# Patient Record
Sex: Male | Born: 1945 | Race: White | Hispanic: No | Smoking: Former smoker
Health system: Southern US, Community
[De-identification: ages and names within clinical notes are randomized; demographics above are authoritative.]

## PROBLEM LIST (undated history)

## (undated) DIAGNOSIS — I4891 Unspecified atrial fibrillation: Secondary | ICD-10-CM

## (undated) DIAGNOSIS — S065X9A Traumatic subdural hemorrhage with loss of consciousness of unspecified duration, initial encounter: Secondary | ICD-10-CM

## (undated) DIAGNOSIS — I639 Cerebral infarction, unspecified: Secondary | ICD-10-CM

## (undated) DIAGNOSIS — I10 Essential (primary) hypertension: Secondary | ICD-10-CM

## (undated) DIAGNOSIS — I609 Nontraumatic subarachnoid hemorrhage, unspecified: Secondary | ICD-10-CM

## (undated) DIAGNOSIS — M1990 Unspecified osteoarthritis, unspecified site: Secondary | ICD-10-CM

## (undated) DIAGNOSIS — R413 Other amnesia: Secondary | ICD-10-CM

## (undated) DIAGNOSIS — K219 Gastro-esophageal reflux disease without esophagitis: Secondary | ICD-10-CM

## (undated) DIAGNOSIS — U071 COVID-19: Secondary | ICD-10-CM

## (undated) DIAGNOSIS — I4892 Unspecified atrial flutter: Secondary | ICD-10-CM

## (undated) DIAGNOSIS — Z8619 Personal history of other infectious and parasitic diseases: Secondary | ICD-10-CM

## (undated) DIAGNOSIS — S065XAA Traumatic subdural hemorrhage with loss of consciousness status unknown, initial encounter: Secondary | ICD-10-CM

## (undated) DIAGNOSIS — E785 Hyperlipidemia, unspecified: Secondary | ICD-10-CM

## (undated) HISTORY — PX: APPENDECTOMY: SHX54

## (undated) HISTORY — DX: Unspecified osteoarthritis, unspecified site: M19.90

## (undated) HISTORY — DX: Hyperlipidemia, unspecified: E78.5

## (undated) HISTORY — DX: Other amnesia: R41.3

## (undated) HISTORY — PX: OTHER SURGICAL HISTORY: SHX169

## (undated) HISTORY — DX: Cerebral infarction, unspecified: I63.9

## (undated) HISTORY — DX: Personal history of other infectious and parasitic diseases: Z86.19

## (undated) HISTORY — DX: COVID-19: U07.1

## (undated) HISTORY — DX: Unspecified atrial fibrillation: I48.91

## (undated) HISTORY — DX: Gastro-esophageal reflux disease without esophagitis: K21.9

## (undated) HISTORY — DX: Essential (primary) hypertension: I10

---

## 2018-12-07 ENCOUNTER — Ambulatory Visit (INDEPENDENT_AMBULATORY_CARE_PROVIDER_SITE_OTHER): Payer: Medicare HMO | Admitting: Internal Medicine

## 2018-12-07 VITALS — BP 140/80 | HR 67 | Temp 97.6°F | Ht 66.0 in | Wt 198.8 lb

## 2018-12-07 DIAGNOSIS — I639 Cerebral infarction, unspecified: Secondary | ICD-10-CM | POA: Diagnosis not present

## 2018-12-07 DIAGNOSIS — E559 Vitamin D deficiency, unspecified: Secondary | ICD-10-CM | POA: Diagnosis not present

## 2018-12-07 DIAGNOSIS — Z125 Encounter for screening for malignant neoplasm of prostate: Secondary | ICD-10-CM | POA: Diagnosis not present

## 2018-12-07 DIAGNOSIS — Z1329 Encounter for screening for other suspected endocrine disorder: Secondary | ICD-10-CM

## 2018-12-07 DIAGNOSIS — I4891 Unspecified atrial fibrillation: Secondary | ICD-10-CM

## 2018-12-07 DIAGNOSIS — E785 Hyperlipidemia, unspecified: Secondary | ICD-10-CM

## 2018-12-07 DIAGNOSIS — I10 Essential (primary) hypertension: Secondary | ICD-10-CM

## 2018-12-07 DIAGNOSIS — Z1389 Encounter for screening for other disorder: Secondary | ICD-10-CM

## 2018-12-07 DIAGNOSIS — R413 Other amnesia: Secondary | ICD-10-CM

## 2018-12-07 DIAGNOSIS — Z1159 Encounter for screening for other viral diseases: Secondary | ICD-10-CM

## 2018-12-07 DIAGNOSIS — E538 Deficiency of other specified B group vitamins: Secondary | ICD-10-CM

## 2018-12-07 DIAGNOSIS — Z13818 Encounter for screening for other digestive system disorders: Secondary | ICD-10-CM

## 2018-12-07 MED ORDER — APIXABAN 5 MG PO TABS: 5.0000 mg | ORAL_TABLET | Freq: Two times a day (BID) | ORAL | 1 refills | Status: DC

## 2018-12-07 MED ORDER — LISINOPRIL 10 MG PO TABS: 10.0000 mg | ORAL_TABLET | Freq: Every day | ORAL | 1 refills | Status: DC

## 2018-12-07 MED ORDER — CARVEDILOL 25 MG PO TABS: 25.0000 mg | ORAL_TABLET | Freq: Two times a day (BID) | ORAL | 1 refills | Status: DC

## 2018-12-07 MED ORDER — ATORVASTATIN CALCIUM 10 MG PO TABS: 10.0000 mg | ORAL_TABLET | Freq: Every day | ORAL | 3 refills | Status: DC

## 2018-12-07 MED ORDER — FLECAINIDE ACETATE 100 MG PO TABS: 100.0000 mg | ORAL_TABLET | Freq: Two times a day (BID) | ORAL | 1 refills | Status: DC

## 2018-12-07 NOTE — Patient Instructions (Signed)
DASH Eating Plan  DASH stands for "Dietary Approaches to Stop Hypertension." The DASH eating plan is a healthy eating plan that has been shown to reduce high blood pressure (hypertension). It may also reduce your risk for type 2 diabetes, heart disease, and stroke. The DASH eating plan may also help with weight loss.  What are tips for following this plan?    General guidelines  · Avoid eating more than 2,300 mg (milligrams) of salt (sodium) a day. If you have hypertension, you may need to reduce your sodium intake to 1,500 mg a day.  · Limit alcohol intake to no more than 1 drink a day for nonpregnant women and 2 drinks a day for men. One drink equals 12 oz of beer, 5 oz of wine, or 1½ oz of hard liquor.  · Work with your health care provider to maintain a healthy body weight or to lose weight. Ask what an ideal weight is for you.  · Get at least 30 minutes of exercise that causes your heart to beat faster (aerobic exercise) most days of the week. Activities may include walking, swimming, or biking.  · Work with your health care provider or diet and nutrition specialist (dietitian) to adjust your eating plan to your individual calorie needs.  Reading food labels    · Check food labels for the amount of sodium per serving. Choose foods with less than 5 percent of the Daily Value of sodium. Generally, foods with less than 300 mg of sodium per serving fit into this eating plan.  · To find whole grains, look for the word "whole" as the first word in the ingredient list.  Shopping  · Buy products labeled as "low-sodium" or "no salt added."  · Buy fresh foods. Avoid canned foods and premade or frozen meals.  Cooking  · Avoid adding salt when cooking. Use salt-free seasonings or herbs instead of table salt or sea salt. Check with your health care provider or pharmacist before using salt substitutes.  · Do not fry foods. Cook foods using healthy methods such as baking, boiling, grilling, and broiling instead.  · Cook with  heart-healthy oils, such as olive, canola, soybean, or sunflower oil.  Meal planning  · Eat a balanced diet that includes:  ? 5 or more servings of fruits and vegetables each day. At each meal, try to fill half of your plate with fruits and vegetables.  ? Up to 6-8 servings of whole grains each day.  ? Less than 6 oz of lean meat, poultry, or fish each day. A 3-oz serving of meat is about the same size as a deck of cards. One egg equals 1 oz.  ? 2 servings of low-fat dairy each day.  ? A serving of nuts, seeds, or beans 5 times each week.  ? Heart-healthy fats. Healthy fats called Omega-3 fatty acids are found in foods such as flaxseeds and coldwater fish, like sardines, salmon, and mackerel.  · Limit how much you eat of the following:  ? Canned or prepackaged foods.  ? Food that is high in trans fat, such as fried foods.  ? Food that is high in saturated fat, such as fatty meat.  ? Sweets, desserts, sugary drinks, and other foods with added sugar.  ? Full-fat dairy products.  · Do not salt foods before eating.  · Try to eat at least 2 vegetarian meals each week.  · Eat more home-cooked food and less restaurant, buffet, and fast food.  ·   When eating at a restaurant, ask that your food be prepared with less salt or no salt, if possible.  What foods are recommended?  The items listed may not be a complete list. Talk with your dietitian about what dietary choices are best for you.  Grains  Whole-grain or whole-wheat bread. Whole-grain or whole-wheat pasta. Brown rice. Oatmeal. Quinoa. Bulgur. Whole-grain and low-sodium cereals. Pita bread. Low-fat, low-sodium crackers. Whole-wheat flour tortillas.  Vegetables  Fresh or frozen vegetables (raw, steamed, roasted, or grilled). Low-sodium or reduced-sodium tomato and vegetable juice. Low-sodium or reduced-sodium tomato sauce and tomato paste. Low-sodium or reduced-sodium canned vegetables.  Fruits  All fresh, dried, or frozen fruit. Canned fruit in natural juice (without  added sugar).  Meat and other protein foods  Skinless chicken or turkey. Ground chicken or turkey. Pork with fat trimmed off. Fish and seafood. Egg whites. Dried beans, peas, or lentils. Unsalted nuts, nut butters, and seeds. Unsalted canned beans. Lean cuts of beef with fat trimmed off. Low-sodium, lean deli meat.  Dairy  Low-fat (1%) or fat-free (skim) milk. Fat-free, low-fat, or reduced-fat cheeses. Nonfat, low-sodium ricotta or cottage cheese. Low-fat or nonfat yogurt. Low-fat, low-sodium cheese.  Fats and oils  Soft margarine without trans fats. Vegetable oil. Low-fat, reduced-fat, or light mayonnaise and salad dressings (reduced-sodium). Canola, safflower, olive, soybean, and sunflower oils. Avocado.  Seasoning and other foods  Herbs. Spices. Seasoning mixes without salt. Unsalted popcorn and pretzels. Fat-free sweets.  What foods are not recommended?  The items listed may not be a complete list. Talk with your dietitian about what dietary choices are best for you.  Grains  Baked goods made with fat, such as croissants, muffins, or some breads. Dry pasta or rice meal packs.  Vegetables  Creamed or fried vegetables. Vegetables in a cheese sauce. Regular canned vegetables (not low-sodium or reduced-sodium). Regular canned tomato sauce and paste (not low-sodium or reduced-sodium). Regular tomato and vegetable juice (not low-sodium or reduced-sodium). Pickles. Olives.  Fruits  Canned fruit in a light or heavy syrup. Fried fruit. Fruit in cream or butter sauce.  Meat and other protein foods  Fatty cuts of meat. Ribs. Fried meat. Bacon. Sausage. Bologna and other processed lunch meats. Salami. Fatback. Hotdogs. Bratwurst. Salted nuts and seeds. Canned beans with added salt. Canned or smoked fish. Whole eggs or egg yolks. Chicken or turkey with skin.  Dairy  Whole or 2% milk, cream, and half-and-half. Whole or full-fat cream cheese. Whole-fat or sweetened yogurt. Full-fat cheese. Nondairy creamers. Whipped toppings.  Processed cheese and cheese spreads.  Fats and oils  Butter. Stick margarine. Lard. Shortening. Ghee. Bacon fat. Tropical oils, such as coconut, palm kernel, or palm oil.  Seasoning and other foods  Salted popcorn and pretzels. Onion salt, garlic salt, seasoned salt, table salt, and sea salt. Worcestershire sauce. Tartar sauce. Barbecue sauce. Teriyaki sauce. Soy sauce, including reduced-sodium. Steak sauce. Canned and packaged gravies. Fish sauce. Oyster sauce. Cocktail sauce. Horseradish that you find on the shelf. Ketchup. Mustard. Meat flavorings and tenderizers. Bouillon cubes. Hot sauce and Tabasco sauce. Premade or packaged marinades. Premade or packaged taco seasonings. Relishes. Regular salad dressings.  Where to find more information:  · National Heart, Lung, and Blood Institute: www.nhlbi.nih.gov  · American Heart Association: www.heart.org  Summary  · The DASH eating plan is a healthy eating plan that has been shown to reduce high blood pressure (hypertension). It may also reduce your risk for type 2 diabetes, heart disease, and stroke.  · With the   DASH eating plan, you should limit salt (sodium) intake to 2,300 mg a day. If you have hypertension, you may need to reduce your sodium intake to 1,500 mg a day.  · When on the DASH eating plan, aim to eat more fresh fruits and vegetables, whole grains, lean proteins, low-fat dairy, and heart-healthy fats.  · Work with your health care provider or diet and nutrition specialist (dietitian) to adjust your eating plan to your individual calorie needs.  This information is not intended to replace advice given to you by your health care provider. Make sure you discuss any questions you have with your health care provider.  Document Released: 10/15/2011 Document Revised: 10/19/2016 Document Reviewed: 10/19/2016  Elsevier Interactive Patient Education © 2019 Elsevier Inc.        Hypertension  Hypertension, commonly called high blood pressure, is when the force of  blood pumping through the arteries is too strong. The arteries are the blood vessels that carry blood from the heart throughout the body. Hypertension forces the heart to work harder to pump blood and may cause arteries to become narrow or stiff. Having untreated or uncontrolled hypertension can cause heart attacks, strokes, kidney disease, and other problems.  A blood pressure reading consists of a higher number over a lower number. Ideally, your blood pressure should be below 120/80. The first ("top") number is called the systolic pressure. It is a measure of the pressure in your arteries as your heart beats. The second ("bottom") number is called the diastolic pressure. It is a measure of the pressure in your arteries as the heart relaxes.  What are the causes?  The cause of this condition is not known.  What increases the risk?  Some risk factors for high blood pressure are under your control. Others are not.  Factors you can change  · Smoking.  · Having type 2 diabetes mellitus, high cholesterol, or both.  · Not getting enough exercise or physical activity.  · Being overweight.  · Having too much fat, sugar, calories, or salt (sodium) in your diet.  · Drinking too much alcohol.  Factors that are difficult or impossible to change  · Having chronic kidney disease.  · Having a family history of high blood pressure.  · Age. Risk increases with age.  · Race. You may be at higher risk if you are African-American.  · Gender. Men are at higher risk than women before age 45. After age 65, women are at higher risk than men.  · Having obstructive sleep apnea.  · Stress.  What are the signs or symptoms?  Extremely high blood pressure (hypertensive crisis) may cause:  · Headache.  · Anxiety.  · Shortness of breath.  · Nosebleed.  · Nausea and vomiting.  · Severe chest pain.  · Jerky movements you cannot control (seizures).  How is this diagnosed?  This condition is diagnosed by measuring your blood pressure while you are  seated, with your arm resting on a surface. The cuff of the blood pressure monitor will be placed directly against the skin of your upper arm at the level of your heart. It should be measured at least twice using the same arm. Certain conditions can cause a difference in blood pressure between your right and left arms.  Certain factors can cause blood pressure readings to be lower or higher than normal (elevated) for a short period of time:  · When your blood pressure is higher when you are in a health care   provider's office than when you are at home, this is called white coat hypertension. Most people with this condition do not need medicines.  · When your blood pressure is higher at home than when you are in a health care provider's office, this is called masked hypertension. Most people with this condition may need medicines to control blood pressure.  If you have a high blood pressure reading during one visit or you have normal blood pressure with other risk factors:  · You may be asked to return on a different day to have your blood pressure checked again.  · You may be asked to monitor your blood pressure at home for 1 week or longer.  If you are diagnosed with hypertension, you may have other blood or imaging tests to help your health care provider understand your overall risk for other conditions.  How is this treated?  This condition is treated by making healthy lifestyle changes, such as eating healthy foods, exercising more, and reducing your alcohol intake. Your health care provider may prescribe medicine if lifestyle changes are not enough to get your blood pressure under control, and if:  · Your systolic blood pressure is above 130.  · Your diastolic blood pressure is above 80.  Your personal target blood pressure may vary depending on your medical conditions, your age, and other factors.  Follow these instructions at home:  Eating and drinking    · Eat a diet that is high in fiber and potassium, and  low in sodium, added sugar, and fat. An example eating plan is called the DASH (Dietary Approaches to Stop Hypertension) diet. To eat this way:  ? Eat plenty of fresh fruits and vegetables. Try to fill half of your plate at each meal with fruits and vegetables.  ? Eat whole grains, such as whole wheat pasta, brown rice, or whole grain bread. Fill about one quarter of your plate with whole grains.  ? Eat or drink low-fat dairy products, such as skim milk or low-fat yogurt.  ? Avoid fatty cuts of meat, processed or cured meats, and poultry with skin. Fill about one quarter of your plate with lean proteins, such as fish, chicken without skin, beans, eggs, and tofu.  ? Avoid premade and processed foods. These tend to be higher in sodium, added sugar, and fat.  · Reduce your daily sodium intake. Most people with hypertension should eat less than 1,500 mg of sodium a day.  · Limit alcohol intake to no more than 1 drink a day for nonpregnant women and 2 drinks a day for men. One drink equals 12 oz of beer, 5 oz of wine, or 1½ oz of hard liquor.  Lifestyle    · Work with your health care provider to maintain a healthy body weight or to lose weight. Ask what an ideal weight is for you.  · Get at least 30 minutes of exercise that causes your heart to beat faster (aerobic exercise) most days of the week. Activities may include walking, swimming, or biking.  · Include exercise to strengthen your muscles (resistance exercise), such as pilates or lifting weights, as part of your weekly exercise routine. Try to do these types of exercises for 30 minutes at least 3 days a week.  · Do not use any products that contain nicotine or tobacco, such as cigarettes and e-cigarettes. If you need help quitting, ask your health care provider.  · Monitor your blood pressure at home as told by your health   care provider.  · Keep all follow-up visits as told by your health care provider. This is important.  Medicines  · Take over-the-counter and  prescription medicines only as told by your health care provider. Follow directions carefully. Blood pressure medicines must be taken as prescribed.  · Do not skip doses of blood pressure medicine. Doing this puts you at risk for problems and can make the medicine less effective.  · Ask your health care provider about side effects or reactions to medicines that you should watch for.  Contact a health care provider if:  · You think you are having a reaction to a medicine you are taking.  · You have headaches that keep coming back (recurring).  · You feel dizzy.  · You have swelling in your ankles.  · You have trouble with your vision.  Get help right away if:  · You develop a severe headache or confusion.  · You have unusual weakness or numbness.  · You feel faint.  · You have severe pain in your chest or abdomen.  · You vomit repeatedly.  · You have trouble breathing.  Summary  · Hypertension is when the force of blood pumping through your arteries is too strong. If this condition is not controlled, it may put you at risk for serious complications.  · Your personal target blood pressure may vary depending on your medical conditions, your age, and other factors. For most people, a normal blood pressure is less than 120/80.  · Hypertension is treated with lifestyle changes, medicines, or a combination of both. Lifestyle changes include weight loss, eating a healthy, low-sodium diet, exercising more, and limiting alcohol.  This information is not intended to replace advice given to you by your health care provider. Make sure you discuss any questions you have with your health care provider.  Document Released: 10/26/2005 Document Revised: 09/23/2016 Document Reviewed: 09/23/2016  Elsevier Interactive Patient Education © 2019 Elsevier Inc.

## 2018-12-07 NOTE — Progress Notes (Signed)
Pre visit review using our clinic review tool, if applicable. No additional management support is needed unless otherwise documented below in the visit note. 

## 2018-12-07 NOTE — Progress Notes (Addendum)
Chief Complaint  Patient presents with  . Establish Care   [facility] no complaints today   1. Afib rate controlled pending appt cards   2. HTN/HLD on medications controlled   apixaban 5 MG Tabs tablet Commonly known as:  ELIQUIS Take 1 tablet (5 mg total) by mouth 2 (two) times daily.   atorvastatin 10 MG tablet Commonly known as:  LIPITOR Take 1 tablet (10 mg total) by mouth daily.   carvedilol 25 MG tablet Commonly known as:  COREG Take 1 tablet (25 mg total) by mouth 2 (two) times daily with a meal.   flecainide 100 MG tablet Commonly known as:  TAMBOCOR Take 1 tablet (100 mg total) by mouth 2 (two) times daily.   lisinopril 10 MG tablet Commonly known as:  PRINIVIL,ZESTRIL Take 1 tablet (10 mg total) by mouth daily.       3. H/o stroke does not currently f/u with neurology   Review of Systems  Constitutional: Negative for weight loss.  HENT: Negative for hearing loss.   Eyes: Negative for blurred vision.  Respiratory: Negative for shortness of breath.   Cardiovascular: Negative for chest pain.  Gastrointestinal: Negative for abdominal pain.  Musculoskeletal: Negative for falls.  Skin: Negative for rash.  Neurological: Negative for headaches.  Psychiatric/Behavioral: Negative for depression.   Past Medical History:  Diagnosis Date  . A-fib (HCC)   . Arthritis   . GERD (gastroesophageal reflux disease)   . History of chicken pox   . Hyperlipidemia   . Hypertension   . Loss of memory   . Stroke [facility](HCC)    ~2013/14   Past Surgical History:  Procedure Laterality Date  . APPENDECTOMY     Family History  Problem Relation Age of Onset  . Dementia Mother   . Cancer Father        lung smoker    Social History   Socioeconomic History  . Marital status: Widowed    Spouse name: Not on file  . Number of children: Not on file  . Years of education: Not on file  . Highest education level: Not on file  Occupational History  . Not on  file  Social Needs  . Financial resource strain: Not on file  . Food insecurity:    Worry: Not on file    Inability: Not on file  . Transportation needs:    Medical: Not on file    Non-medical: Not on file  Tobacco Use  . Smoking status: Former Games developermoker  . Smokeless tobacco: Never Used  Substance and Sexual Activity  . Alcohol use: Yes  . Drug use: Not Currently  . Sexual activity: Not on file  Lifestyle  . Physical activity:    Days per week: Not on file    Minutes per session: Not on file  . Stress: Not on file  Relationships  . Social connections:    Talks on phone: Not on file    Gets together: Not on file    Attends religious service: Not on file    Active member of club or organization: Not on file    Attends meetings of clubs or organizations: Not on file    Relationship status: Not on file  . Intimate partner violence:    Fear of current or ex partner: Not on file    Emotionally abused: Not on file    Physically abused: Not on file    Forced sexual activity: Not on file  Other Topics Concern  . Not on file  Social History Narrative   From [place]    Retired    Some college    Moved from [place] to be with son and grandkids    Widowed since ~2013/14    Owns guns    Wears seat belt    Safe in relationship    Current Meds  Medication Sig  . apixaban (ELIQUIS) 5 MG TABS tablet Take 1 tablet (5 mg total) by mouth 2 (two) times daily.  Marland Kitchen atorvastatin (LIPITOR) 10 MG tablet Take 1 tablet (10 mg total) by mouth daily.  . carvedilol (COREG) 25 MG tablet Take 1 tablet (25 mg total) by mouth 2 (two) times daily with a meal.  . flecainide (TAMBOCOR) 100 MG tablet Take 1 tablet (100 mg total) by mouth 2 (two) times daily.  Marland Kitchen lisinopril (PRINIVIL,ZESTRIL) 10 MG tablet Take 1 tablet (10 mg total) by mouth daily.  . [DISCONTINUED] apixaban (ELIQUIS) 5 MG TABS tablet Take by mouth 2 (two) times daily.   . [DISCONTINUED] atorvastatin (LIPITOR) 10 MG tablet Take 10 mg by  mouth daily.   . [DISCONTINUED] carvedilol (COREG) 25 MG tablet Take 25 mg by mouth 2 (two) times daily with a meal.   . [DISCONTINUED] flecainide (TAMBOCOR) 100 MG tablet Take 100 mg by mouth daily.   . [DISCONTINUED] lisinopril (PRINIVIL,ZESTRIL) 10 MG tablet Take 10 mg by mouth daily.    Not on File No results found for this or any previous visit (from the past 2160 hour(s)). Objective  Body mass index is 32.09 kg/m. Wt Readings from Last 3 Encounters:  12/07/18 198 lb 12.8 oz (90.2 kg)   Temp Readings from Last 3 Encounters:  12/07/18 97.6 F (36.4 C) (Oral)   BP Readings from Last 3 Encounters:  12/07/18 140/80   Pulse Readings from Last 3 Encounters:  12/07/18 67    Physical Exam Vitals signs and nursing note reviewed.  Constitutional:      Appearance: Normal appearance. He is well-developed and well-groomed. He is obese.  HENT:     Head: Normocephalic and atraumatic.     Nose: Nose normal.     Mouth/Throat:     Mouth: Mucous membranes are moist.     Pharynx: Oropharynx is clear.  Eyes:     Conjunctiva/sclera: Conjunctivae normal.     Pupils: Pupils are equal, round, and reactive to light.  Cardiovascular:     Rate and Rhythm: Normal rate and regular rhythm.     Heart sounds: Normal heart sounds. No murmur.     Comments: NSR today   Pulmonary:     Effort: Pulmonary effort is normal.     Breath sounds: Normal breath sounds.  Skin:    General: Skin is warm and dry.  Neurological:     General: No focal deficit present.     Mental Status: He is alert and oriented to person, place, and time. Mental status is at baseline.     Gait: Gait normal.  Psychiatric:        Attention and Perception: Attention and perception normal.        Mood and Affect: Mood and affect normal.        Speech: Speech normal.        Behavior: Behavior normal. Behavior is cooperative.        Thought Content: Thought content normal.        Cognition and Memory: Cognition and memory  normal.  Judgment: Judgment normal.     Assessment   1. 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 HTN/HLD  3. HM 4. H/o stroke with memory loss ? Related  Plan   1. F/u Dr. Mariah Milling upcoming 01/16/2019  2. Check fasting labs  He has another he takes prn not sure name of it Reviewed note 08/23/18 with Dr. Meta Hatchet II Day (PCP) he was on norvasc 5 mg qd  3.  Flu shot had [facility] in [place] 08/2018 per pt  Need to check pcp records :  Tdap Prevnar, pna 23 08/09/13  shingrix   Check fasting labs  Former smoker quit 17 years from 11/2018 smoked x 20 years max 1 ppd  Check PSA elevated with h/o elevation PSA was 5.6 08/2016, PSA 3.480 02/09/18  H/o prediabetes A1C 5.33 02/09/18  H/o human rhinovirus/enterovirus 08/23/18  Get records Dr. Meta Hatchet Day [place] PCP and Dr. Doyne Keel [phone] cardiology [place] [facility]  Refilled all medications  4. Consider neurology in future  Carotid US 10/20/17 negative no significant stenosis b/l  Transcranial doppler 09/21/17 patency of major basal intracranial arteries in circle of [place] no evidence of intracranial stenosis or occlusive disease  [place] 02/09/18 negative b/l legs  10/20/17 thyroid US unremarkable  Provider: Dr. French Ana McLean-Scocuzza-[facility] Medicine

## 2018-12-08 ENCOUNTER — Encounter: Payer: Self-pay | Admitting: Internal Medicine

## 2018-12-08 DIAGNOSIS — R413 Other amnesia: Secondary | ICD-10-CM | POA: Insufficient documentation

## 2018-12-08 DIAGNOSIS — I10 Essential (primary) hypertension: Secondary | ICD-10-CM | POA: Insufficient documentation

## 2018-12-08 DIAGNOSIS — I639 Cerebral infarction, unspecified: Secondary | ICD-10-CM | POA: Insufficient documentation

## 2018-12-08 DIAGNOSIS — I4891 Unspecified atrial fibrillation: Secondary | ICD-10-CM | POA: Insufficient documentation

## 2018-12-08 DIAGNOSIS — E785 Hyperlipidemia, unspecified: Secondary | ICD-10-CM | POA: Insufficient documentation

## 2018-12-08 HISTORY — DX: Other amnesia: R41.3

## 2018-12-08 HISTORY — DX: Cerebral infarction, unspecified: I63.9

## 2018-12-09 ENCOUNTER — Encounter: Payer: Self-pay | Admitting: Internal Medicine

## 2018-12-09 DIAGNOSIS — I4891 Unspecified atrial fibrillation: Secondary | ICD-10-CM

## 2018-12-09 DIAGNOSIS — I639 Cerebral infarction, unspecified: Secondary | ICD-10-CM

## 2018-12-09 DIAGNOSIS — I10 Essential (primary) hypertension: Secondary | ICD-10-CM

## 2018-12-09 MED ORDER — ATORVASTATIN CALCIUM 10 MG PO TABS: 10.0000 mg | ORAL_TABLET | Freq: Every day | ORAL | 3 refills | Status: DC

## 2018-12-09 MED ORDER — LISINOPRIL 10 MG PO TABS: 10.0000 mg | ORAL_TABLET | Freq: Every day | ORAL | 1 refills | Status: DC

## 2018-12-09 MED ORDER — FLECAINIDE ACETATE 100 MG PO TABS: 100.0000 mg | ORAL_TABLET | Freq: Two times a day (BID) | ORAL | 1 refills | Status: DC

## 2018-12-09 MED ORDER — CARVEDILOL 25 MG PO TABS: 25.0000 mg | ORAL_TABLET | Freq: Two times a day (BID) | ORAL | 1 refills | Status: DC

## 2018-12-09 MED ORDER — APIXABAN 5 MG PO TABS: 5.0000 mg | ORAL_TABLET | Freq: Two times a day (BID) | ORAL | 1 refills | Status: DC

## 2018-12-09 NOTE — Telephone Encounter (Signed)
Medication has been switched

## 2018-12-16 ENCOUNTER — Other Ambulatory Visit (INDEPENDENT_AMBULATORY_CARE_PROVIDER_SITE_OTHER): Payer: Medicare HMO

## 2018-12-16 DIAGNOSIS — E538 Deficiency of other specified B group vitamins: Secondary | ICD-10-CM

## 2018-12-16 DIAGNOSIS — E785 Hyperlipidemia, unspecified: Secondary | ICD-10-CM | POA: Diagnosis not present

## 2018-12-16 DIAGNOSIS — Z13818 Encounter for screening for other digestive system disorders: Secondary | ICD-10-CM | POA: Diagnosis not present

## 2018-12-16 DIAGNOSIS — I4891 Unspecified atrial fibrillation: Secondary | ICD-10-CM

## 2018-12-16 DIAGNOSIS — E559 Vitamin D deficiency, unspecified: Secondary | ICD-10-CM

## 2018-12-16 DIAGNOSIS — Z1389 Encounter for screening for other disorder: Secondary | ICD-10-CM

## 2018-12-16 DIAGNOSIS — Z125 Encounter for screening for malignant neoplasm of prostate: Secondary | ICD-10-CM | POA: Diagnosis not present

## 2018-12-16 DIAGNOSIS — I10 Essential (primary) hypertension: Secondary | ICD-10-CM

## 2018-12-16 DIAGNOSIS — Z1329 Encounter for screening for other suspected endocrine disorder: Secondary | ICD-10-CM

## 2018-12-16 DIAGNOSIS — Z1159 Encounter for screening for other viral diseases: Secondary | ICD-10-CM | POA: Diagnosis not present

## 2018-12-16 LAB — LIPID PANEL
Cholesterol: 150 mg/dL (ref 0–200)
HDL: 51.7 mg/dL (ref >39.00)
LDL Cholesterol: 82 mg/dL (ref 0–99)
NonHDL: 98.39
Total CHOL/HDL Ratio: 3
Triglycerides: 81 mg/dL (ref 0.0–149.0)
VLDL: 16.2 mg/dL (ref 0.0–40.0)

## 2018-12-16 LAB — CBC WITH DIFFERENTIAL/PLATELET
Basophils Absolute: 0.1 10*3/uL (ref 0.0–0.1)
Basophils Relative: 0.9 % (ref 0.0–3.0)
Eosinophils Absolute: 0.1 10*3/uL (ref 0.0–0.7)
Eosinophils Relative: 1.8 % (ref 0.0–5.0)
HCT: 48.2 % (ref 39.0–52.0)
Hemoglobin: 16.1 g/dL (ref 13.0–17.0)
Lymphocytes Relative: 25.8 % (ref 12.0–46.0)
Lymphs Abs: 1.9 10*3/uL (ref 0.7–4.0)
MCHC: 33.5 g/dL (ref 30.0–36.0)
MCV: 92 fl (ref 78.0–100.0)
MONO ABS: 0.7 10*3/uL (ref 0.1–1.0)
Monocytes Relative: 9.1 % (ref 3.0–12.0)
Neutro Abs: 4.6 10*3/uL (ref 1.4–7.7)
Neutrophils Relative %: 62.4 % (ref 43.0–77.0)
Platelets: 187 10*3/uL (ref 150.0–400.0)
RBC: 5.23 Mil/uL (ref 4.22–5.81)
RDW: 14 % (ref 11.5–15.5)
WBC: 7.4 10*3/uL (ref 4.0–10.5)

## 2018-12-16 LAB — COMPREHENSIVE METABOLIC PANEL
ALT: 26 U/L (ref 0–53)
AST: 20 U/L (ref 0–37)
Albumin: 4.3 g/dL (ref 3.5–5.2)
Alkaline Phosphatase: 87 U/L (ref 39–117)
BUN: 21 mg/dL (ref 6–23)
CHLORIDE: 100 meq/L (ref 96–112)
CO2: 31 meq/L (ref 19–32)
Calcium: 8.9 mg/dL (ref 8.4–10.5)
Creatinine, Ser: 1.02 mg/dL (ref 0.40–1.50)
GFR: 71.74 mL/min (ref >60.00)
Glucose, Bld: 87 mg/dL (ref 70–99)
Potassium: 4.5 mEq/L (ref 3.5–5.1)
SODIUM: 139 meq/L (ref 135–145)
Total Bilirubin: 0.9 mg/dL (ref 0.2–1.2)
Total Protein: 6.3 g/dL (ref 6.0–8.3)

## 2018-12-16 LAB — VITAMIN B12: Vitamin B-12: 516 pg/mL (ref 211–911)

## 2018-12-16 LAB — VITAMIN D 25 HYDROXY (VIT D DEFICIENCY, FRACTURES): VITD: 65.04 ng/mL (ref 30.00–100.00)

## 2018-12-16 LAB — TSH: TSH: 2.61 u[IU]/mL (ref 0.35–4.50)

## 2018-12-16 LAB — PSA, MEDICARE: PSA: 4.5 ng/mL — AB (ref 0.10–4.00)

## 2018-12-16 NOTE — Addendum Note (Signed)
Addended by: Penne Lash on: 12/16/2018 08:56 AM   Modules accepted: Orders

## 2018-12-17 LAB — URINALYSIS, ROUTINE W REFLEX MICROSCOPIC
Bilirubin, UA: NEGATIVE
Glucose, UA: NEGATIVE
Ketones, UA: NEGATIVE
Leukocytes, UA: NEGATIVE
Nitrite, UA: NEGATIVE
Protein, UA: NEGATIVE
RBC, UA: NEGATIVE
Specific Gravity, UA: 1.016 (ref 1.005–1.030)
Urobilinogen, Ur: 0.2 mg/dL (ref 0.2–1.0)
pH, UA: 7 (ref 5.0–7.5)

## 2018-12-19 ENCOUNTER — Encounter: Payer: Self-pay | Admitting: Internal Medicine

## 2018-12-19 LAB — HEPATITIS C ANTIBODY
Hepatitis C Ab: NONREACTIVE
SIGNAL TO CUT-OFF: 0.02 (ref <1.00)

## 2018-12-19 LAB — MEASLES/MUMPS/RUBELLA IMMUNITY
Mumps IgG: 162 AU/mL
Rubella: 8.66 index
Rubeola IgG: >300 AU/mL

## 2018-12-19 LAB — HEPATITIS B SURFACE ANTIBODY, QUANTITATIVE: Hep B S AB Quant (Post): 49 m[IU]/mL (ref >=10)

## 2018-12-19 LAB — HEPATITIS B SURFACE ANTIGEN: Hepatitis B Surface Ag: NONREACTIVE

## 2018-12-20 ENCOUNTER — Other Ambulatory Visit: Payer: Self-pay | Admitting: Internal Medicine

## 2018-12-20 DIAGNOSIS — R972 Elevated prostate specific antigen [PSA]: Secondary | ICD-10-CM

## 2019-01-03 DIAGNOSIS — H524 Presbyopia: Secondary | ICD-10-CM | POA: Diagnosis not present

## 2019-01-15 NOTE — Progress Notes (Signed)
Cardiology Office Note  Date:  01/16/2019   ID:  Peter Castro, DOB: 01-25-1946, MRN: [id number]  PCP:  McLean-Scocuzza, Pasty Spillers, MD   Chief Complaint  Patient presents with  . New Patient (Initial Visit)    self referral Dr. Jairo Ben [facility]. Patient denies chest pain and SOB at this time. Meds reviewed verbally with patient.     HPI:  Peter Castro is a 73 y.o. male with a history of: Atrial fibrillation (HCC) Essential hypertension Hyperlipidemia Cerebrovascular accident (CVA) [facility]) Memory loss Former smoker, quit 20 years ago  Who presents to the office today by referral from Dr. Quentin Ore  for his atrial fibrillation.  INTERVAL HISTORY: The patient reports today for an initial visit. He is here today with a self-referral. His previous providers are in [place], [place], Dr. Jairo Ben, MD and Dr. Meta Hatchet Day, MD. He moved to [facility] from [place], [place] a couple of months ago for his son. He used to have atrial fibrillation, but after the ablasion he has not gone back into it. Compliant with his with Eliquis, metoprolol, and flecainide.   He has a blood pressure monitor at home. At home it;s usually 120-125. He only takes amlodipine as needed. He takes lisinopril everyday.   He used to be a Emergency planning/management officer in [place], [place].   He regularly exercises by going to the gym everyday. He walks on a treadmill for one hour.  Former smoker, quit about 20 years ago.     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  Today's Blood pressure 140/90 Total Chol 150/ LDL 82 HBA1C 5.33  CR 1.02 Glucose 87  EKG personally reviewed by myself on todays visit Shows sinus bradycardia rhythm with 1st degree AV block. 58 bpm. Low voltage QRS.    OTHER PAST MEDICAL HISTORY REVIEWED BY ME FOR TODAY'S VISIT: Reviewed cards notes [place] last seen 10/20/17 had cryo ballon pulm vein isolation and antral substrate modifications with cavo tricuspid isthmus flutter  ablation 01/09/14 prior to this failed multiple cardioversions and failed antiarrhythmic meds last DCCV 12/12/13 then put on flecainide   Echo 08/18/13 LVEF 40-45 % mild sclerotic aortic valve, mild MR  PMH:   has a past medical history of A-fib (HCC), Arthritis, GERD (gastroesophageal reflux disease), History of chicken pox, Hyperlipidemia, Hypertension, Loss of memory, and Stroke (HCC).  PSH:    Past Surgical History:  Procedure Laterality Date  . APPENDECTOMY    . OTHER SURGICAL HISTORY     cryoballon pvi isthmus tricuspid ablation Dr. Kathrynn Ducking 01/2014 [place]     Current Outpatient Medications  Medication Sig Dispense Refill  . amLODipine (NORVASC) 5 MG tablet Take 1 tablet (5 mg total) by mouth daily as needed. 90 tablet 3  . apixaban (ELIQUIS) 5 MG TABS tablet Take 1 tablet (5 mg total) by mouth 2 (two) times daily. 60 tablet 1  . atorvastatin (LIPITOR) 10 MG tablet Take 1 tablet (10 mg total) by mouth daily. 90 tablet 3  . carvedilol (COREG) 25 MG tablet Take 1 tablet (25 mg total) by mouth 2 (two) times daily with a meal. 180 tablet 1  . flecainide (TAMBOCOR) 100 MG tablet Take 1 tablet (100 mg total) by mouth 2 (two) times daily. 60 tablet 1  . lisinopril (PRINIVIL,ZESTRIL) 10 MG tablet Take 1 tablet (10 mg total) by mouth daily. 90 tablet 1   No current facility-administered medications for this visit.  ALLERGIES:   Patient has no allergy information on record.   SOCIAL HISTORY:  The patient  reports that he has quit smoking. He has never used smokeless tobacco. He reports current alcohol use. He reports previous drug use.   FAMILY HISTORY:   family history includes Cancer in his father; Dementia in his mother.    REVIEW OF SYSTEMS: Review of Systems  Constitutional: Negative.   Eyes: Negative.   Respiratory: Negative.   Cardiovascular: Negative.  Negative for leg swelling.  Gastrointestinal: Negative.   Genitourinary: Negative.   Musculoskeletal: Negative.    Neurological: Negative.   Psychiatric/Behavioral: Negative.   All other systems reviewed and are negative.    PHYSICAL EXAM: VS:  BP 140/90 (BP Location: Right Arm, Patient Position: Sitting, Cuff Size: Normal)   Pulse (!) 58   Ht 5\' 5"  (1.651 m)   Wt 195 lb 12 oz (88.8 kg)   BMI 32.57 kg/m  , BMI Body mass index is 32.57 kg/m.  GEN: Well nourished, well developed, in no acute distress HEENT: normal Neck: no JVD, carotid bruits, or masses Cardiac: RRR; no murmurs, rubs, or gallops,no edema Respiratory:  clear to auscultation bilaterally, normal work of breathing GI: soft, nontender, nondistended, + BS MS: no deformity or atrophy Skin: warm and dry, no rash Neuro:  Strength and sensation are intact Psych: euthymic mood, full affect   RECENT LABS: 12/16/2018: ALT 26; BUN 21; Creatinine, Ser 1.02; Hemoglobin 16.1; Platelets 187.0; Potassium 4.5; Sodium 139; TSH 2.61    LIPID PANEL: Lab Results  Component Value Date   CHOL 150 12/16/2018   HDL 51.70 12/16/2018   LDLCALC 82 12/16/2018   TRIG 81.0 12/16/2018      WEIGHT: Wt Readings from Last 3 Encounters:  01/16/19 195 lb 12 oz (88.8 kg)  12/07/18 198 lb 12.8 oz (90.2 kg)       ASSESSMENT AND PLAN:  Paroxysmal atrial fibrillation (HCC) - Plan: EKG 12-Lead Maintaining normal sinus rhythm Recommend he stay on anticoagulation, but cannot beta-blocker  Essential hypertension Reports blood pressure well controlled at home in the 120 systolic range No changes to his medications He reports that he only takes amlodipine as needed not on a regular basis  Cerebrovascular accident (CVA), unspecified mechanism (HCC) On anticoagulation History of atrial fibrillation  Mixed hyperlipidemia Lipid panel reasonable, will continue statin   Disposition:   F/U  12 months  Total encounter time more than 45 minutes. Greater than 50% was spent in counseling and coordination of care with the patient.    Orders Placed This  Encounter  Procedures  . EKG 12-Lead    I, Jesus Reyes am acting as a scribe for Julien Nordmann, M.D., Ph.D.  I, Julien Nordmann, M.D. Ph.D., have reviewed the above documentation for accuracy and completeness, and I agree with the above. Her  Signed, Dossie Arbour, M.D., Ph.D. 01/16/2019  [facility] [place], [place] [phone]

## 2019-01-16 ENCOUNTER — Ambulatory Visit: Payer: Self-pay | Admitting: Cardiovascular Disease

## 2019-01-16 ENCOUNTER — Ambulatory Visit: Payer: Medicare HMO | Admitting: Cardiovascular Disease

## 2019-01-16 ENCOUNTER — Encounter: Payer: Self-pay | Admitting: Cardiovascular Disease

## 2019-01-16 VITALS — BP 140/90 | HR 58 | Ht 65.0 in | Wt 195.8 lb

## 2019-01-16 DIAGNOSIS — I10 Essential (primary) hypertension: Secondary | ICD-10-CM

## 2019-01-16 DIAGNOSIS — I480 Paroxysmal atrial fibrillation: Secondary | ICD-10-CM

## 2019-01-16 DIAGNOSIS — I639 Cerebral infarction, unspecified: Secondary | ICD-10-CM

## 2019-01-16 DIAGNOSIS — E782 Mixed hyperlipidemia: Secondary | ICD-10-CM | POA: Diagnosis not present

## 2019-01-16 MED ORDER — AMLODIPINE BESYLATE 5 MG PO TABS: 5.0000 mg | ORAL_TABLET | Freq: Every day | ORAL | 3 refills | Status: DC | PRN

## 2019-01-16 NOTE — Patient Instructions (Signed)

## 2019-01-18 ENCOUNTER — Other Ambulatory Visit: Payer: Self-pay

## 2019-01-18 ENCOUNTER — Encounter: Payer: Self-pay | Admitting: Urology

## 2019-01-18 ENCOUNTER — Ambulatory Visit: Payer: Medicare HMO | Admitting: Urology

## 2019-01-18 VITALS — BP 122/72 | HR 60 | Ht 65.0 in | Wt 185.0 lb

## 2019-01-18 DIAGNOSIS — Z125 Encounter for screening for malignant neoplasm of prostate: Secondary | ICD-10-CM

## 2019-01-18 NOTE — Patient Instructions (Signed)

## 2019-01-18 NOTE — Progress Notes (Signed)
01/18/2019 10:37 AM   Peter Castro 1946-05-03 [id number]  Referring provider: McLean-Scocuzza, Pasty Spillers, MD [address] [place], [place] [address]  CC: PSA screening  HPI: I saw Peter Castro in urology clinic today in consultation for PSA screening from Dr. Judie Grieve.  He is a 73 year old male who recently moved to the area from [place] with past medical history notable for atrial fibrillation(on Eliquis), stroke, and hypertension who was found to have a PSA of 4.5 on routine screening.  There is no family history of prostate cancer.  He denies any significant urinary symptoms.  He denies any gross hematuria, bone pain, or weight loss.  There are no aggravating or alleviating factors.  He denies any prior PSA screening, denies prior prostate biopsy.  Past surgical history is notable for open appendectomy as a child.   PMH: Past Medical History:  Diagnosis Date  . A-fib (HCC)   . Arthritis   . GERD (gastroesophageal reflux disease)   . History of chicken pox   . Hyperlipidemia   . Hypertension   . Loss of memory   . Stroke [facility])    ~2013/14    Surgical History: Past Surgical History:  Procedure Laterality Date  . APPENDECTOMY    . OTHER SURGICAL HISTORY     cryoballon pvi isthmus tricuspid ablation Dr. Kathrynn Ducking 01/2014 [place]     Allergies: No Known Allergies  Family History: Family History  Problem Relation Age of Onset  . Dementia Mother   . Cancer Father        lung smoker     Social History:  reports that he has quit smoking. He has never used smokeless tobacco. He reports current alcohol use. He reports previous drug use.  ROS: Please see flowsheet from today's date for complete review of systems.  Physical Exam: BP 122/72   Pulse 60   Ht 5\' 5"  (1.651 m)   Wt 185 lb (83.9 kg)   BMI 30.79 kg/m    Constitutional:  Alert and oriented, No acute distress. Cardiovascular: No clubbing, cyanosis, or edema. Respiratory: Normal  respiratory effort, no increased work of breathing. GI: Abdomen is soft, nontender, nondistended, no abdominal masses GU: No CVA tenderness DRE: Patient refused Lymph: No cervical or inguinal lymphadenopathy. Skin: No rashes, bruises or suspicious lesions. Neurologic: Grossly intact, no focal deficits, moving all 4 extremities. Psychiatric: Normal mood and affect.  Laboratory Data: PSA 4.5, no prior values  Pertinent Imaging: None to review  Assessment & Plan:   In summary, the patient is a 73 year old male with history of stroke and atrial fibrillation on anticoagulation who presents for evaluation of PSA of 4.5, which is within the normal range for his age.  He has no family history of prostate cancer, and refused DRE in clinic today.  We reviewed the controversy around PSA screening and the uncertainty surrounding it. In general, a man's PSA increases with age and is produced by both normal and cancerous prostate tissue. The differential diagnosis for elevated PSA includes BPH, prostate cancer, infection, recent intercourse/ejaculation, recent urethroscopic manipulation (foley placement/cystoscopy) or trauma, and prostatitis.   We reviewed the normal PSA range for patients 70-79 is below 6.5.  I specifically reviewed the AUA guidelines that do not recommend routine screening in patients over age 63.  We discussed the risks and benefits of screening at length, and written information was provided.  With his normal PSA value for his age, and age outside recommended screening, I recommended no further PSA screening  or further work-up.  Patient is in agreement.  RTC PRN  Peter Come, MD  [facility] [address] [place], [place] [address] [phone]

## 2019-01-25 ENCOUNTER — Encounter: Payer: Self-pay | Admitting: Internal Medicine

## 2019-01-25 ENCOUNTER — Other Ambulatory Visit: Payer: Self-pay

## 2019-01-25 ENCOUNTER — Ambulatory Visit (INDEPENDENT_AMBULATORY_CARE_PROVIDER_SITE_OTHER): Payer: Medicare HMO | Admitting: Internal Medicine

## 2019-01-25 VITALS — BP 112/70 | HR 64 | Temp 98.3°F | Ht 65.0 in | Wt 196.0 lb

## 2019-01-25 DIAGNOSIS — I4891 Unspecified atrial fibrillation: Secondary | ICD-10-CM | POA: Diagnosis not present

## 2019-01-25 DIAGNOSIS — I10 Essential (primary) hypertension: Secondary | ICD-10-CM

## 2019-01-25 DIAGNOSIS — R972 Elevated prostate specific antigen [PSA]: Secondary | ICD-10-CM | POA: Diagnosis not present

## 2019-01-25 NOTE — Progress Notes (Signed)
Chief Complaint  Patient presents with  . Follow-up   F/u doing well  1. HTN controlled takes norvasc 5 mg qd prn if BP>140 sbp, on coreg 25 mg bid and lis 10 mg qd  2. Afib controlled f/u cards in 1 years  3. Elevated PSA saw urology who does not rec further testing    Review of Systems  Constitutional: Negative for weight loss.  HENT: Negative for hearing loss.   Eyes: Negative for blurred vision.  Respiratory: Negative for shortness of breath.   Cardiovascular: Negative for chest pain.  Gastrointestinal: Negative for abdominal pain.  Genitourinary:       Nocturia 2-3 x per night   Skin: Negative for rash.  Neurological: Negative for headaches.  Psychiatric/Behavioral: Negative for depression.   Past Medical History:  Diagnosis Date  . A-fib (HCC)   . Arthritis   . GERD (gastroesophageal reflux disease)   . History of chicken pox   . Hyperlipidemia   . Hypertension   . Loss of memory   . Stroke [facility])    ~2013/14   Past Surgical History:  Procedure Laterality Date  . APPENDECTOMY    . OTHER SURGICAL HISTORY     cryoballon pvi isthmus tricuspid ablation Dr. Kathrynn Ducking 01/2014 [place]    Family History  Problem Relation Age of Onset  . Dementia Mother   . Cancer Father        lung smoker    Social History   Socioeconomic History  . Marital status: Widowed    Spouse name: Not on file  . Number of children: Not on file  . Years of education: Not on file  . Highest education level: Not on file  Occupational History  . Not on file  Social Needs  . Financial resource strain: Not on file  . Food insecurity:    Worry: Not on file    Inability: Not on file  . Transportation needs:    Medical: Not on file    Non-medical: Not on file  Tobacco Use  . Smoking status: Former Games developer  . Smokeless tobacco: Never Used  Substance and Sexual Activity  . Alcohol use: Yes  . Drug use: Not Currently  . Sexual activity: Not on file  Lifestyle  . Physical activity:   Days per week: Not on file    Minutes per session: Not on file  . Stress: Not on file  Relationships  . Social connections:    Talks on phone: Not on file    Gets together: Not on file    Attends religious service: Not on file    Active member of club or organization: Not on file    Attends meetings of clubs or organizations: Not on file    Relationship status: Not on file  . Intimate partner violence:    Fear of current or ex partner: Not on file    Emotionally abused: Not on file    Physically abused: Not on file    Forced sexual activity: Not on file  Other Topics Concern  . Not on file  Social History Narrative   From [place]    Retired    Some college    Moved from [place] to be with son and grandkids    Widowed since ~2013/14    Owns guns    Wears seat belt    Safe in relationship    Current Meds  Medication Sig  . amLODipine (NORVASC) 5 MG tablet Take 1  tablet (5 mg total) by mouth daily as needed.  Marland Kitchen apixaban (ELIQUIS) 5 MG TABS tablet Take 1 tablet (5 mg total) by mouth 2 (two) times daily.  Marland Kitchen atorvastatin (LIPITOR) 10 MG tablet Take 1 tablet (10 mg total) by mouth daily.  . carvedilol (COREG) 25 MG tablet Take 1 tablet (25 mg total) by mouth 2 (two) times daily with a meal.  . flecainide (TAMBOCOR) 100 MG tablet Take 1 tablet (100 mg total) by mouth 2 (two) times daily.  Marland Kitchen lisinopril (PRINIVIL,ZESTRIL) 10 MG tablet Take 1 tablet (10 mg total) by mouth daily.   No Known Allergies Recent Results (from the past 2160 hour(s))  B12     Status: None   Collection Time: 12/16/18  8:57 AM  Result Value Ref Range   Vitamin B-12 516 211 - 911 pg/mL  Vitamin D (25 hydroxy)     Status: None   Collection Time: 12/16/18  8:57 AM  Result Value Ref Range   VITD 65.04 30.00 - 100.00 ng/mL  PSA, Medicare     Status: Abnormal   Collection Time: 12/16/18  8:57 AM  Result Value Ref Range   PSA 4.50 (H) 0.10 - 4.00 ng/ml    Comment: Test performed using Access Hybritech PSA  Assay, a parmagnetic partical, chemiluminecent immunoassay.  TSH     Status: None   Collection Time: 12/16/18  8:57 AM  Result Value Ref Range   TSH 2.61 0.35 - 4.50 uIU/mL  Lipid panel     Status: None   Collection Time: 12/16/18  8:57 AM  Result Value Ref Range   Cholesterol 150 0 - 200 mg/dL    Comment: ATP III Classification       Desirable:  < 200 mg/dL               Borderline High:  200 - 239 mg/dL          High:  > = 161 mg/dL   Triglycerides 09.6 0.0 - 149.0 mg/dL    Comment: Normal:  <045 mg/dLBorderline High:  150 - 199 mg/dL   HDL 40.98 >11.91 mg/dL   VLDL 47.8 0.0 - 29.5 mg/dL   LDL Cholesterol 82 0 - 99 mg/dL   Total CHOL/HDL Ratio 3     Comment:                Men          Women1/2 Average Risk     3.4          3.3Average Risk          5.0          4.42X Average Risk          9.6          7.13X Average Risk          15.0          11.0                       NonHDL 98.39     Comment: NOTE:  Non-HDL goal should be 30 mg/dL higher than patient's LDL goal (i.e. LDL goal of < 70 mg/dL, would have non-HDL goal of < 100 mg/dL)  CBC with Differential/Platelet     Status: None   Collection Time: 12/16/18  8:57 AM  Result Value Ref Range   WBC 7.4 4.0 - 10.5 K/uL   RBC 5.23 4.22 - 5.81 Mil/uL   Hemoglobin  16.1 13.0 - 17.0 g/dL   HCT 96.0 45.4 - 09.8 %   MCV 92.0 78.0 - 100.0 fl   MCHC 33.5 30.0 - 36.0 g/dL   RDW 11.9 14.7 - 82.9 %   Platelets 187.0 150.0 - 400.0 K/uL   Neutrophils Relative % 62.4 43.0 - 77.0 %   Lymphocytes Relative 25.8 12.0 - 46.0 %   Monocytes Relative 9.1 3.0 - 12.0 %   Eosinophils Relative 1.8 0.0 - 5.0 %   Basophils Relative 0.9 0.0 - 3.0 %   Neutro Abs 4.6 1.4 - 7.7 K/uL   Lymphs Abs 1.9 0.7 - 4.0 K/uL   Monocytes Absolute 0.7 0.1 - 1.0 K/uL   Eosinophils Absolute 0.1 0.0 - 0.7 K/uL   Basophils Absolute 0.1 0.0 - 0.1 K/uL  Comprehensive metabolic panel     Status: None   Collection Time: 12/16/18  8:57 AM  Result Value Ref Range   Sodium 139 135  - 145 mEq/L   Potassium 4.5 3.5 - 5.1 mEq/L   Chloride 100 96 - 112 mEq/L   CO2 31 19 - 32 mEq/L   Glucose, Bld 87 70 - 99 mg/dL   BUN 21 6 - 23 mg/dL   Creatinine, Ser 5.62 0.40 - 1.50 mg/dL   Total Bilirubin 0.9 0.2 - 1.2 mg/dL   Alkaline Phosphatase 87 39 - 117 U/L   AST 20 0 - 37 U/L   ALT 26 0 - 53 U/L   Total Protein 6.3 6.0 - 8.3 g/dL   Albumin 4.3 3.5 - 5.2 g/dL   Calcium 8.9 8.4 - 13.0 mg/dL   GFR 86.57 >84.69 mL/min  Hepatitis B surface antibody,quantitative     Status: None   Collection Time: 12/16/18  8:57 AM  Result Value Ref Range   Hepatitis B-Post 49 > OR = 10 mIU/mL    Comment: . Patient has immunity to hepatitis B virus. . For additional information, please refer to http://education.[facility].com/faq/FAQ105 (This link is being provided for informational/ educational purposes only).   Hepatitis B surface antigen     Status: None   Collection Time: 12/16/18  8:57 AM  Result Value Ref Range   Hepatitis B Surface Ag NON-REACTIVE NON-REACTI  Hepatitis C antibody     Status: None   Collection Time: 12/16/18  8:57 AM  Result Value Ref Range   Hepatitis C Ab NON-REACTIVE NON-REACTI   SIGNAL TO CUT-OFF 0.02 <1.00    Comment: . HCV antibody was non-reactive. There is no laboratory  evidence of HCV infection. . In most cases, no further action is required. However, if recent HCV exposure is suspected, a test for HCV RNA (test code 62952) is suggested. . For additional information please refer to http://education.[facility].com/faq/FAQ22v1 (This link is being provided for informational/ educational purposes only.) .   Measles/Mumps/Rubella Immunity     Status: None   Collection Time: 12/16/18  8:57 AM  Result Value Ref Range   Rubeola IgG >300.00 AU/mL    Comment: AU/mL            Interpretation -----            -------------- <13.50           Negative 13.50-16.49      Equivocal >16.49           Positive . A positive result indicates that  the patient has antibody to measles virus. It does not differentiate  between an active or past infection. The clinical  diagnosis must  be interpreted in conjunction with  clinical signs and symptoms of the patient.    Mumps IgG 162.00 AU/mL    Comment:  AU/mL           Interpretation -------         ---------------- <9.00             Negative 9.00-10.99        Equivocal >10.99            Positive A positive result indicates that the patient has  antibody to mumps virus. It does not differentiate between an  active or past infection. The clinical diagnosis must be interpreted in conjunction with clinical signs and symptoms of the patient. .    Rubella 8.66 index    Comment:     Index            Interpretation     -----            --------------       <0.90            Not consistent with Immunity     0.90-0.99        Equivocal     > or = 1.00      Consistent with Immunity  . The presence of rubella IgG antibody suggests  immunization or past or current infection with rubella virus.   Urinalysis, Routine w reflex microscopic     Status: None   Collection Time: 12/16/18  8:57 AM  Result Value Ref Range   Specific Gravity, UA 1.016 1.005 - 1.030   pH, UA 7.0 5.0 - 7.5   Color, UA Yellow Yellow   Appearance Ur Clear Clear   Leukocytes, UA Negative Negative   Protein, UA Negative Negative/Trace   Glucose, UA Negative Negative   Ketones, UA Negative Negative   RBC, UA Negative Negative   Bilirubin, UA Negative Negative   Urobilinogen, Ur 0.2 0.2 - 1.0 mg/dL   Nitrite, UA Negative Negative   Microscopic Examination Comment     Comment: Microscopic not indicated and not performed.   Objective  Body mass index is 32.62 kg/m. Wt Readings from Last 3 Encounters:  01/25/19 196 lb (88.9 kg)  01/18/19 185 lb (83.9 kg)  01/16/19 195 lb 12 oz (88.8 kg)   Temp Readings from Last 3 Encounters:  01/25/19 98.3 F (36.8 C) (Oral)  12/07/18 97.6 F (36.4 C) (Oral)   BP Readings  from Last 3 Encounters:  01/25/19 112/70  01/18/19 122/72  01/16/19 140/90   Pulse Readings from Last 3 Encounters:  01/25/19 64  01/18/19 60  01/16/19 (!) 58    Physical Exam Vitals signs and nursing note reviewed.  Constitutional:      Appearance: Normal appearance. He is well-developed and well-groomed.  HENT:     Head: Normocephalic and atraumatic.     Nose: Nose normal.     Mouth/Throat:     Mouth: Mucous membranes are moist.     Pharynx: Oropharynx is clear.  Eyes:     Conjunctiva/sclera: Conjunctivae normal.     Pupils: Pupils are equal, round, and reactive to light.  Cardiovascular:     Rate and Rhythm: Normal rate and regular rhythm.     Heart sounds: Normal heart sounds. No murmur.     Comments: NSR Pulmonary:     Effort: Pulmonary effort is normal.     Breath sounds: Normal breath sounds.  Skin:    General: Skin is warm  and dry.  Neurological:     General: No focal deficit present.     Mental Status: He is alert and oriented to person, place, and time. Mental status is at baseline.     Gait: Gait normal.  Psychiatric:        Attention and Perception: Attention and perception normal.        Mood and Affect: Mood and affect normal.        Speech: Speech normal.        Behavior: Behavior normal. Behavior is cooperative.        Thought Content: Thought content normal.        Cognition and Memory: Cognition and memory normal.        Judgment: Judgment normal.     Assessment   1. HTN 2. Afib controlled in NSR  3. Elevated PSA  4. HM Plan   1.  Cont meds  2. F/u cards in 1 year  3. Urology rec no further testing  4.  Flu shot had [facility] [place] 08/2018 per pt  Need to check pcp records :  Tdap Prevnar, pna 23 08/09/13  shingrix    Former smoker quit 17 years from 11/2018 smoked x 20 years max 1 ppd  Check PSA elevated with h/o elevation PSA was 5.6 08/2016, PSA 3.480 02/09/18  H/o prediabetes A1C 5.33 02/09/18  H/o human rhinovirus/enterovirus  08/23/18  Get records Dr. Meta Hatchet Day [place] PCP and Dr. Doyne Keel [phone] cardiology [place]  Refilled all medications   Provider: Dr. French Ana McLean-Scocuzza-Internal Medicine

## 2019-01-25 NOTE — Patient Instructions (Addendum)
Call Dr. Mariah Milling when you run out of Eliquis or Flecainide  [phone]  Varicose Veins Varicose veins are veins that have become enlarged, bulged, and twisted. They most often appear in the legs. What are the causes? This condition is caused by damage to the valves in the vein. These valves help blood return to your heart. When they are damaged and they stop working properly, blood may flow backward and back up in the veins near the skin, causing the veins to get larger and appear twisted. The condition can result from any issue that causes blood to back up, like pregnancy, prolonged standing, or obesity. What increases the risk? This condition is more likely to develop in people who are:  On their feet a lot.  Pregnant.  Overweight. What are the signs or symptoms? Symptoms of this condition include:  Bulging, twisted, and bluish veins.  A feeling of heaviness. This may be worse at the end of the day.  Leg pain. This may be worse at the end of the day.  Swelling in the leg.  Changes in skin color over the veins. How is this diagnosed? This condition may be diagnosed based on your symptoms, a physical exam, and an ultrasound test. How is this treated? Treatment for this condition may involve:  Avoiding sitting or standing in one position for long periods of time.  Wearing compression stockings. These stockings help to prevent blood clots and reduce swelling in the legs.  Raising (elevating) the legs when resting.  Losing weight.  Exercising regularly. If you have persistent symptoms or want to improve the way your varicose veins look, you may choose to have a procedure to close the varicose veins off or to remove them. Treatments to close off the veins include:  Sclerotherapy. In this treatment, a solution is injected into a vein to close it off.  Laser treatment. In this treatment, the vein is heated with a laser to close it off.  Radiofrequency vein ablation. In  this treatment, an electrical current produced by radio waves is used to close off the vein. Treatments to remove the veins include:  Phlebectomy. In this treatment, the veins are removed through small incisions made over the veins.  Vein ligation and stripping. In this treatment, incisions are made over the veins. The veins are then removed after being tied (ligated) with stitches (sutures). Follow these instructions at home: Activity  Walk as much as possible. Walking increases blood flow. This helps blood return to the heart and takes pressure off your veins. It also increases your cardiovascular strength.  Follow your health care provider's instructions about exercising.  Do not stand or sit in one position for a long period of time.  Do not sit with your legs crossed.  Rest with your legs raised during the day. General instructions   Follow any diet instructions given to you by your health care provider.  Wear compression stockings as directed by your health care provider. Do not wear other kinds of tight clothing around your legs, pelvis, or waist.  Elevate your legs at night to above the level of your heart.  If you get a cut in the skin over the varicose vein and the vein bleeds: ? Lie down with your leg raised. ? Apply firm pressure to the cut with a clean cloth until the bleeding stops. ? Place a bandage (dressing) on the cut. Contact a health care provider if:  The skin around your varicose veins starts to break  down.  You have pain, redness, tenderness, or hard swelling over a vein.  You are uncomfortable because of pain.  You get a cut in the skin over a varicose vein and it will not stop bleeding. Summary  Varicose veins are veins that have become enlarged, bulged, and twisted. They most often appear in the legs.  This condition is caused by damage to the valves in the vein. These valves help blood return to your heart.  Treatment for this condition  includes frequent movements, wearing compression stockings, losing weight, and exercising regularly. In some cases, procedures are done to close off or remove the veins.  Treatment for this condition may include wearing compression stockings, elevating the legs, losing weight, and engaging in regular activity. In some cases, procedures are done to close off or remove the veins. This information is not intended to replace advice given to you by your health care provider. Make sure you discuss any questions you have with your health care provider. Document Released: 08/05/2005 Document Revised: 11/18/2016 Document Reviewed: 11/18/2016 Elsevier Interactive Patient Education  2019 [employer].

## 2019-01-25 NOTE — Progress Notes (Signed)
Pre visit review using our clinic review tool, if applicable. No additional management support is needed unless otherwise documented below in the visit note. 

## 2019-03-08 ENCOUNTER — Telehealth: Payer: Self-pay | Admitting: Cardiovascular Disease

## 2019-03-08 DIAGNOSIS — I4891 Unspecified atrial fibrillation: Secondary | ICD-10-CM

## 2019-03-08 MED ORDER — APIXABAN 5 MG PO TABS: 5.0000 mg | ORAL_TABLET | Freq: Two times a day (BID) | ORAL | 3 refills | Status: DC

## 2019-03-08 MED ORDER — FLECAINIDE ACETATE 100 MG PO TABS: 100.0000 mg | ORAL_TABLET | Freq: Two times a day (BID) | ORAL | 3 refills | Status: DC

## 2019-03-08 NOTE — Telephone Encounter (Signed)
°*  STAT* If patient is at the pharmacy, call can be transferred to refill team.   1. Which medications need to be refilled? (please list name of each medication and dose if known) All medications  2. Which pharmacy/location (including street and city if local pharmacy) is medication to be sent to? [facility] [place]   3. Do they need a 30 day or 90 day supply? 90   [facility] came to [facility] mall to get to office for mediation discussion .  Would appreciate a call back to discuss

## 2019-03-08 NOTE — Telephone Encounter (Signed)
Patient would like someone to call him to discuss his medications  I will refill medications after nurse reaches patients.

## 2019-03-08 NOTE — Telephone Encounter (Signed)
I spoke with the patient. He was needing his Eliquis 5 mg BID & Flecainide 100 mg BID refilled for a 90-day supply to the [facility].  I advised the patient I will be glad to send these medications in for him.

## 2019-08-03 ENCOUNTER — Ambulatory Visit (INDEPENDENT_AMBULATORY_CARE_PROVIDER_SITE_OTHER): Payer: Medicare HMO

## 2019-08-03 ENCOUNTER — Other Ambulatory Visit: Payer: Self-pay

## 2019-08-03 DIAGNOSIS — Z23 Encounter for immunization: Secondary | ICD-10-CM

## 2019-08-11 ENCOUNTER — Other Ambulatory Visit: Payer: Self-pay | Admitting: Internal Medicine

## 2019-08-11 DIAGNOSIS — I10 Essential (primary) hypertension: Secondary | ICD-10-CM

## 2019-08-11 DIAGNOSIS — I4891 Unspecified atrial fibrillation: Secondary | ICD-10-CM

## 2019-08-11 MED ORDER — CARVEDILOL 25 MG PO TABS: 25.0000 mg | ORAL_TABLET | Freq: Two times a day (BID) | ORAL | 1 refills | Status: DC

## 2019-08-11 MED ORDER — LISINOPRIL 10 MG PO TABS: 10.0000 mg | ORAL_TABLET | Freq: Every day | ORAL | 1 refills | Status: DC

## 2019-10-25 ENCOUNTER — Telehealth: Payer: Self-pay | Admitting: Internal Medicine

## 2019-10-25 NOTE — Telephone Encounter (Signed)
I called pt twice and left vm to call ofc. °

## 2019-10-27 ENCOUNTER — Ambulatory Visit: Payer: Medicare HMO | Admitting: Internal Medicine

## 2019-11-02 ENCOUNTER — Other Ambulatory Visit: Payer: Self-pay

## 2019-11-02 ENCOUNTER — Encounter: Payer: Self-pay | Admitting: Internal Medicine

## 2019-11-02 ENCOUNTER — Ambulatory Visit (INDEPENDENT_AMBULATORY_CARE_PROVIDER_SITE_OTHER): Payer: Medicare HMO | Admitting: Internal Medicine

## 2019-11-02 VITALS — Ht 65.0 in | Wt 190.0 lb

## 2019-11-02 DIAGNOSIS — I10 Essential (primary) hypertension: Secondary | ICD-10-CM

## 2019-11-02 DIAGNOSIS — I4891 Unspecified atrial fibrillation: Secondary | ICD-10-CM

## 2019-11-02 DIAGNOSIS — E782 Mixed hyperlipidemia: Secondary | ICD-10-CM

## 2019-11-02 DIAGNOSIS — I639 Cerebral infarction, unspecified: Secondary | ICD-10-CM

## 2019-11-02 DIAGNOSIS — Z1329 Encounter for screening for other suspected endocrine disorder: Secondary | ICD-10-CM

## 2019-11-02 DIAGNOSIS — Z1389 Encounter for screening for other disorder: Secondary | ICD-10-CM

## 2019-11-02 DIAGNOSIS — R972 Elevated prostate specific antigen [PSA]: Secondary | ICD-10-CM

## 2019-11-02 MED ORDER — LISINOPRIL 10 MG PO TABS: 10.0000 mg | ORAL_TABLET | Freq: Every day | ORAL | 3 refills | Status: DC

## 2019-11-02 MED ORDER — ATORVASTATIN CALCIUM 10 MG PO TABS: 10.0000 mg | ORAL_TABLET | Freq: Every day | ORAL | 3 refills | Status: DC

## 2019-11-02 NOTE — Patient Instructions (Signed)
Think about cologuard an at home stool tests which tests for blood, precancer and colon cancer  This is free and we do this test until age 73 and you do this at home  If the results are positive you will need to have a colonoscopy  It is about 90% accurate in detecting the above blood, precancer, colon cancer   We have scheduled you for labs 12/18/2019 at 8:30 am   DASH Eating Plan DASH stands for "Dietary Approaches to Stop Hypertension." The DASH eating plan is a healthy eating plan that has been shown to reduce high blood pressure (hypertension). It may also reduce your risk for type 2 diabetes, heart disease, and stroke. The DASH eating plan may also help with weight loss. What are tips for following this plan?  General guidelines  Avoid eating more than 2,300 mg (milligrams) of salt (sodium) a day. If you have hypertension, you may need to reduce your sodium intake to 1,500 mg a day.  Limit alcohol intake to no more than 1 drink a day for nonpregnant women and 2 drinks a day for men. One drink equals 12 oz of beer, 5 oz of wine, or 1 oz of hard liquor.  Work with your health care provider to maintain a healthy body weight or to lose weight. Ask what an ideal weight is for you.  Get at least 30 minutes of exercise that causes your heart to beat faster (aerobic exercise) most days of the week. Activities may include walking, swimming, or biking.  Work with your health care provider or diet and nutrition specialist (dietitian) to adjust your eating plan to your individual calorie needs. Reading food labels   Check food labels for the amount of sodium per serving. Choose foods with less than 5 percent of the Daily Value of sodium. Generally, foods with less than 300 mg of sodium per serving fit into this eating plan.  To find whole grains, look for the word "whole" as the first word in the ingredient list. Shopping  Buy products labeled as "low-sodium" or "no salt added."  Buy fresh  foods. Avoid canned foods and premade or frozen meals. Cooking  Avoid adding salt when cooking. Use salt-free seasonings or herbs instead of table salt or sea salt. Check with your health care provider or pharmacist before using salt substitutes.  Do not fry foods. Cook foods using healthy methods such as baking, boiling, grilling, and broiling instead.  Cook with heart-healthy oils, such as olive, canola, soybean, or sunflower oil. Meal planning  Eat a balanced diet that includes: ? 5 or more servings of fruits and vegetables each day. At each meal, try to fill half of your plate with fruits and vegetables. ? Up to 6-8 servings of whole grains each day. ? Less than 6 oz of lean meat, poultry, or fish each day. A 3-oz serving of meat is about the same size as a deck of cards. One egg equals 1 oz. ? 2 servings of low-fat dairy each day. ? A serving of nuts, seeds, or beans 5 times each week. ? Heart-healthy fats. Healthy fats called Omega-3 fatty acids are found in foods such as flaxseeds and coldwater fish, like sardines, salmon, and mackerel.  Limit how much you eat of the following: ? Canned or prepackaged foods. ? Food that is high in trans fat, such as fried foods. ? Food that is high in saturated fat, such as fatty meat. ? Sweets, desserts, sugary drinks, and other foods  with added sugar. ? Full-fat dairy products.  Do not salt foods before eating.  Try to eat at least 2 vegetarian meals each week.  Eat more home-cooked food and less restaurant, buffet, and fast food.  When eating at a restaurant, ask that your food be prepared with less salt or no salt, if possible. What foods are recommended? The items listed may not be a complete list. Talk with your dietitian about what dietary choices are best for you. Grains Whole-grain or whole-wheat bread. Whole-grain or whole-wheat pasta. Brown rice. [place]. Bulgur. Whole-grain and low-sodium cereals. Pita bread. Low-fat,  low-sodium crackers. Whole-wheat flour tortillas. Vegetables Fresh or frozen vegetables (raw, steamed, roasted, or grilled). Low-sodium or reduced-sodium tomato and vegetable juice. Low-sodium or reduced-sodium tomato sauce and tomato paste. Low-sodium or reduced-sodium canned vegetables. Fruits All fresh, dried, or frozen fruit. Canned fruit in natural juice (without added sugar). Meat and other protein foods Skinless chicken or [place]. Ground chicken or [place]. Pork with fat trimmed off. Fish and seafood. Egg whites. Dried beans, peas, or lentils. Unsalted nuts, nut butters, and seeds. Unsalted canned beans. Lean cuts of beef with fat trimmed off. Low-sodium, lean deli meat. Dairy Low-fat (1%) or fat-free (skim) milk. Fat-free, low-fat, or reduced-fat cheeses. Nonfat, low-sodium ricotta or cottage cheese. Low-fat or nonfat yogurt. Low-fat, low-sodium cheese. Fats and oils Soft margarine without trans fats. Vegetable oil. Low-fat, reduced-fat, or light mayonnaise and salad dressings (reduced-sodium). Canola, safflower, olive, soybean, and sunflower oils. Avocado. Seasoning and other foods Herbs. Spices. Seasoning mixes without salt. Unsalted popcorn and pretzels. Fat-free sweets. What foods are not recommended? The items listed may not be a complete list. Talk with your dietitian about what dietary choices are best for you. Grains Baked goods made with fat, such as croissants, muffins, or some breads. Dry pasta or rice meal packs. Vegetables Creamed or fried vegetables. Vegetables in a cheese sauce. Regular canned vegetables (not low-sodium or reduced-sodium). Regular canned tomato sauce and paste (not low-sodium or reduced-sodium). Regular tomato and vegetable juice (not low-sodium or reduced-sodium). Angie Fava. Olives. Fruits Canned fruit in a light or heavy syrup. Fried fruit. Fruit in cream or butter sauce. Meat and other protein foods Fatty cuts of meat. Ribs. Fried meat. Berniece Salines. Sausage.  Bologna and other processed lunch meats. Salami. Fatback. Hotdogs. Bratwurst. Salted nuts and seeds. Canned beans with added salt. Canned or smoked fish. Whole eggs or egg yolks. Chicken or [place] with skin. Dairy Whole or 2% milk, cream, and half-and-half. Whole or full-fat cream cheese. Whole-fat or sweetened yogurt. Full-fat cheese. Nondairy creamers. Whipped toppings. Processed cheese and cheese spreads. Fats and oils Butter. Stick margarine. Lard. Shortening. Ghee. Bacon fat. Tropical oils, such as coconut, palm kernel, or palm oil. Seasoning and other foods Salted popcorn and pretzels. Onion salt, garlic salt, seasoned salt, table salt, and sea salt. Worcestershire sauce. Tartar sauce. Barbecue sauce. Teriyaki sauce. Soy sauce, including reduced-sodium. Steak sauce. Canned and packaged gravies. Fish sauce. Oyster sauce. Cocktail sauce. Horseradish that you find on the shelf. Ketchup. Mustard. Meat flavorings and tenderizers. Bouillon cubes. Hot sauce and Tabasco sauce. Premade or packaged marinades. Premade or packaged taco seasonings. Relishes. Regular salad dressings. Where to find more information:  National Heart, Lung, and [place]: https://wilson-eaton.com/  American Heart Association: www.heart.org Summary  The DASH eating plan is a healthy eating plan that has been shown to reduce high blood pressure (hypertension). It may also reduce your risk for type 2 diabetes, heart disease, and stroke.  With the  DASH eating plan, you should limit salt (sodium) intake to 2,300 mg a day. If you have hypertension, you may need to reduce your sodium intake to 1,500 mg a day.  When on the DASH eating plan, aim to eat more fresh fruits and vegetables, whole grains, lean proteins, low-fat dairy, and heart-healthy fats.  Work with your health care provider or diet and nutrition specialist (dietitian) to adjust your eating plan to your individual calorie needs. This information is not intended to  replace advice given to you by your health care provider. Make sure you discuss any questions you have with your health care provider. Document Released: 10/15/2011 Document Revised: 10/08/2017 Document Reviewed: 10/19/2016 Elsevier Patient Education  [employer].  Low-Sodium Eating Plan Sodium, which is an element that makes up salt, helps you maintain a healthy balance of fluids in your body. Too much sodium can increase your blood pressure and cause fluid and waste to be held in your body. Your health care provider or dietitian may recommend following this plan if you have high blood pressure (hypertension), kidney disease, liver disease, or heart failure. Eating less sodium can help lower your blood pressure, reduce swelling, and protect your heart, liver, and kidneys. What are tips for following this plan? General guidelines  Most people on this plan should limit their sodium intake to 1,500-2,000 mg (milligrams) of sodium each day. Reading food labels   The Nutrition Facts label lists the amount of sodium in one serving of the food. If you eat more than one serving, you must multiply the listed amount of sodium by the number of servings.  Choose foods with less than 140 mg of sodium per serving.  Avoid foods with 300 mg of sodium or more per serving. Shopping  Look for lower-sodium products, often labeled as "low-sodium" or "no salt added."  Always check the sodium content even if foods are labeled as "unsalted" or "no salt added".  Buy fresh foods. ? Avoid canned foods and premade or frozen meals. ? Avoid canned, cured, or processed meats  Buy breads that have less than 80 mg of sodium per slice. Cooking  Eat more home-cooked food and less restaurant, buffet, and fast food.  Avoid adding salt when cooking. Use salt-free seasonings or herbs instead of table salt or sea salt. Check with your health care provider or pharmacist before using salt substitutes.  Cook with  plant-based oils, such as canola, sunflower, or olive oil. Meal planning  When eating at a restaurant, ask that your food be prepared with less salt or no salt, if possible.  Avoid foods that contain MSG (monosodium glutamate). MSG is sometimes added to [place] food, bouillon, and some canned foods. What foods are recommended? The items listed may not be a complete list. Talk with your dietitian about what dietary choices are best for you. Grains Low-sodium cereals, including oats, puffed wheat and rice, and shredded wheat. Low-sodium crackers. Unsalted rice. Unsalted pasta. Low-sodium bread. Whole-grain breads and whole-grain pasta. Vegetables Fresh or frozen vegetables. "No salt added" canned vegetables. "No salt added" tomato sauce and paste. Low-sodium or reduced-sodium tomato and vegetable juice. Fruits Fresh, frozen, or canned fruit. Fruit juice. Meats and other protein foods Fresh or frozen (no salt added) meat, poultry, seafood, and fish. Low-sodium canned tuna and salmon. Unsalted nuts. Dried peas, beans, and lentils without added salt. Unsalted canned beans. Eggs. Unsalted nut butters. Dairy Milk. Soy milk. Cheese that is naturally low in sodium, such as ricotta cheese, fresh  mozzarella, or Swiss cheese Low-sodium or reduced-sodium cheese. Cream cheese. Yogurt. Fats and oils Unsalted butter. Unsalted margarine with no trans fat. Vegetable oils such as canola or olive oils. Seasonings and other foods Fresh and dried herbs and spices. Salt-free seasonings. Low-sodium mustard and ketchup. Sodium-free salad dressing. Sodium-free light mayonnaise. Fresh or refrigerated horseradish. Lemon juice. Vinegar. Homemade, reduced-sodium, or low-sodium soups. Unsalted popcorn and pretzels. Low-salt or salt-free chips. What foods are not recommended? The items listed may not be a complete list. Talk with your dietitian about what dietary choices are best for you. Grains Instant hot cereals. Bread  stuffing, pancake, and biscuit mixes. Croutons. Seasoned rice or pasta mixes. Noodle soup cups. Boxed or frozen macaroni and cheese. Regular salted crackers. Self-rising flour. Vegetables Sauerkraut, pickled vegetables, and relishes. Olives. [place] fries. Onion rings. Regular canned vegetables (not low-sodium or reduced-sodium). Regular canned tomato sauce and paste (not low-sodium or reduced-sodium). Regular tomato and vegetable juice (not low-sodium or reduced-sodium). Frozen vegetables in sauces. Meats and other protein foods Meat or fish that is salted, canned, smoked, spiced, or pickled. Bacon, ham, sausage, hotdogs, corned beef, chipped beef, packaged lunch meats, salt pork, jerky, pickled herring, anchovies, regular canned tuna, sardines, salted nuts. Dairy Processed cheese and cheese spreads. Cheese curds. Blue cheese. Feta cheese. String cheese. Regular cottage cheese. Buttermilk. Canned milk. Fats and oils Salted butter. Regular margarine. Ghee. Bacon fat. Seasonings and other foods Onion salt, garlic salt, seasoned salt, table salt, and sea salt. Canned and packaged gravies. Worcestershire sauce. Tartar sauce. Barbecue sauce. Teriyaki sauce. Soy sauce, including reduced-sodium. Steak sauce. Fish sauce. Oyster sauce. Cocktail sauce. Horseradish that you find on the shelf. Regular ketchup and mustard. Meat flavorings and tenderizers. Bouillon cubes. Hot sauce and Tabasco sauce. Premade or packaged marinades. Premade or packaged taco seasonings. Relishes. Regular salad dressings. Salsa. Potato and tortilla chips. Corn chips and puffs. Salted popcorn and pretzels. Canned or dried soups. Pizza. Frozen entrees and pot pies. Summary  Eating less sodium can help lower your blood pressure, reduce swelling, and protect your heart, liver, and kidneys.  Most people on this plan should limit their sodium intake to 1,500-2,000 mg (milligrams) of sodium each day.  Canned, boxed, and frozen foods are  high in sodium. Restaurant foods, fast foods, and pizza are also very high in sodium. You also get sodium by adding salt to food.  Try to cook at home, eat more fresh fruits and vegetables, and eat less fast food, canned, processed, or prepared foods. This information is not intended to replace advice given to you by your health care provider. Make sure you discuss any questions you have with your health care provider. Document Released: 04/17/2002 Document Revised: 10/08/2017 Document Reviewed: 10/19/2016 Elsevier Patient Education  2020 [employer].

## 2019-11-02 NOTE — Progress Notes (Signed)
Telephone Note  I connected with Peter Castro  on 11/02/19 at 10:45 AM EST by a video enabled telemedicine application and verified that I am speaking with the correct person using two identifiers.  Location patient: car Location provider:work or home office Persons participating in the virtual visit: patient, provider  I discussed the limitations of evaluation and management by telemedicine and the availability of in person appointments. The patient expressed understanding and agreed to proceed.   HPI: 1. HTN/Afib BP at home 120-130s/80s-90s per pt on coreg 25 mg bid, lis 10 mg qd, norvasc 5 mg qd and flecainide and eliquis doing well  2. No concerns today feeling well    ROS: See pertinent positives and negatives per HPI. CV: no chest pain, palpiations  Lungs: no sob  GI: no abdominal pain GU:no issues h/o elevated PSA in the past  Neuro: +short term memory loss   Past Medical History:  Diagnosis Date  . A-fib (HCC)   . Arthritis   . GERD (gastroesophageal reflux disease)   . History of chicken pox   . Hyperlipidemia   . Hypertension   . Loss of memory   . Stroke (HCC)    ~2013/14    Past Surgical History:  Procedure Laterality Date  . APPENDECTOMY    . OTHER SURGICAL HISTORY     cryoballon pvi isthmus tricuspid ablation Dr. Hurwitz 01/2014 [place]     Family History  Problem Relation Age of Onset  . Dementia Mother   . Cancer Father        lung smoker     SOCIAL HX:  From [place]  Retired  Some college  Moved from [place] to be with son and 2 grandkids  Widowed since ~2013/14  Owns guns  Wears seat belt  Safe in relationship    Current Outpatient Medications:  .  amLODipine (NORVASC) 5 MG tablet, Take 1 tablet (5 mg total) by mouth daily as needed., Disp: 90 tablet, Rfl: 3 .  apixaban (ELIQUIS) 5 MG TABS tablet, Take 1 tablet (5 mg total) by mouth 2 (two) times daily., Disp: 180 tablet, Rfl: 3 .  atorvastatin (LIPITOR) 10 MG tablet, Take 1  tablet (10 mg total) by mouth daily at 6 PM., Disp: 90 tablet, Rfl: 3 .  carvedilol (COREG) 25 MG tablet, Take 1 tablet (25 mg total) by mouth 2 (two) times daily with a meal., Disp: 180 tablet, Rfl: 1 .  flecainide (TAMBOCOR) 100 MG tablet, Take 1 tablet (100 mg total) by mouth 2 (two) times daily., Disp: 180 tablet, Rfl: 3 .  lisinopril (ZESTRIL) 10 MG tablet, Take 1 tablet (10 mg total) by mouth daily., Disp: 90 tablet, Rfl: 3  EXAM:  VITALS per patient if applicable:  GENERAL: alert, oriented, appears well and in no acute distress  HEENT: atraumatic, conjunttiva clear, no obvious abnormalities on inspection of external nose and ears  NECK: normal movements of the head and neck  LUNGS: on inspection no signs of respiratory distress, breathing rate appears normal, no obvious gross SOB, gasping or wheezing  CV: no obvious cyanosis  MS: moves all visible extremities without noticeable abnormality  PSYCH/NEURO: pleasant and cooperative, no obvious depression or anxiety, speech and thought processing grossly intact  ASSESSMENT AND PLAN:  Discussed the following assessment and plan: Essential hypertension/HLD - Plan: atorvastatin (LIPITOR) 10 MG tablet, lisinopril (ZESTRIL) 10 MG tablet, Comprehensive metabolic panel, CBC with Differential/Platelet, Lipid panel, Urinalysis, Routine w reflex microscopic Monitor BP fasting  Labs 12/18/19    Cerebrovascular accident (CVA), unspecified mechanism ([place]) - Plan: atorvastatin (LIPITOR) 10 MG tablet  Atrial fibrillation, unspecified type ([place]) - Plan: TSH Cont meds   Elevated PSA - Plan: PSA, total and free  HM Flu shot had utd Need to check pcp records:  Tdap no record Prevnar no record pna 2308/11/22 shingrix no record mmr and hep b immune  Former smoker quit 17 years from 11/2018 smoked x 20 years max 1 ppd  Check PSAelevated with h/o elevation PSA was 5.6 08/2016, PSA 3.480 02/09/18  H/o prediabetes A1C 5.33 02/09/18   Saw urology  01/18/2019 will repeat PSA to see where trending with next labs 12/18/19   Colonoscopy never had pt declines disc cologuard to consider in the future    Fasting labs 12/18/19   -we discussed possible serious and likely etiologies, options for evaluation and workup, limitations of telemedicine visit vs in person visit, treatment, treatment risks and precautions. Pt prefers to treat via telemedicine empirically rather then risking or undertaking an in person visit at this moment. Patient agrees to seek prompt in person care if worsening, new symptoms arise, or if is not improving with treatment.   I discussed the assessment and treatment plan with the patient. The patient was provided an opportunity to ask questions and all were answered. The patient agreed with the plan and demonstrated an understanding of the instructions.   The patient was advised to call back or seek an in-person evaluation if the symptoms worsen or if the condition fails to improve as anticipated.  Time spent 20 minutes  Delorise Jackson, MD

## 2019-12-18 ENCOUNTER — Other Ambulatory Visit: Payer: Self-pay

## 2019-12-18 ENCOUNTER — Other Ambulatory Visit (INDEPENDENT_AMBULATORY_CARE_PROVIDER_SITE_OTHER): Payer: Medicare HMO

## 2019-12-18 ENCOUNTER — Telehealth: Payer: Self-pay | Admitting: Internal Medicine

## 2019-12-18 DIAGNOSIS — R972 Elevated prostate specific antigen [PSA]: Secondary | ICD-10-CM | POA: Diagnosis not present

## 2019-12-18 DIAGNOSIS — Z1389 Encounter for screening for other disorder: Secondary | ICD-10-CM

## 2019-12-18 DIAGNOSIS — I10 Essential (primary) hypertension: Secondary | ICD-10-CM | POA: Diagnosis not present

## 2019-12-18 DIAGNOSIS — Z1329 Encounter for screening for other suspected endocrine disorder: Secondary | ICD-10-CM | POA: Diagnosis not present

## 2019-12-18 DIAGNOSIS — I4891 Unspecified atrial fibrillation: Secondary | ICD-10-CM

## 2019-12-18 LAB — CBC WITH DIFFERENTIAL/PLATELET
Basophils Absolute: 0.1 10*3/uL (ref 0.0–0.1)
Basophils Relative: 0.8 % (ref 0.0–3.0)
Eosinophils Absolute: 0.1 10*3/uL (ref 0.0–0.7)
Eosinophils Relative: 1.5 % (ref 0.0–5.0)
HCT: 48.1 % (ref 39.0–52.0)
Hemoglobin: 15.9 g/dL (ref 13.0–17.0)
Lymphocytes Relative: 25.1 % (ref 12.0–46.0)
Lymphs Abs: 1.9 10*3/uL (ref 0.7–4.0)
MCHC: 33.1 g/dL (ref 30.0–36.0)
MCV: 92.1 fl (ref 78.0–100.0)
Monocytes Absolute: 0.7 10*3/uL (ref 0.1–1.0)
Monocytes Relative: 8.7 % (ref 3.0–12.0)
Neutro Abs: 4.9 10*3/uL (ref 1.4–7.7)
Neutrophils Relative %: 63.9 % (ref 43.0–77.0)
Platelets: 162 10*3/uL (ref 150.0–400.0)
RBC: 5.23 Mil/uL (ref 4.22–5.81)
RDW: 14.4 % (ref 11.5–15.5)
WBC: 7.7 10*3/uL (ref 4.0–10.5)

## 2019-12-18 LAB — COMPREHENSIVE METABOLIC PANEL
ALT: 20 U/L (ref 0–53)
AST: 19 U/L (ref 0–37)
Albumin: 4 g/dL (ref 3.5–5.2)
Alkaline Phosphatase: 79 U/L (ref 39–117)
BUN: 19 mg/dL (ref 6–23)
CO2: 32 mEq/L (ref 19–32)
Calcium: 9 mg/dL (ref 8.4–10.5)
Chloride: 103 mEq/L (ref 96–112)
Creatinine, Ser: 1.35 mg/dL (ref 0.40–1.50)
GFR: 51.77 mL/min — ABNORMAL LOW (ref >60.00)
Glucose, Bld: 93 mg/dL (ref 70–99)
Potassium: 4.1 mEq/L (ref 3.5–5.1)
Sodium: 141 mEq/L (ref 135–145)
Total Bilirubin: 0.7 mg/dL (ref 0.2–1.2)
Total Protein: 6.5 g/dL (ref 6.0–8.3)

## 2019-12-18 LAB — LIPID PANEL
Cholesterol: 136 mg/dL (ref 0–200)
HDL: 51.3 mg/dL (ref >39.00)
LDL Cholesterol: 72 mg/dL (ref 0–99)
NonHDL: 84.71
Total CHOL/HDL Ratio: 3
Triglycerides: 62 mg/dL (ref 0.0–149.0)
VLDL: 12.4 mg/dL (ref 0.0–40.0)

## 2019-12-18 LAB — TSH: TSH: 1.81 u[IU]/mL (ref 0.35–4.50)

## 2019-12-18 NOTE — Telephone Encounter (Signed)
Pt  Would like to have something called in for an on going cough. Pt Didn't want to to make an appt because Dr. French Ana already knows what's going on

## 2019-12-18 NOTE — Telephone Encounter (Signed)
Lisinopril can cause cough  Does he want to change to a cousin of this losartan which is less likely do to this?  Also does he want me to order CXR?  Any GERD? Any asthma?  H/o smoking?    TMS

## 2019-12-19 LAB — URINALYSIS, ROUTINE W REFLEX MICROSCOPIC
Bilirubin Urine: NEGATIVE
Glucose, UA: NEGATIVE
Hgb urine dipstick: NEGATIVE
Ketones, ur: NEGATIVE
Leukocytes,Ua: NEGATIVE
Nitrite: NEGATIVE
Protein, ur: NEGATIVE
Specific Gravity, Urine: 1.018 (ref 1.001–1.03)
pH: 7.5 (ref 5.0–8.0)

## 2019-12-19 LAB — PSA, TOTAL AND FREE
PSA, % Free: 26 % (calc) (ref >25)
PSA, Free: 1.1 ng/mL
PSA, Total: 4.3 ng/mL — ABNORMAL HIGH (ref <=4.0)

## 2019-12-20 NOTE — Telephone Encounter (Signed)
No answer, no voicemail.

## 2019-12-22 ENCOUNTER — Ambulatory Visit: Payer: Medicare HMO | Attending: Internal Medicine

## 2019-12-22 DIAGNOSIS — Z23 Encounter for immunization: Secondary | ICD-10-CM | POA: Insufficient documentation

## 2019-12-22 NOTE — Progress Notes (Signed)
   Covid-19 Vaccination Clinic  Name:  Peter Castro    MRN: [id number] DOB: [birth year]  12/22/2019  Mr. Peter Castro was observed post Covid-19 immunization for 15 minutes without incidence. He was provided with Vaccine Information Sheet and instruction to access the V-Safe system.   Mr. Peter Castro was instructed to call 911 with any severe reactions post vaccine: Marland Kitchen Difficulty breathing  . Swelling of your face and throat  . A fast heartbeat  . A bad rash all over your body  . Dizziness and weakness    Immunizations Administered    Name Date Dose VIS Date Route   Pfizer COVID-19 Vaccine 12/22/2019 11:19 AM 0.3 mL 10/20/2019 Intramuscular   Manufacturer: [employer], [employer]   Lot: [id number]   NDC: [address]-2

## 2020-01-15 NOTE — Progress Notes (Signed)
Cardiology Office Note  Date:  01/16/2020   ID:  Peter Castro, DOB: 01/30/1946, MRN: [id number]  PCP:  McLean-Scocuzza, Nino Glow, MD   Chief Complaint  Patient presents with  . office visit    12 month F/U; Meds verbally reviewed with patient.    HPI:  Peter Castro is a 74 y.o. male with a history of: Atrial fibrillation ([place]), [place] in [place] Essential hypertension Hyperlipidemia Cerebrovascular accident (CVA) ([place]) Memory loss Former smoker, quit 20 years ago  Who presents for his atrial fibrillation.  Weight up 7 pounds Active, walking Denies any chest pain or shortness of breath on exertion  Planning on having the vaccine Going to [place] for 2 months, has family there  denies significant tachycardia, or paroxysmal tachycardia  His previous providers are in [place], [place], Dr. Leta Jungling, MD and Dr. Delorise Shiner Day, MD. He moved to [facility] from [place], [place] a couple of months ago for his son. He used to have atrial fibrillation, but after the ablasion he has not gone back into it. Compliant with his with Eliquis, metoprolol, and flecainide.   He used to be a Engineer, structural in [place], [place].   Former smoker, quit about 20 years ago.     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  EKG personally reviewed by myself on todays visit Shows NSr rate 66 bpm   Other past medical history reviewed Previously seen in [place] last seen 10/20/17 had cryo ballon pulm vein isolation and antral substrate modifications with cavo tricuspid isthmus flutter ablation 01/09/14 prior to this failed multiple cardioversions and failed antiarrhythmic meds last DCCV 12/12/13 then put on flecainide   Echo 08/18/13 LVEF 40-45 % mild sclerotic aortic valve, mild MR  PMH:   has a past medical history of A-fib ([place]), Arthritis, GERD (gastroesophageal reflux disease), History of chicken pox, Hyperlipidemia, Hypertension, Loss of memory, and Stroke ([place]).  PSH:    Past Surgical  History:  Procedure Laterality Date  . APPENDECTOMY    . OTHER SURGICAL HISTORY     cryoballon pvi isthmus tricuspid ablation Dr. Bearl Mulberry 01/2014 [place]     Current Outpatient Medications  Medication Sig Dispense Refill  . amLODipine (NORVASC) 5 MG tablet Take 1 tablet (5 mg total) by mouth daily as needed. 90 tablet 3  . apixaban (ELIQUIS) 5 MG TABS tablet Take 1 tablet (5 mg total) by mouth 2 (two) times daily. 180 tablet 3  . atorvastatin (LIPITOR) 10 MG tablet Take 1 tablet (10 mg total) by mouth daily at 6 PM. 90 tablet 3  . carvedilol (COREG) 25 MG tablet Take 1 tablet (25 mg total) by mouth 2 (two) times daily with a meal. 180 tablet 1  . flecainide (TAMBOCOR) 100 MG tablet Take 1 tablet (100 mg total) by mouth 2 (two) times daily. 180 tablet 3  . lisinopril (ZESTRIL) 10 MG tablet Take 1 tablet (10 mg total) by mouth daily. 90 tablet 3   No current facility-administered medications for this visit.     ALLERGIES:   Patient has no known allergies.   SOCIAL HISTORY:  The patient  reports that he has quit smoking. He has never used smokeless tobacco. He reports current alcohol use. He reports previous drug use.   FAMILY HISTORY:   family history includes Cancer in his father; Dementia in his mother.    REVIEW OF SYSTEMS: Review of Systems  Constitutional: Negative.   Eyes: Negative.   Respiratory: Negative.  Cardiovascular: Negative.  Negative for leg swelling.  Gastrointestinal: Negative.   Genitourinary: Negative.   Musculoskeletal: Negative.   Neurological: Negative.   Psychiatric/Behavioral: Negative.   All other systems reviewed and are negative.   PHYSICAL EXAM: VS:  BP 130/80 (BP Location: Left Arm, Patient Position: Sitting, Cuff Size: Normal)   Pulse 66   Temp (!) 97.1 F (36.2 C)   Ht 5\' 6"  (1.676 m)   Wt 202 lb (91.6 kg)   SpO2 95%   BMI 32.60 kg/m  , BMI Body mass index is 32.6 kg/m.  Constitutional:  oriented to person, place, and time. No  distress.  HENT:  Head: Grossly normal Eyes:  no discharge. No scleral icterus.  Neck: No JVD, no carotid bruits  Cardiovascular: Regular rate and rhythm, no murmurs appreciated Pulmonary/Chest: Clear to auscultation bilaterally, no wheezes or rails Abdominal: Soft.  no distension.  no tenderness.  Musculoskeletal: Normal range of motion Neurological:  normal muscle tone. Coordination normal. No atrophy Skin: Skin warm and dry Psychiatric: normal affect, pleasant  RECENT LABS: 12/18/2019: ALT 20; BUN 19; Creatinine, Ser 1.35; Hemoglobin 15.9; Platelets 162.0; Potassium 4.1; Sodium 141; TSH 1.81    LIPID PANEL: Lab Results  Component Value Date   CHOL 136 12/18/2019   HDL 51.30 12/18/2019   LDLCALC 72 12/18/2019   TRIG 62.0 12/18/2019      WEIGHT: Wt Readings from Last 3 Encounters:  01/16/20 202 lb (91.6 kg)  11/02/19 190 lb (86.2 kg)  01/25/19 196 lb (88.9 kg)      ASSESSMENT AND PLAN:  Paroxysmal atrial fibrillation (HCC) - Plan: EKG 12-Lead Maintaining normal sinus rhythm Recommend he continue anticoagulation We will continue flecainide and carvedilol  Essential hypertension Blood pressure is well controlled on today's visit. No changes made to the medications.  Cerebrovascular accident (CVA), unspecified mechanism (HCC) On anticoagulation History of atrial fibrillation  No recurrent TIA or stroke symptoms  Mixed hyperlipidemia Cholesterol at goal, continue Lipitor  Disposition:   F/U  12 months  Total encounter time more than 25 minutes. Greater than 50% was spent in counseling and coordination of care with the patient.    Orders Placed This Encounter  Procedures  . EKG 12-Lead    Signed, 01/27/19, M.D., Ph.D. 01/16/2020  [facility] [place], [place]

## 2020-01-16 ENCOUNTER — Encounter: Payer: Self-pay | Admitting: Cardiovascular Disease

## 2020-01-16 ENCOUNTER — Other Ambulatory Visit: Payer: Self-pay

## 2020-01-16 ENCOUNTER — Ambulatory Visit (INDEPENDENT_AMBULATORY_CARE_PROVIDER_SITE_OTHER): Payer: Medicare HMO | Admitting: Cardiovascular Disease

## 2020-01-16 VITALS — BP 130/80 | HR 66 | Temp 97.1°F | Ht 66.0 in | Wt 202.0 lb

## 2020-01-16 DIAGNOSIS — E782 Mixed hyperlipidemia: Secondary | ICD-10-CM

## 2020-01-16 DIAGNOSIS — I480 Paroxysmal atrial fibrillation: Secondary | ICD-10-CM

## 2020-01-16 DIAGNOSIS — I10 Essential (primary) hypertension: Secondary | ICD-10-CM | POA: Diagnosis not present

## 2020-01-16 DIAGNOSIS — I639 Cerebral infarction, unspecified: Secondary | ICD-10-CM

## 2020-01-16 NOTE — Patient Instructions (Signed)

## 2020-01-17 ENCOUNTER — Ambulatory Visit: Payer: Medicare HMO | Attending: Internal Medicine

## 2020-01-17 DIAGNOSIS — Z23 Encounter for immunization: Secondary | ICD-10-CM

## 2020-01-17 NOTE — Progress Notes (Signed)
   Covid-19 Vaccination Clinic  Name:  Peter Castro    MRN: [id number] DOB: 1946/11/08  01/17/2020  Mr. [name] was observed post Covid-19 immunization for 15 minutes without incident. He was provided with Vaccine Information Sheet and instruction to access the V-Safe system.   [name] was instructed to call 911 with any severe reactions post vaccine: Marland Kitchen Difficulty breathing  . Swelling of face and throat  . A fast heartbeat  . A bad rash all over body  . Dizziness and weakness   Immunizations Administered    Name Date Dose VIS Date Route   Pfizer COVID-19 Vaccine 01/17/2020  9:26 AM 0.3 mL 10/20/2019 Intramuscular   Manufacturer: [employer], [employer]   Lot: [id number]   NDC: [address]-0

## 2020-02-07 ENCOUNTER — Other Ambulatory Visit: Payer: Self-pay | Admitting: Internal Medicine

## 2020-02-07 DIAGNOSIS — I4891 Unspecified atrial fibrillation: Secondary | ICD-10-CM

## 2020-02-07 DIAGNOSIS — I10 Essential (primary) hypertension: Secondary | ICD-10-CM

## 2020-02-07 MED ORDER — CARVEDILOL 25 MG PO TABS: 25.0000 mg | ORAL_TABLET | Freq: Two times a day (BID) | ORAL | 3 refills | Status: DC

## 2020-02-07 MED ORDER — AMLODIPINE BESYLATE 5 MG PO TABS: 5.0000 mg | ORAL_TABLET | Freq: Every day | ORAL | 3 refills | Status: DC | PRN

## 2020-03-05 ENCOUNTER — Other Ambulatory Visit: Payer: Self-pay | Admitting: Cardiovascular Disease

## 2020-03-05 DIAGNOSIS — I4891 Unspecified atrial fibrillation: Secondary | ICD-10-CM

## 2020-05-07 ENCOUNTER — Ambulatory Visit: Payer: Medicare HMO | Admitting: Internal Medicine

## 2020-05-15 ENCOUNTER — Other Ambulatory Visit: Payer: Self-pay | Admitting: Cardiovascular Disease

## 2020-05-15 DIAGNOSIS — I4891 Unspecified atrial fibrillation: Secondary | ICD-10-CM

## 2020-05-15 NOTE — Telephone Encounter (Signed)
Pt's age 74, wt 91.6 kg, SCr 1.35, CrCl 63.14, last ov w/ TG 01/16/20.

## 2020-05-15 NOTE — Telephone Encounter (Signed)
Refill request

## 2020-06-03 ENCOUNTER — Telehealth: Payer: Self-pay | Admitting: Internal Medicine

## 2020-06-03 NOTE — Telephone Encounter (Signed)
MAILBOX FULL. Unable to LM. Pt due to schedule Medicare Annual Wellness Visit (AWV) either virtually or audio only.  No hx of AWV; please schedule at anytime with Denisa O'Brien-Blaney at [facility]

## 2020-09-13 ENCOUNTER — Ambulatory Visit (INDEPENDENT_AMBULATORY_CARE_PROVIDER_SITE_OTHER): Payer: Medicare HMO

## 2020-09-13 ENCOUNTER — Other Ambulatory Visit: Payer: Self-pay

## 2020-09-13 VITALS — Temp 98.0°F

## 2020-09-13 DIAGNOSIS — Z23 Encounter for immunization: Secondary | ICD-10-CM | POA: Diagnosis not present

## 2020-10-11 ENCOUNTER — Other Ambulatory Visit (INDEPENDENT_AMBULATORY_CARE_PROVIDER_SITE_OTHER): Payer: Medicare HMO

## 2020-10-11 ENCOUNTER — Ambulatory Visit (INDEPENDENT_AMBULATORY_CARE_PROVIDER_SITE_OTHER): Payer: Medicare HMO | Admitting: Internal Medicine

## 2020-10-11 ENCOUNTER — Encounter: Payer: Self-pay | Admitting: Internal Medicine

## 2020-10-11 ENCOUNTER — Other Ambulatory Visit: Payer: Self-pay

## 2020-10-11 VITALS — BP 108/70 | HR 66 | Temp 98.0°F | Ht 66.0 in | Wt 196.2 lb

## 2020-10-11 DIAGNOSIS — I10 Essential (primary) hypertension: Secondary | ICD-10-CM | POA: Diagnosis not present

## 2020-10-11 DIAGNOSIS — I639 Cerebral infarction, unspecified: Secondary | ICD-10-CM | POA: Diagnosis not present

## 2020-10-11 DIAGNOSIS — I4891 Unspecified atrial fibrillation: Secondary | ICD-10-CM

## 2020-10-11 DIAGNOSIS — E669 Obesity, unspecified: Secondary | ICD-10-CM

## 2020-10-11 DIAGNOSIS — R972 Elevated prostate specific antigen [PSA]: Secondary | ICD-10-CM | POA: Diagnosis not present

## 2020-10-11 DIAGNOSIS — E66811 Obesity, class 1: Secondary | ICD-10-CM

## 2020-10-11 LAB — CBC WITH DIFFERENTIAL/PLATELET
Basophils Absolute: 0.1 10*3/uL (ref 0.0–0.1)
Basophils Relative: 0.7 % (ref 0.0–3.0)
Eosinophils Absolute: 0.1 10*3/uL (ref 0.0–0.7)
Eosinophils Relative: 1 % (ref 0.0–5.0)
HCT: 50.4 % (ref 39.0–52.0)
Hemoglobin: 16.6 g/dL (ref 13.0–17.0)
Lymphocytes Relative: 26.7 % (ref 12.0–46.0)
Lymphs Abs: 2.3 10*3/uL (ref 0.7–4.0)
MCHC: 32.9 g/dL (ref 30.0–36.0)
MCV: 90.7 fl (ref 78.0–100.0)
Monocytes Absolute: 0.8 10*3/uL (ref 0.1–1.0)
Monocytes Relative: 9.4 % (ref 3.0–12.0)
Neutro Abs: 5.3 10*3/uL (ref 1.4–7.7)
Neutrophils Relative %: 62.2 % (ref 43.0–77.0)
Platelets: 189 10*3/uL (ref 150.0–400.0)
RBC: 5.55 Mil/uL (ref 4.22–5.81)
RDW: 14.7 % (ref 11.5–15.5)
WBC: 8.5 10*3/uL (ref 4.0–10.5)

## 2020-10-11 LAB — LIPID PANEL
Cholesterol: 153 mg/dL (ref 0–200)
HDL: 56.1 mg/dL (ref >39.00)
LDL Cholesterol: 84 mg/dL (ref 0–99)
NonHDL: 96.56
Total CHOL/HDL Ratio: 3
Triglycerides: 61 mg/dL (ref 0.0–149.0)
VLDL: 12.2 mg/dL (ref 0.0–40.0)

## 2020-10-11 LAB — COMPREHENSIVE METABOLIC PANEL
ALT: 20 U/L (ref 0–53)
AST: 22 U/L (ref 0–37)
Albumin: 4.5 g/dL (ref 3.5–5.2)
Alkaline Phosphatase: 81 U/L (ref 39–117)
BUN: 19 mg/dL (ref 6–23)
CO2: 32 mEq/L (ref 19–32)
Calcium: 9.7 mg/dL (ref 8.4–10.5)
Chloride: 101 mEq/L (ref 96–112)
Creatinine, Ser: 1.37 mg/dL (ref 0.40–1.50)
GFR: 50.98 mL/min — ABNORMAL LOW (ref >60.00)
Glucose, Bld: 90 mg/dL (ref 70–99)
Potassium: 4.9 mEq/L (ref 3.5–5.1)
Sodium: 140 mEq/L (ref 135–145)
Total Bilirubin: 0.9 mg/dL (ref 0.2–1.2)
Total Protein: 6.8 g/dL (ref 6.0–8.3)

## 2020-10-11 LAB — PSA, MEDICARE: PSA: 5.48 ng/ml — ABNORMAL HIGH (ref 0.10–4.00)

## 2020-10-11 MED ORDER — ATORVASTATIN CALCIUM 10 MG PO TABS: 10.0000 mg | ORAL_TABLET | Freq: Every day | ORAL | 3 refills | Status: DC

## 2020-10-11 MED ORDER — LISINOPRIL 10 MG PO TABS: 10.0000 mg | ORAL_TABLET | Freq: Every day | ORAL | 3 refills | Status: DC

## 2020-10-11 NOTE — Patient Instructions (Addendum)
F/u with Dr. Mariah Milling heart 01/15/2021   [phone] [email or web address] [address]   [address]   [place] [address]   [employer] compression stockings knee highs    Varicose Veins Varicose veins are veins that have become enlarged, bulged, and twisted. They most often appear in the legs. What are the causes? This condition is caused by damage to the valves in the vein. These valves help blood return to your heart. When they are damaged and they stop working properly, blood may flow backward and back up in the veins near the skin, causing the veins to get larger and appear twisted. The condition can result from any issue that causes blood to back up, like pregnancy, prolonged standing, or obesity. What increases the risk? This condition is more likely to develop in people who are:  On their feet a lot.  Pregnant.  Overweight. What are the signs or symptoms? Symptoms of this condition include:  Bulging, twisted, and bluish veins.  A feeling of heaviness. This may be worse at the end of the day.  Leg pain. This may be worse at the end of the day.  Swelling in the leg.  Changes in skin color over the veins. How is this diagnosed? This condition may be diagnosed based on your symptoms, a physical exam, and an ultrasound test. How is this treated? Treatment for this condition may involve:  Avoiding sitting or standing in one position for long periods of time.  Wearing compression stockings. These stockings help to prevent blood clots and reduce swelling in the legs.  Raising (elevating) the legs when resting.  Losing weight.  Exercising regularly. If you have persistent symptoms or want to improve the way your varicose veins look, you may choose to have a procedure to close the varicose veins off or to remove them. Treatments to close off the veins include:  Sclerotherapy. In this treatment, a solution is injected into a vein to  close it off.  Laser treatment. In this treatment, the vein is heated with a laser to close it off.  Radiofrequency vein ablation. In this treatment, an electrical current produced by radio waves is used to close off the vein. Treatments to remove the veins include:  Phlebectomy. In this treatment, the veins are removed through small incisions made over the veins.  Vein ligation and stripping. In this treatment, incisions are made over the veins. The veins are then removed after being tied (ligated) with stitches (sutures). Follow these instructions at home: Activity  Walk as much as possible. Walking increases blood flow. This helps blood return to the heart and takes pressure off your veins. It also increases your cardiovascular strength.  Follow your health care provider's instructions about exercising.  Do not stand or sit in one position for a long period of time.  Do not sit with your legs crossed.  Rest with your legs raised during the day. General instructions   Follow any diet instructions given to you by your health care provider.  Wear compression stockings as directed by your health care provider. Do not wear other kinds of tight clothing around your legs, pelvis, or waist.  Elevate your legs at night to above the level of your heart.  If you get a cut in the skin over the varicose vein and the vein bleeds: ? Lie down with your leg raised. ? Apply firm pressure to the cut with a clean cloth until the bleeding stops. ? Place a bandage (  dressing) on the cut. Contact a health care provider if:  The skin around your varicose veins starts to break down.  You have pain, redness, tenderness, or hard swelling over a vein.  You are uncomfortable because of pain.  You get a cut in the skin over a varicose vein and it will not stop bleeding. Summary  Varicose veins are veins that have become enlarged, bulged, and twisted. They most often appear in the legs.  This  condition is caused by damage to the valves in the vein. These valves help blood return to your heart.  Treatment for this condition includes frequent movements, wearing compression stockings, losing weight, and exercising regularly. In some cases, procedures are done to close off or remove the veins.  Treatment for this condition may include wearing compression stockings, elevating the legs, losing weight, and engaging in regular activity. In some cases, procedures are done to close off or remove the veins. This information is not intended to replace advice given to you by your health care provider. Make sure you discuss any questions you have with your health care provider. Document Revised: 12/22/2018 Document Reviewed: 11/18/2016 Elsevier Patient Education  2020 [employer].

## 2020-10-11 NOTE — Progress Notes (Addendum)
Chief Complaint  Patient presents with  . Follow-up   F/u doing well going to gym daily 1.5 cardio no complaints today  1. htn improved on norvasc 5 mg qd and lis 10 mg qd and coreg 25 mg bid  2. H/o Afib on flecainide 100 mg and eliquis 5 mg bid f/u D.r Rockey Situ 01/2021 He will be going to [place] 06/2021 for 2-3 months where he stillhas a home and family    Review of Systems  Constitutional: Positive for weight loss.       Down 6 lbs trying  HENT: Negative for hearing loss.   Eyes: Negative for blurred vision.  Respiratory: Negative for shortness of breath.   Cardiovascular: Negative for chest pain.  Gastrointestinal: Negative for abdominal pain.  Musculoskeletal: Negative for falls and joint pain.  Skin: Negative for rash.  Neurological: Negative for headaches.  Psychiatric/Behavioral: Negative for depression.   Past Medical History:  Diagnosis Date  . A-fib ([place])   . Arthritis   . GERD (gastroesophageal reflux disease)   . History of chicken pox   . Hyperlipidemia   . Hypertension   . Loss of memory   . Stroke [facility])    ~2013/14   Past Surgical History:  Procedure Laterality Date  . APPENDECTOMY    . OTHER SURGICAL HISTORY     cryoballon pvi isthmus tricuspid ablation Dr. Bearl Mulberry 01/2014 [place]    Family History  Problem Relation Age of Onset  . Dementia Mother   . Cancer Father        lung smoker    Social History   Socioeconomic History  . Marital status: Widowed    Spouse name: Not on file  . Number of children: Not on file  . Years of education: Not on file  . Highest education level: Not on file  Occupational History  . Not on file  Tobacco Use  . Smoking status: Former Research scientist (life sciences)  . Smokeless tobacco: Never Used  Vaping Use  . Vaping Use: Never used  Substance and Sexual Activity  . Alcohol use: Yes    Comment: occassionally-wine  . Drug use: Not Currently  . Sexual activity: Not on file  Other Topics Concern  . Not on file  Social History  Narrative   From [place]    Retired    Some college    Moved from [place] to be with son and 2 grandkids    Widowed since ~2013/14    Owns guns    Wears seat belt    Safe in relationship    Social Determinants of Health   Financial Resource Strain:   . Difficulty of Paying Living Expenses: Not on file  Food Insecurity:   . Worried About Charity fundraiser in the Last Year: Not on file  . Ran Out of Food in the Last Year: Not on file  Transportation Needs:   . Lack of Transportation (Medical): Not on file  . Lack of Transportation (Non-Medical): Not on file  Physical Activity:   . Days of Exercise per Week: Not on file  . Minutes of Exercise per Session: Not on file  Stress:   . Feeling of Stress : Not on file  Social Connections:   . Frequency of Communication with Friends and Family: Not on file  . Frequency of Social Gatherings with Friends and Family: Not on file  . Attends Religious Services: Not on file  . Active Member of Clubs or Organizations: Not on file  .  Attends Archivist Meetings: Not on file  . Marital Status: Not on file  Intimate Partner Violence:   . Fear of Current or Ex-Partner: Not on file  . Emotionally Abused: Not on file  . Physically Abused: Not on file  . Sexually Abused: Not on file   Current Meds  Medication Sig  . amLODipine (NORVASC) 5 MG tablet Take 1 tablet (5 mg total) by mouth daily as needed.  Marland Kitchen atorvastatin (LIPITOR) 10 MG tablet Take 1 tablet (10 mg total) by mouth daily at 6 PM.  . carvedilol (COREG) 25 MG tablet Take 1 tablet (25 mg total) by mouth 2 (two) times daily with a meal.  . ELIQUIS 5 MG TABS tablet TAKE ONE TABLET BY MOUTH TWICE A DAY  . flecainide (TAMBOCOR) 100 MG tablet TAKE ONE TABLET BY MOUTH TWICE A DAY  . lisinopril (ZESTRIL) 10 MG tablet Take 1 tablet (10 mg total) by mouth daily.  . [DISCONTINUED] atorvastatin (LIPITOR) 10 MG tablet Take 1 tablet (10 mg total) by mouth daily at 6 PM.  .  [DISCONTINUED] lisinopril (ZESTRIL) 10 MG tablet Take 1 tablet (10 mg total) by mouth daily.   No Known Allergies No results found for this or any previous visit (from the past 2160 hour(s)). Objective  Body mass index is 31.67 kg/m. Wt Readings from Last 3 Encounters:  10/11/20 196 lb 3.2 oz (89 kg)  01/16/20 202 lb (91.6 kg)  11/02/19 190 lb (86.2 kg)   Temp Readings from Last 3 Encounters:  10/11/20 98 F (36.7 C) (Oral)  09/13/20 98 F (36.7 C)  01/16/20 (!) 97.1 F (36.2 C)   BP Readings from Last 3 Encounters:  10/11/20 108/70  01/16/20 130/80  01/25/19 112/70   Pulse Readings from Last 3 Encounters:  10/11/20 66  01/16/20 66  01/25/19 64    Physical Exam Vitals and nursing note reviewed.  Constitutional:      Appearance: Normal appearance. He is well-developed and well-groomed. He is obese.  HENT:     Head: Normocephalic and atraumatic.  Eyes:     Conjunctiva/sclera: Conjunctivae normal.     Pupils: Pupils are equal, round, and reactive to light.  Cardiovascular:     Rate and Rhythm: Normal rate and regular rhythm.     Heart sounds: Normal heart sounds. No murmur heard.   Pulmonary:     Effort: Pulmonary effort is normal.     Breath sounds: Normal breath sounds.  Abdominal:     Tenderness: There is no abdominal tenderness.  Skin:    General: Skin is warm and moist.  Neurological:     General: No focal deficit present.     Mental Status: He is alert and oriented to person, place, and time. Mental status is at baseline.     Gait: Gait normal.  Psychiatric:        Attention and Perception: Attention and perception normal.        Mood and Affect: Mood and affect normal.        Speech: Speech normal.        Behavior: Behavior normal. Behavior is cooperative.        Thought Content: Thought content normal.        Cognition and Memory: Cognition and memory normal.        Judgment: Judgment normal.     Assessment  Plan  Essential hypertension  controlled - Plan: lisinopril (ZESTRIL) 10 MG tablet, atorvastatin (LIPITOR) 10 MG tablet, Comprehensive metabolic  panel, Lipid panel, CBC with Differential/Platelet, CBC with Differential/Platelet  Atrial fibrillation, unspecified type (HCC) Cont eliquis 5 mg bid  F/u cards 01/2021   Cerebrovascular accident (CVA), unspecified mechanism ([place]) - Plan: atorvastatin (LIPITOR) 10 MG tablet  Obesity (BMI 30.0-34.9)  rec healthy and diet   HM Flu shot had utd  Need to check pcp records:  Tdap no record Prevnar no record pna 2308/11/22 shingrix no record mmr and hep b immune  Former smoker quit 17 years from 11/2018 smoked x 20 years max 1 ppd  Check PSAelevated with h/o elevation PSA was 5.6 08/2016, PSA 3.480 02/09/18  H/o prediabetes A1C 5.33 02/09/18   Saw urology 01/18/2019  -urology did not rec f/u    Ref. Range 12/16/2018 08:57  PSA Latest Ref Range: 0.10 - 4.00 ng/ml 4.50 (H)    Results for RYOMA, NOFZIGER "CHRIS" (MRN [id number]) as of 10/22/2020 08:34  Ref. Range 12/16/2018 08:57 10/11/2020 15:52  PSA Latest Ref Range: 0.10 - 4.00 ng/ml 4.50 (H) 5.48 (H)   Colonoscopy never had pt declines disc cologuard to consider in the future    Labs today  rec healthy diet and exercise   Provider: Dr. Olivia Mackie McLean-Scocuzza-Internal Medicine

## 2020-10-13 ENCOUNTER — Encounter: Payer: Self-pay | Admitting: Emergency Medicine

## 2020-10-13 ENCOUNTER — Emergency Department: Payer: Medicare HMO

## 2020-10-13 ENCOUNTER — Other Ambulatory Visit: Payer: Self-pay

## 2020-10-13 ENCOUNTER — Emergency Department
Admission: EM | Admit: 2020-10-13 | Discharge: 2020-10-13 | Disposition: A | Discharge status: Home / Self Care | Payer: Medicare HMO | Attending: Emergency Medicine | Admitting: Emergency Medicine

## 2020-10-13 DIAGNOSIS — S299XXA Unspecified injury of thorax, initial encounter: Secondary | ICD-10-CM | POA: Diagnosis present

## 2020-10-13 DIAGNOSIS — I639 Cerebral infarction, unspecified: Secondary | ICD-10-CM | POA: Diagnosis not present

## 2020-10-13 DIAGNOSIS — W01198A Fall on same level from slipping, tripping and stumbling with subsequent striking against other object, initial encounter: Secondary | ICD-10-CM | POA: Diagnosis not present

## 2020-10-13 DIAGNOSIS — K449 Diaphragmatic hernia without obstruction or gangrene: Secondary | ICD-10-CM | POA: Diagnosis not present

## 2020-10-13 DIAGNOSIS — R519 Headache, unspecified: Secondary | ICD-10-CM | POA: Diagnosis not present

## 2020-10-13 DIAGNOSIS — Y9301 Activity, walking, marching and hiking: Secondary | ICD-10-CM | POA: Diagnosis not present

## 2020-10-13 DIAGNOSIS — Z87891 Personal history of nicotine dependence: Secondary | ICD-10-CM | POA: Insufficient documentation

## 2020-10-13 DIAGNOSIS — S2232XA Fracture of one rib, left side, initial encounter for closed fracture: Secondary | ICD-10-CM | POA: Diagnosis not present

## 2020-10-13 DIAGNOSIS — I4891 Unspecified atrial fibrillation: Secondary | ICD-10-CM | POA: Insufficient documentation

## 2020-10-13 DIAGNOSIS — I10 Essential (primary) hypertension: Secondary | ICD-10-CM | POA: Insufficient documentation

## 2020-10-13 DIAGNOSIS — G9389 Other specified disorders of brain: Secondary | ICD-10-CM | POA: Diagnosis not present

## 2020-10-13 DIAGNOSIS — Z79899 Other long term (current) drug therapy: Secondary | ICD-10-CM | POA: Diagnosis not present

## 2020-10-13 DIAGNOSIS — Z7901 Long term (current) use of anticoagulants: Secondary | ICD-10-CM | POA: Insufficient documentation

## 2020-10-13 DIAGNOSIS — Y9289 Other specified places as the place of occurrence of the external cause: Secondary | ICD-10-CM | POA: Insufficient documentation

## 2020-10-13 DIAGNOSIS — I2510 Atherosclerotic heart disease of native coronary artery without angina pectoris: Secondary | ICD-10-CM | POA: Diagnosis not present

## 2020-10-13 DIAGNOSIS — J439 Emphysema, unspecified: Secondary | ICD-10-CM | POA: Diagnosis not present

## 2020-10-13 MED ORDER — OXYCODONE-ACETAMINOPHEN 5-325 MG PO TABS
1.0000 | ORAL_TABLET | ORAL | 0 refills | Status: DC | PRN
Start: 2020-10-13 — End: 2020-10-16

## 2020-10-13 MED ORDER — TRAMADOL HCL 50 MG PO TABS
50.0000 mg | ORAL_TABLET | Freq: Once | ORAL | Status: AC
  Administered 2020-10-13: 50 mg via ORAL
  Filled 2020-10-13: qty 1

## 2020-10-13 MED ORDER — OXYCODONE-ACETAMINOPHEN 5-325 MG PO TABS
1.0000 | ORAL_TABLET | Freq: Once | ORAL | Status: DC
  Filled 2020-10-13: qty 1

## 2020-10-13 NOTE — ED Triage Notes (Signed)
Pt with abrasion noted to left ribs area

## 2020-10-13 NOTE — ED Provider Notes (Signed)
[facility] Emergency Department Provider Note  ____________________________________________   First MD Initiated Contact with Patient 10/13/20 1110     (approximate)  I have reviewed the triage vital signs and the nursing notes.   HISTORY  Chief Complaint Fall and Rib Injury    HPI Peter Castro is a 74 y.o. male presents emergency department after a fall last night.  Patient is on Eliquis for A. fib and history of stroke.  States that he is unsure of what happened but now he has a large amount of bruising and a large scrape down the left side of his ribs and is having severe pain.  States pain is worse with movement and a deep breath.  He denies cardiac type chest pain, no fever or chills, no shortness of breath.    Past Medical History:  Diagnosis Date  . A-fib (HCC)   . Arthritis   . GERD (gastroesophageal reflux disease)   . History of chicken pox   . Hyperlipidemia   . Hypertension   . Loss of memory   . Stroke [facility])    ~2013/14    Patient Active Problem List   Diagnosis Date Noted  . Elevated PSA 11/02/2019  . Atrial fibrillation (HCC) 12/08/2018  . Essential hypertension 12/08/2018  . Hyperlipidemia 12/08/2018  . Cerebrovascular accident (CVA) (HCC) 12/08/2018  . Memory loss 12/08/2018    Past Surgical History:  Procedure Laterality Date  . APPENDECTOMY    . OTHER SURGICAL HISTORY     cryoballon pvi isthmus tricuspid ablation Dr. Kathrynn Ducking 01/2014 [place]     Prior to Admission medications   Medication Sig Start Date End Date Taking? Authorizing Provider  amLODipine (NORVASC) 5 MG tablet Take 1 tablet (5 mg total) by mouth daily as needed. 02/07/20   McLean-Scocuzza, Pasty Spillers, MD  atorvastatin (LIPITOR) 10 MG tablet Take 1 tablet (10 mg total) by mouth daily at 6 PM. 10/11/20   McLean-Scocuzza, Pasty Spillers, MD  carvedilol (COREG) 25 MG tablet Take 1 tablet (25 mg total) by mouth 2 (two) times daily with a meal. 02/07/20    McLean-Scocuzza, Pasty Spillers, MD  ELIQUIS 5 MG TABS tablet TAKE ONE TABLET BY MOUTH TWICE A DAY 05/15/20   Antonieta Iba, MD  flecainide (TAMBOCOR) 100 MG tablet TAKE ONE TABLET BY MOUTH TWICE A DAY 03/05/20   Antonieta Iba, MD  lisinopril (ZESTRIL) 10 MG tablet Take 1 tablet (10 mg total) by mouth daily. 10/11/20   McLean-Scocuzza, Pasty Spillers, MD  oxyCODONE-acetaminophen (PERCOCET) 5-325 MG tablet Take 1 tablet by mouth every 4 (four) hours as needed for severe pain. 10/13/20 10/13/21  Faythe Ghee, PA-C    Allergies Patient has no known allergies.  Family History  Problem Relation Age of Onset  . Dementia Mother   . Cancer Father        lung smoker     Social History Social History   Tobacco Use  . Smoking status: Former Games developer  . Smokeless tobacco: Never Used  Vaping Use  . Vaping Use: Never used  Substance Use Topics  . Alcohol use: Yes    Comment: occassionally-wine  . Drug use: Not Currently    Review of Systems  Constitutional: No fever/chills Eyes: No visual changes. ENT: No sore throat. Respiratory: Denies cough, positive for left-sided rib pain Cardiovascular: Denies chest pain Gastrointestinal: Denies abdominal pain Genitourinary: Negative for dysuria. Musculoskeletal: Negative for back pain. Skin: Negative for rash. Psychiatric: no mood changes,  ____________________________________________   PHYSICAL EXAM:  VITAL SIGNS: ED Triage Vitals  Enc Vitals Group     BP 10/13/20 0753 (!) 154/90     Pulse Rate 10/13/20 0753 61     Resp 10/13/20 0753 20     Temp 10/13/20 0753 98 F (36.7 C)     Temp Source 10/13/20 0753 Oral     SpO2 10/13/20 0753 99 %     Weight 10/13/20 0754 187 lb 6.3 oz (85 kg)     Height 10/13/20 0754 5\' 6"  (1.676 m)     Head Circumference --      Peak Flow --      Pain Score 10/13/20 0753 8     Pain Loc --      Pain Edu? --      Excl. in GC? --     Constitutional: Alert and oriented. Well appearing and in no acute  distress. Eyes: Conjunctivae are normal.  Head: Atraumatic. Nose: No congestion/rhinnorhea. Mouth/Throat: Mucous membranes are moist.   Neck:  supple no lymphadenopathy noted Cardiovascular: Normal rate, regular rhythm. Heart sounds are normal Respiratory: Normal respiratory effort.  No retractions, lungs c t a,, left ribs are tender to palpation, large abrasion and bruising noted down the left side of the ribs Abd: soft nontender bs normal all 4 quad GU: deferred Musculoskeletal: FROM all extremities, warm and well perfused Neurologic:  Normal speech and language.  Skin:  Skin is warm, dry and intact. No rash noted. Psychiatric: Mood and affect are normal. Speech and behavior are normal.  ____________________________________________   LABS (all labs ordered are listed, but only abnormal results are displayed)  Labs Reviewed - No data to display ____________________________________________   ____________________________________________  RADIOLOGY  Chest x-ray with left ribs is negative CT of the chest CT of the head  ____________________________________________   PROCEDURES  Procedure(s) performed: No  Procedures    ____________________________________________   INITIAL IMPRESSION / ASSESSMENT AND PLAN / ED COURSE  Pertinent labs & imaging results that were available during my care of the patient were reviewed by me and considered in my medical decision making (see chart for details).   Patient 74 year old male presents after a fall.  See HPI.  Physical exam shows patient to appear stable.  Left ribs are very tender to palpation with large amount of bruising and abrasion noted.  DDx: Left rib contusion, pneumothorax, subdural  X-ray of the left ribs with chest is negative for any acute abnormality  I do have concerns as patient is on Eliquis with a large amount of bruising and noted fall that we will do a CT of the head and CT of the chest to ensure there is  not a small pneumothorax along with rib fracture that was missed on chest and rib x-ray.   ct head is negative Ct chest shows left 4th rib fracture, no pneumothorax  Images and radiology report were reviewed by me  I did explain the findings to the patient.  He was given pain medication and an incentive spirometer.  He was discharged in stable condition.  Peter Castro was evaluated in Emergency Department on 10/13/2020 for the symptoms described in the history of present illness. He was evaluated in the context of the global COVID-19 pandemic, which necessitated consideration that the patient might be at risk for infection with the SARS-CoV-2 virus that causes COVID-19. Institutional protocols and algorithms that pertain to the evaluation of patients at risk for COVID-19 are in a state of  rapid change based on information released by regulatory bodies including the CDC and federal and state organizations. These policies and algorithms were followed during the patient's care in the ED.    As part of my medical decision making, I reviewed the following data within the electronic MEDICAL RECORD NUMBER Nursing notes reviewed and incorporated, Old chart reviewed, Radiograph reviewed , Notes from prior ED visits and [place] Controlled Substance Database  ____________________________________________   FINAL CLINICAL IMPRESSION(S) / ED DIAGNOSES  Final diagnoses:  Closed fracture of one rib of left side, initial encounter      NEW MEDICATIONS STARTED DURING THIS VISIT:  New Prescriptions   OXYCODONE-ACETAMINOPHEN (PERCOCET) 5-325 MG TABLET    Take 1 tablet by mouth every 4 (four) hours as needed for severe pain.     Note:  This document was prepared using Dragon voice recognition software and may include unintentional dictation errors.    Faythe Ghee, PA-C 10/13/20 1353    Gilles Chiquito, MD 10/13/20 [phone]

## 2020-10-13 NOTE — Discharge Instructions (Addendum)
Use the incentive spirometer every 2 hours.  Take pain medication as needed.  You have a fracture on the left fourth rib.  Return if worsening

## 2020-10-13 NOTE — ED Notes (Signed)
Patient now states his son will be here at 1500.

## 2020-10-13 NOTE — ED Triage Notes (Signed)
Pt reports was walking to get in the bed and fell hitting his left ribs on the nightstand. Pt c/o pain to left ribs

## 2020-10-13 NOTE — ED Notes (Signed)
Patient is ambulatory to the hallway bathroom. Patient states his son cannot pick him up until 1700 today. Patient was informed that he will have to stay due to getting Tramadol.

## 2020-10-14 ENCOUNTER — Encounter: Payer: Self-pay | Admitting: Internal Medicine

## 2020-10-14 DIAGNOSIS — S2232XA Fracture of one rib, left side, initial encounter for closed fracture: Secondary | ICD-10-CM

## 2020-10-14 DIAGNOSIS — J439 Emphysema, unspecified: Secondary | ICD-10-CM | POA: Insufficient documentation

## 2020-10-14 DIAGNOSIS — I700 Atherosclerosis of aorta: Secondary | ICD-10-CM | POA: Insufficient documentation

## 2020-10-14 DIAGNOSIS — K449 Diaphragmatic hernia without obstruction or gangrene: Secondary | ICD-10-CM | POA: Insufficient documentation

## 2020-10-14 HISTORY — DX: Fracture of one rib, left side, initial encounter for closed fracture: S22.32XA

## 2020-10-14 NOTE — Addendum Note (Signed)
Addended by: Quentin Ore on: 10/14/2020 02:12 PM   Modules accepted: Orders

## 2020-10-16 ENCOUNTER — Other Ambulatory Visit: Payer: Self-pay | Admitting: Internal Medicine

## 2020-10-16 ENCOUNTER — Telehealth: Payer: Self-pay | Admitting: Internal Medicine

## 2020-10-16 DIAGNOSIS — S2249XD Multiple fractures of ribs, unspecified side, subsequent encounter for fracture with routine healing: Secondary | ICD-10-CM

## 2020-10-16 MED ORDER — OXYCODONE-ACETAMINOPHEN 5-325 MG PO TABS: 1.0000 | ORAL_TABLET | Freq: Three times a day (TID) | ORAL | 0 refills | Status: DC | PRN

## 2020-10-16 NOTE — Telephone Encounter (Signed)
Pt was seen at the ED on 12/5 for a rib fracture  Pt would like a refill on oxyCODONE-acetaminophen (PERCOCET) 5-325 MG tablet for pain

## 2020-10-16 NOTE — Telephone Encounter (Addendum)
Pt states that he is in a lot of pain and only has four pills left. He would like a refill

## 2020-10-22 NOTE — Addendum Note (Signed)
Addended by: Quentin Ore on: 10/22/2020 08:36 AM   Modules accepted: Orders

## 2020-10-24 ENCOUNTER — Telehealth: Payer: Self-pay | Admitting: Internal Medicine

## 2020-10-24 NOTE — Telephone Encounter (Signed)
lft msg with pt son to call ofc to sch MRI

## 2020-11-05 ENCOUNTER — Telehealth: Payer: Self-pay | Admitting: Internal Medicine

## 2020-11-05 NOTE — Telephone Encounter (Signed)
lft vm for pt son to call ofc to sch MRI

## 2020-11-06 NOTE — Telephone Encounter (Signed)
Pt called back returning your call °

## 2020-11-11 ENCOUNTER — Other Ambulatory Visit: Payer: Self-pay | Admitting: Cardiovascular Disease

## 2020-11-11 DIAGNOSIS — I4891 Unspecified atrial fibrillation: Secondary | ICD-10-CM

## 2020-11-11 NOTE — Telephone Encounter (Signed)
Peter Castro advise pt this would look for causes of elevated PSA I.e enlarged prostate and make sure he does not have prostate cancer   Does he still decline prostate MRI?

## 2020-11-11 NOTE — Telephone Encounter (Signed)
Good afternoon!  I spoke with pt he states he does not want to have the MRI prostate done he says he's fine. I did explain you wanted him to have the MRI due to elevated PSA.

## 2020-11-11 NOTE — Telephone Encounter (Signed)
Pt's ate 74, wt 85 kg, SCr 1.37, CrCl 56.87, last ov w/ TG 01/16/20.

## 2020-11-11 NOTE — Telephone Encounter (Signed)
Please review for refill. Thanks!  

## 2020-11-12 NOTE — Telephone Encounter (Signed)
Noted  Dr. Carrisa Keller McLean-Scocuzza  

## 2020-11-12 NOTE — Telephone Encounter (Signed)
Called and informed him of why the imaging was needed. Explained that his levels were high and this imagine will allow [place] to check for enlargement and signs of cancer. Informed him that this would help [place] find out why his levels were high. Patient verbalized understanding and still declined.  He would like to wait until 04/2021 appointment to discuss further. Informed the Patient that if there is something wrong it can worsen between then and now. Patient verbalized understanding and still declines MRI.

## 2020-12-09 ENCOUNTER — Other Ambulatory Visit: Payer: Self-pay

## 2020-12-09 ENCOUNTER — Emergency Department
Admission: EM | Admit: 2020-12-09 | Discharge: 2020-12-09 | Disposition: A | Discharge status: Home / Self Care | Payer: Medicare HMO | Source: Home / Self Care

## 2020-12-09 DIAGNOSIS — R55 Syncope and collapse: Secondary | ICD-10-CM | POA: Insufficient documentation

## 2020-12-09 DIAGNOSIS — R42 Dizziness and giddiness: Secondary | ICD-10-CM | POA: Diagnosis not present

## 2020-12-09 DIAGNOSIS — R0902 Hypoxemia: Secondary | ICD-10-CM | POA: Diagnosis not present

## 2020-12-09 DIAGNOSIS — R531 Weakness: Secondary | ICD-10-CM | POA: Diagnosis not present

## 2020-12-09 DIAGNOSIS — I10 Essential (primary) hypertension: Secondary | ICD-10-CM | POA: Diagnosis not present

## 2020-12-09 DIAGNOSIS — R079 Chest pain, unspecified: Secondary | ICD-10-CM | POA: Insufficient documentation

## 2020-12-09 DIAGNOSIS — R197 Diarrhea, unspecified: Secondary | ICD-10-CM | POA: Insufficient documentation

## 2020-12-09 DIAGNOSIS — R000 Tachycardia, unspecified: Secondary | ICD-10-CM | POA: Diagnosis not present

## 2020-12-09 DIAGNOSIS — Z5321 Procedure and treatment not carried out due to patient leaving prior to being seen by health care provider: Secondary | ICD-10-CM | POA: Insufficient documentation

## 2020-12-09 DIAGNOSIS — R110 Nausea: Secondary | ICD-10-CM | POA: Insufficient documentation

## 2020-12-09 DIAGNOSIS — S066X0A Traumatic subarachnoid hemorrhage without loss of consciousness, initial encounter: Secondary | ICD-10-CM | POA: Diagnosis not present

## 2020-12-09 DIAGNOSIS — S065X0A Traumatic subdural hemorrhage without loss of consciousness, initial encounter: Secondary | ICD-10-CM | POA: Diagnosis not present

## 2020-12-09 DIAGNOSIS — R001 Bradycardia, unspecified: Secondary | ICD-10-CM | POA: Diagnosis not present

## 2020-12-09 DIAGNOSIS — K449 Diaphragmatic hernia without obstruction or gangrene: Secondary | ICD-10-CM | POA: Diagnosis not present

## 2020-12-09 DIAGNOSIS — U071 COVID-19: Secondary | ICD-10-CM | POA: Diagnosis not present

## 2020-12-09 LAB — CBC
HCT: 47.2 % (ref 39.0–52.0)
Hemoglobin: 15.8 g/dL (ref 13.0–17.0)
MCH: 30.1 pg (ref 26.0–34.0)
MCHC: 33.5 g/dL (ref 30.0–36.0)
MCV: 89.9 fL (ref 80.0–100.0)
Platelets: 159 10*3/uL (ref 150–400)
RBC: 5.25 MIL/uL (ref 4.22–5.81)
RDW: 14.1 % (ref 11.5–15.5)
WBC: 11.8 10*3/uL — ABNORMAL HIGH (ref 4.0–10.5)
nRBC: 0 % (ref 0.0–0.2)

## 2020-12-09 LAB — TROPONIN I (HIGH SENSITIVITY)
Troponin I (High Sensitivity): 5 ng/L (ref <18)
Troponin I (High Sensitivity): 4 ng/L (ref <18)

## 2020-12-09 LAB — BASIC METABOLIC PANEL
Anion gap: 9 (ref 5–15)
BUN: 19 mg/dL (ref 8–23)
CO2: 27 mmol/L (ref 22–32)
Calcium: 8.8 mg/dL — ABNORMAL LOW (ref 8.9–10.3)
Chloride: 101 mmol/L (ref 98–111)
Creatinine, Ser: 1.18 mg/dL (ref 0.61–1.24)
GFR, Estimated: >60 mL/min (ref >60)
Glucose, Bld: 117 mg/dL — ABNORMAL HIGH (ref 70–99)
Potassium: 4.4 mmol/L (ref 3.5–5.1)
Sodium: 137 mmol/L (ref 135–145)

## 2020-12-09 NOTE — ED Triage Notes (Signed)
Pt comes into the ED via EMS from [facility], states he began having chest pain and had a syncopal episode, pt denies injury. Pt is a/ox4 at present, with NAD at present. . Denies any chest pain or other problems at present.

## 2020-12-10 ENCOUNTER — Inpatient Hospital Stay
Admission: EM | Admit: 2020-12-10 | Discharge: 2020-12-15 | DRG: 085 | Disposition: A | Discharge status: Home Health | Payer: Medicare HMO | Attending: Family Medicine | Admitting: Family Medicine

## 2020-12-10 ENCOUNTER — Other Ambulatory Visit: Payer: Self-pay

## 2020-12-10 ENCOUNTER — Observation Stay: Payer: Medicare HMO

## 2020-12-10 ENCOUNTER — Emergency Department: Payer: Medicare HMO

## 2020-12-10 ENCOUNTER — Encounter: Payer: Self-pay | Admitting: Emergency Medicine

## 2020-12-10 DIAGNOSIS — K449 Diaphragmatic hernia without obstruction or gangrene: Secondary | ICD-10-CM | POA: Diagnosis not present

## 2020-12-10 DIAGNOSIS — U071 COVID-19: Secondary | ICD-10-CM | POA: Diagnosis not present

## 2020-12-10 DIAGNOSIS — I609 Nontraumatic subarachnoid hemorrhage, unspecified: Secondary | ICD-10-CM | POA: Diagnosis not present

## 2020-12-10 DIAGNOSIS — I10 Essential (primary) hypertension: Secondary | ICD-10-CM | POA: Diagnosis not present

## 2020-12-10 DIAGNOSIS — S066X0A Traumatic subarachnoid hemorrhage without loss of consciousness, initial encounter: Secondary | ICD-10-CM | POA: Diagnosis present

## 2020-12-10 DIAGNOSIS — R42 Dizziness and giddiness: Secondary | ICD-10-CM

## 2020-12-10 DIAGNOSIS — R000 Tachycardia, unspecified: Secondary | ICD-10-CM | POA: Diagnosis not present

## 2020-12-10 DIAGNOSIS — Z79899 Other long term (current) drug therapy: Secondary | ICD-10-CM

## 2020-12-10 DIAGNOSIS — S065X9A Traumatic subdural hemorrhage with loss of consciousness of unspecified duration, initial encounter: Secondary | ICD-10-CM | POA: Diagnosis present

## 2020-12-10 DIAGNOSIS — S065X0A Traumatic subdural hemorrhage without loss of consciousness, initial encounter: Secondary | ICD-10-CM | POA: Diagnosis not present

## 2020-12-10 DIAGNOSIS — Z801 Family history of malignant neoplasm of trachea, bronchus and lung: Secondary | ICD-10-CM

## 2020-12-10 DIAGNOSIS — I4891 Unspecified atrial fibrillation: Secondary | ICD-10-CM | POA: Diagnosis present

## 2020-12-10 DIAGNOSIS — I639 Cerebral infarction, unspecified: Secondary | ICD-10-CM | POA: Diagnosis present

## 2020-12-10 DIAGNOSIS — I700 Atherosclerosis of aorta: Secondary | ICD-10-CM | POA: Diagnosis present

## 2020-12-10 DIAGNOSIS — Z7901 Long term (current) use of anticoagulants: Secondary | ICD-10-CM

## 2020-12-10 DIAGNOSIS — I6201 Nontraumatic acute subdural hemorrhage: Secondary | ICD-10-CM | POA: Diagnosis not present

## 2020-12-10 DIAGNOSIS — W19XXXA Unspecified fall, initial encounter: Secondary | ICD-10-CM | POA: Diagnosis not present

## 2020-12-10 DIAGNOSIS — Z820 Family history of epilepsy and other diseases of the nervous system: Secondary | ICD-10-CM

## 2020-12-10 DIAGNOSIS — E785 Hyperlipidemia, unspecified: Secondary | ICD-10-CM | POA: Diagnosis not present

## 2020-12-10 DIAGNOSIS — Z87891 Personal history of nicotine dependence: Secondary | ICD-10-CM

## 2020-12-10 DIAGNOSIS — S065XAA Traumatic subdural hemorrhage with loss of consciousness status unknown, initial encounter: Secondary | ICD-10-CM

## 2020-12-10 DIAGNOSIS — I493 Ventricular premature depolarization: Secondary | ICD-10-CM | POA: Diagnosis not present

## 2020-12-10 DIAGNOSIS — K219 Gastro-esophageal reflux disease without esophagitis: Secondary | ICD-10-CM | POA: Diagnosis present

## 2020-12-10 DIAGNOSIS — Z8673 Personal history of transient ischemic attack (TIA), and cerebral infarction without residual deficits: Secondary | ICD-10-CM

## 2020-12-10 DIAGNOSIS — R531 Weakness: Secondary | ICD-10-CM | POA: Diagnosis not present

## 2020-12-10 DIAGNOSIS — J439 Emphysema, unspecified: Secondary | ICD-10-CM | POA: Diagnosis present

## 2020-12-10 DIAGNOSIS — I480 Paroxysmal atrial fibrillation: Secondary | ICD-10-CM | POA: Diagnosis present

## 2020-12-10 HISTORY — DX: Cerebral infarction, unspecified: I63.9

## 2020-12-10 HISTORY — DX: Traumatic subdural hemorrhage with loss of consciousness of unspecified duration, initial encounter: S06.5X9A

## 2020-12-10 HISTORY — DX: Dizziness and giddiness: R42

## 2020-12-10 HISTORY — DX: Nontraumatic subarachnoid hemorrhage, unspecified: I60.9

## 2020-12-10 HISTORY — DX: Traumatic subdural hemorrhage with loss of consciousness status unknown, initial encounter: S06.5XAA

## 2020-12-10 HISTORY — DX: Unspecified atrial flutter: I48.92

## 2020-12-10 LAB — URINALYSIS, COMPLETE (UACMP) WITH MICROSCOPIC
Bacteria, UA: NONE SEEN
Bilirubin Urine: NEGATIVE
Glucose, UA: NEGATIVE mg/dL
Hgb urine dipstick: NEGATIVE
Ketones, ur: NEGATIVE mg/dL
Leukocytes,Ua: NEGATIVE
Nitrite: NEGATIVE
Protein, ur: NEGATIVE mg/dL
Specific Gravity, Urine: 1.017 (ref 1.005–1.030)
pH: 6 (ref 5.0–8.0)

## 2020-12-10 LAB — COMPREHENSIVE METABOLIC PANEL
ALT: 22 U/L (ref 0–44)
AST: 25 U/L (ref 15–41)
Albumin: 4.1 g/dL (ref 3.5–5.0)
Alkaline Phosphatase: 79 U/L (ref 38–126)
Anion gap: 10 (ref 5–15)
BUN: 19 mg/dL (ref 8–23)
CO2: 25 mmol/L (ref 22–32)
Calcium: 8.8 mg/dL — ABNORMAL LOW (ref 8.9–10.3)
Chloride: 102 mmol/L (ref 98–111)
Creatinine, Ser: 1.06 mg/dL (ref 0.61–1.24)
GFR, Estimated: >60 mL/min (ref >60)
Glucose, Bld: 112 mg/dL — ABNORMAL HIGH (ref 70–99)
Potassium: 4 mmol/L (ref 3.5–5.1)
Sodium: 137 mmol/L (ref 135–145)
Total Bilirubin: 1.2 mg/dL (ref 0.3–1.2)
Total Protein: 6.8 g/dL (ref 6.5–8.1)

## 2020-12-10 LAB — CBC WITH DIFFERENTIAL/PLATELET
Abs Immature Granulocytes: 0.03 10*3/uL (ref 0.00–0.07)
Basophils Absolute: 0.1 10*3/uL (ref 0.0–0.1)
Basophils Relative: 1 %
Eosinophils Absolute: 0 10*3/uL (ref 0.0–0.5)
Eosinophils Relative: 0 %
HCT: 47.5 % (ref 39.0–52.0)
Hemoglobin: 15.8 g/dL (ref 13.0–17.0)
Immature Granulocytes: 0 %
Lymphocytes Relative: 13 %
Lymphs Abs: 1.2 10*3/uL (ref 0.7–4.0)
MCH: 30 pg (ref 26.0–34.0)
MCHC: 33.3 g/dL (ref 30.0–36.0)
MCV: 90.3 fL (ref 80.0–100.0)
Monocytes Absolute: 1.1 10*3/uL — ABNORMAL HIGH (ref 0.1–1.0)
Monocytes Relative: 11 %
Neutro Abs: 7.1 10*3/uL (ref 1.7–7.7)
Neutrophils Relative %: 75 %
Platelets: 146 10*3/uL — ABNORMAL LOW (ref 150–400)
RBC: 5.26 MIL/uL (ref 4.22–5.81)
RDW: 14.5 % (ref 11.5–15.5)
WBC: 9.5 10*3/uL (ref 4.0–10.5)
nRBC: 0 % (ref 0.0–0.2)

## 2020-12-10 LAB — TROPONIN I (HIGH SENSITIVITY): Troponin I (High Sensitivity): 8 ng/L (ref <18)

## 2020-12-10 LAB — SARS CORONAVIRUS 2 (TAT 6-24 HRS): SARS Coronavirus 2: POSITIVE — AB

## 2020-12-10 MED ORDER — ONDANSETRON HCL 4 MG/2ML IJ SOLN: 4.0000 mg | Freq: Three times a day (TID) | INTRAMUSCULAR | Status: DC | PRN

## 2020-12-10 MED ORDER — PROTHROMBIN COMPLEX CONC HUMAN 500 UNITS IV KIT
4794.0000 [IU] | PACK | Status: DC
  Filled 2020-12-10: qty 4794

## 2020-12-10 MED ORDER — ONDANSETRON HCL 4 MG PO TABS: 4.0000 mg | ORAL_TABLET | Freq: Four times a day (QID) | ORAL | Status: DC | PRN

## 2020-12-10 MED ORDER — MECLIZINE HCL 25 MG PO TABS
25.0000 mg | ORAL_TABLET | Freq: Three times a day (TID) | ORAL | Status: DC | PRN
  Administered 2020-12-13: 25 mg via ORAL
  Filled 2020-12-10 (×4): qty 1

## 2020-12-10 MED ORDER — ONDANSETRON HCL 4 MG/2ML IJ SOLN
4.0000 mg | Freq: Once | INTRAMUSCULAR | Status: AC
  Administered 2020-12-10: 4 mg via INTRAVENOUS
  Filled 2020-12-10: qty 2

## 2020-12-10 MED ORDER — SODIUM CHLORIDE 0.9 % IV SOLN: INTRAVENOUS | Status: DC

## 2020-12-10 MED ORDER — ACETAMINOPHEN 325 MG PO TABS
650.0000 mg | ORAL_TABLET | Freq: Four times a day (QID) | ORAL | Status: DC | PRN
  Administered 2020-12-11: 650 mg via ORAL
  Filled 2020-12-10: qty 2

## 2020-12-10 MED ORDER — SODIUM CHLORIDE 0.9 % IV SOLN
1000.0000 mL | Freq: Once | INTRAVENOUS | Status: AC
  Administered 2020-12-10: 1000 mL via INTRAVENOUS

## 2020-12-10 MED ORDER — HYDRALAZINE HCL 20 MG/ML IJ SOLN
5.0000 mg | INTRAMUSCULAR | Status: DC | PRN
  Administered 2020-12-10 – 2020-12-11 (×5): 5 mg via INTRAVENOUS
  Filled 2020-12-10 (×4): qty 1

## 2020-12-10 NOTE — H&P (Signed)
History and Physical    Peter Castro RPR:[id number] DOB: [birth year] DOA: 12/10/2020  Referring MD/NP/PA:   PCP: McLean-Scocuzza, Pasty Spillers, MD   Patient coming from:  The patient is coming from home.  At baseline, pt is independent for most of ADL.        Chief Complaint: dizziness, fall  HPI: Peter Castro is a 75 y.o. male with medical history significant of HTN, HLD, stroke, GERD, loss of memory, atrial fibrillation on Eliquis, who presents with dizziness and fall.  Patient states that he has been having lightheadedness and dizziness in the past 2 days.  He does not feel room spinning.  No ear ringing.  Patient does not have vision loss or hearing loss.  No unilateral numbness, tingling in extremities.  No facial droop or slurred speech.  He came to the ED yesterday, but left hospital before being seen. He states that went to [facility] and fell yesterday.  He denies head or neck injury. He states that he continues to have dizziness.  Denies headache, neck pain.  Not have chest pain, shortness breath, cough, fever or chills.  No nausea, vomiting, diarrhea, abdominal pain, symptoms of UTI.  ED Course: pt was found to have WBC 9.5, pending COVID-19 PCR, electrolytes renal function okay, temperature normal, blood pressure 158/93, heart rate 68, RR 16, oxygen saturation 93-96% on room air.  Chest x-ray negative.  CT of head showed SDH and SAH.  Patient is placed on stepdown bed for observation.  Dr. Adriana Simas of neurosurgery is consulted.  CT of head and C spin: 1. Acute subdural hematoma along the falx and right tentorium, up to  mm in thickness. Trace adjacent subarachnoid hemorrhage. 2. Remote left MCA branch infarct. 3. Stable 1 cm nodule in the right cerebral white matter again favoring cavernoma or other benign process. 4. Negative for cervical spine fracture.  Review of Systems:   General: no fevers, chills, no body weight gain, fatigue HEENT: no blurry vision, hearing changes  or sore throat Respiratory: no dyspnea, coughing, wheezing CV: no chest pain, no palpitations GI: no nausea, vomiting, abdominal pain, diarrhea, constipation GU: no dysuria, burning on urination, increased urinary frequency, hematuria  Ext: no leg edema Neuro: no unilateral weakness, numbness, or tingling, no vision change or hearing loss. Has dizziness and lightheadedness. Skin: no rash, no skin tear. MSK: No muscle spasm, no deformity, no limitation of range of movement in spin Heme: No easy bruising.  Travel history: No recent long distant travel.  Allergy: No Known Allergies  Past Medical History:  Diagnosis Date  . A-fib (HCC)   . Arthritis   . GERD (gastroesophageal reflux disease)   . History of chicken pox   . Hyperlipidemia   . Hypertension   . Loss of memory   . Stroke [facility])    ~2013/14    Past Surgical History:  Procedure Laterality Date  . APPENDECTOMY    . OTHER SURGICAL HISTORY     cryoballon pvi isthmus tricuspid ablation Dr. Kathrynn Ducking 01/2014 [place]     Social History:  reports that he has quit smoking. He has never used smokeless tobacco. He reports current alcohol use. He reports previous drug use.  Family History:  Family History  Problem Relation Age of Onset  . Dementia Mother   . Cancer Father        lung smoker      Prior to Admission medications   Medication Sig Start Date End Date Taking? Authorizing Provider  amLODipine (  NORVASC) 5 MG tablet Take 1 tablet (5 mg total) by mouth daily as needed. 02/07/20   McLean-Scocuzza, Pasty Spillersracy N, MD  atorvastatin (LIPITOR) 10 MG tablet Take 1 tablet (10 mg total) by mouth daily at 6 PM. 10/11/20   McLean-Scocuzza, Pasty Spillersracy N, MD  carvedilol (COREG) 25 MG tablet Take 1 tablet (25 mg total) by mouth 2 (two) times daily with a meal. 02/07/20   McLean-Scocuzza, Pasty Spillersracy N, MD  ELIQUIS 5 MG TABS tablet TAKE ONE TABLET BY MOUTH TWICE A DAY 11/11/20   Antonieta IbaGollan, Timothy J, MD  flecainide (TAMBOCOR) 100 MG tablet TAKE ONE TABLET  BY MOUTH TWICE A DAY 03/05/20   Antonieta IbaGollan, Timothy J, MD  lisinopril (ZESTRIL) 10 MG tablet Take 1 tablet (10 mg total) by mouth daily. 10/11/20   McLean-Scocuzza, Pasty Spillersracy N, MD  oxyCODONE-acetaminophen (PERCOCET) 5-325 MG tablet Take 1 tablet by mouth every 8 (eight) hours as needed for severe pain. 10/16/20 10/16/21  McLean-Scocuzza, Pasty Spillersracy N, MD    Physical Exam: Vitals:   12/10/20 1200 12/10/20 1500 12/10/20 1530 12/10/20 1616  BP: (!) 144/87 (!) 159/89 (!) 147/76 (!) 158/91  Pulse: 64  65 70  Resp:  14 (!) 22 16  Temp:      TempSrc:      SpO2: 96%  96% 96%  Weight:      Height:       General: Not in acute distress HEENT:       Eyes: PERRL, EOMI, no scleral icterus.       ENT: No discharge from the ears and nose, no pharynx injection, no tonsillar enlargement.        Neck: No JVD, no bruit, no mass felt. Heme: No neck lymph node enlargement. Cardiac: S1/S2, RRR, No murmurs, No gallops or rubs. Respiratory: No rales, wheezing, rhonchi or rubs. GI: Soft, nondistended, nontender, no rebound pain, no organomegaly, BS present. GU: No hematuria Ext: No pitting leg edema bilaterally. 1+DP/PT pulse bilaterally. Musculoskeletal: No joint deformities, No joint redness or warmth, no limitation of ROM in spin. Skin: No rashes.  Neuro: Alert, oriented X3, cranial nerves II-XII grossly intact, moves all extremities normally.  Psych: Patient is not psychotic, no suicidal or hemocidal ideation.  Labs on Admission: I have personally reviewed following labs and imaging studies  CBC: Recent Labs  Lab 12/09/20 1000 12/10/20 0658  WBC 11.8* 9.5  NEUTROABS  --  7.1  HGB 15.8 15.8  HCT 47.2 47.5  MCV 89.9 90.3  PLT 159 146*   Basic Metabolic Panel: Recent Labs  Lab 12/09/20 1000 12/10/20 0658  NA 137 137  K 4.4 4.0  CL 101 102  CO2 27 25  GLUCOSE 117* 112*  BUN 19 19  CREATININE 1.18 1.06  CALCIUM 8.8* 8.8*   GFR: Estimated Creatinine Clearance: 62.4 mL/min (by C-G formula based on  SCr of 1.06 mg/dL). Liver Function Tests: Recent Labs  Lab 12/10/20 0658  AST 25  ALT 22  ALKPHOS 79  BILITOT 1.2  PROT 6.8  ALBUMIN 4.1   No results for input(s): LIPASE, AMYLASE in the last 168 hours. No results for input(s): AMMONIA in the last 168 hours. Coagulation Profile: No results for input(s): INR, PROTIME in the last 168 hours. Cardiac Enzymes: No results for input(s): CKTOTAL, CKMB, CKMBINDEX, TROPONINI in the last 168 hours. BNP (last 3 results) No results for input(s): PROBNP in the last 8760 hours. HbA1C: No results for input(s): HGBA1C in the last 72 hours. CBG: No results for input(s): GLUCAP  in the last 168 hours. Lipid Profile: No results for input(s): CHOL, HDL, LDLCALC, TRIG, CHOLHDL, LDLDIRECT in the last 72 hours. Thyroid Function Tests: No results for input(s): TSH, T4TOTAL, FREET4, T3FREE, THYROIDAB in the last 72 hours. Anemia Panel: No results for input(s): VITAMINB12, FOLATE, FERRITIN, TIBC, IRON, RETICCTPCT in the last 72 hours. Urine analysis:    Component Value Date/Time   COLORURINE YELLOW (A) 12/10/2020 1120   APPEARANCEUR CLEAR (A) 12/10/2020 1120   APPEARANCEUR Clear 12/16/2018 0857   LABSPEC 1.017 12/10/2020 1120   PHURINE 6.0 12/10/2020 1120   GLUCOSEU NEGATIVE 12/10/2020 1120   HGBUR NEGATIVE 12/10/2020 1120   BILIRUBINUR NEGATIVE 12/10/2020 1120   BILIRUBINUR Negative 12/16/2018 0857   KETONESUR NEGATIVE 12/10/2020 1120   PROTEINUR NEGATIVE 12/10/2020 1120   NITRITE NEGATIVE 12/10/2020 1120   LEUKOCYTESUR NEGATIVE 12/10/2020 1120   Sepsis Labs: @LABRCNTIP (procalcitonin:4,lacticidven:4) )No results found for this or any previous visit (from the past 240 hour(s)).   Radiological Exams on Admission: CT Head Wo Contrast  Result Date: 12/10/2020 CLINICAL DATA:  Dizziness with fall. EXAM: CT HEAD WITHOUT CONTRAST CT CERVICAL SPINE WITHOUT CONTRAST TECHNIQUE: Multidetector CT imaging of the head and cervical spine was performed  following the standard protocol without intravenous contrast. Multiplanar CT image reconstructions of the cervical spine were also generated. COMPARISON:  10/13/2020 FINDINGS: CT HEAD FINDINGS Brain: Large remote left MCA branch infarct affecting the high and lateral left frontal lobe. High-density nodule in the right frontal parietal white matter measuring 1 cm and showing internal coarse calcification. No perilesional edema or interval change. High-density subdural hematoma along the falx and right tentorium which measures up to 4 mm in thickness. Trace subarachnoid hemorrhage along the inferior right occipital lobe. No evidence of acute infarct.  No hydrocephalus or masslike finding Vascular: Atheromatous calcification Skull: Negative Sinuses/Orbits: Negative Critical Value/emergent results were called by telephone at the time of interpretation on 12/10/2020 at 10:28 am to provider 02/07/2021 , who verbally acknowledged these results. CT CERVICAL SPINE FINDINGS Alignment: No traumatic malalignment Skull base and vertebrae: No acute fracture Soft tissues and spinal canal: No prevertebral fluid or swelling. No visible canal hematoma. Disc levels: Generalized disc space narrowing and endplate ridging. 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 Multilevel foraminal narrowing Upper chest: Emphysematous spaces. IMPRESSION: 1. Acute subdural hematoma along the falx and right tentorium, up to 4 mm in thickness. Trace adjacent subarachnoid hemorrhage. 2. Remote left MCA branch infarct. 3. Stable 1 cm nodule in the right cerebral white matter again favoring cavernoma or other benign process. 4. Negative for cervical spine fracture. Electronically Signed   By: Jene Every M.D.   On: 12/10/2020 10:33   CT CERVICAL SPINE WO CONTRAST  Result Date: 12/10/2020 CLINICAL DATA:  Dizziness with fall. EXAM: CT HEAD WITHOUT CONTRAST CT CERVICAL SPINE WITHOUT CONTRAST TECHNIQUE: Multidetector CT imaging of  the head and cervical spine was performed following the standard protocol without intravenous contrast. Multiplanar CT image reconstructions of the cervical spine were also generated. COMPARISON:  10/13/2020 FINDINGS: CT HEAD FINDINGS Brain: Large remote left MCA branch infarct affecting the high and lateral left frontal lobe. High-density nodule in the right frontal parietal white matter measuring 1 cm and showing internal coarse calcification. No perilesional edema or interval change. High-density subdural hematoma along the falx and right tentorium which measures up to 4 mm in thickness. Trace subarachnoid hemorrhage along the inferior right occipital lobe. No evidence of acute infarct.  No hydrocephalus or masslike finding Vascular: Atheromatous calcification  Skull: Negative Sinuses/Orbits: Negative Critical Value/emergent results were called by telephone at the time of interpretation on 12/10/2020 at 10:28 am to provider Jene Every , who verbally acknowledged these results. CT CERVICAL SPINE FINDINGS Alignment: No traumatic malalignment Skull base and vertebrae: No acute fracture Soft tissues and spinal canal: No prevertebral fluid or swelling. No visible canal hematoma. Disc levels: Generalized disc space narrowing and endplate ridging. 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 Multilevel foraminal narrowing Upper chest: Emphysematous spaces. IMPRESSION: 1. Acute subdural hematoma along the falx and right tentorium, up to 4 mm in thickness. Trace adjacent subarachnoid hemorrhage. 2. Remote left MCA branch infarct. 3. Stable 1 cm nodule in the right cerebral white matter again favoring cavernoma or other benign process. 4. Negative for cervical spine fracture. Electronically Signed   By: Marnee Spring M.D.   On: 12/10/2020 10:33   DG Chest Port 1 View  Result Date: 12/10/2020 CLINICAL DATA:  Weakness. EXAM: PORTABLE CHEST 1 VIEW COMPARISON:  Chest/rib radiographs and chest CT  10/13/2020 FINDINGS: The cardiac silhouette is within normal limits for portable AP technique. A moderate-sized sliding hiatal hernia and thoracic aortic atherosclerosis are noted. There is mild chronic coarsening of the interstitial markings. No acute airspace consolidation, edema, pleural effusion, or pneumothorax is identified. No acute osseous abnormality is seen. IMPRESSION: No active disease. Electronically Signed   By: Sebastian Ache M.D.   On: 12/10/2020 08:53     EKG: I have personally reviewed.  QTC 438, nonspecific T wave change, artificial effect.  Assessment/Plan Principal Problem:   SDH (subdural hematoma) (HCC) Active Problems:   Atrial fibrillation (HCC)   Essential hypertension   Hyperlipidemia   Fall   Stroke [facility])   GERD (gastroesophageal reflux disease)   Dizziness   SAH (subarachnoid hemorrhage) (HCC)   SDH (subdural hematoma) and SAH (subarachnoid hemorrhage): Patient seems to be asymptomatic.  No headache.  Mental status normal.  Dr. Adriana Simas of neurosurgery is consulted. He recommends holding Eliquis for 1 week and no need to give Kcentra at this time.  CT head was repeated per Dr. Adriana Simas, which was done at 17:00, and showed:   1. Stable appearance of acute subdural hematoma along the falx and right tentorium, not significantly changed from earlier today. This measures up to 4 mm in thickness along the right tentorium. 2. Adjacent right occipital subarachnoid hemorrhage with slight increase from earlier today. 3. Otherwise stable exam.  Remote left MCA infarct.  -will place on stepdown bed for observation -Frequent neuro check -Hold Eliquis -Repeat CT scan at 6 AM  Atrial fibrillation (HCC) -Hold Eliquis -Coreg, flecainide  Essential hypertension -Amlodipine, Coreg, lisinopril, -IV hydralazine as needed for SBP> 140  Hyperlipidemia -Lipitor  Fall -PT/OT  History of stroke (HCC) -Lipitor  Dizziness: Patient has been having dizziness for 2 days which  started before his fall and head injury.  Etiology is not clear.  Will need to rule out possibility of posterior circulation stroke -As needed meclizine -Follow-up MRI of brain -PT/OT        DVT ppx: SCD Code Status: Full code Family Communication:    Yes, patient's daughter-in-law by phone  disposition Plan:  Anticipate discharge back to previous environment Consults called:  Dr. Adriana Simas of neurosurgery Admission status and Level of care: SDU/obs   Status is: Observation  The patient remains OBS appropriate and will d/c before 2 midnights.  Dispo: The patient is from: Home  Anticipated d/c is to: Home              Anticipated d/c date is: 1 day              Patient currently is not medically stable to d/c.   Difficult to place patient No          Date of Service 12/10/2020    Lorretta Harp [facility]   If 7PM-7AM, please contact night-coverage [email or web address] 12/10/2020, 4:32 PM

## 2020-12-10 NOTE — ED Notes (Signed)
Pt gone to MRI 

## 2020-12-10 NOTE — ED Notes (Signed)
Patient wanted to get up to bsc to move bowels.  Then he wanted me to leave3.  He is very unsteady, so I told him I had to stay.  He got back on the bed.  Denies pain, but says he has nausea.

## 2020-12-10 NOTE — ED Provider Notes (Addendum)
[facility] Emergency Department Provider Note   ____________________________________________    I have reviewed the triage vital signs and the nursing notes.   HISTORY  Chief Complaint Dizziness     HPI Peter Castro is a 75 y.o. male who presents with complaints of dizziness.  Patient reports he has been feeling dizzy and lightheaded over the last 1 to 2 days.  He reports yesterday he went to [facility] and apparently fell although he denies head injury or any other injury from that fall.  He apparently came to the emergency department yesterday but left that being seen.  Denies nausea or vomiting.  Denies neuro deficits.  No fevers chills or cough reported.  No new medications  Past Medical History:  Diagnosis Date  . A-fib (HCC)   . Arthritis   . GERD (gastroesophageal reflux disease)   . History of chicken pox   . Hyperlipidemia   . Hypertension   . Loss of memory   . Stroke [facility])    ~2013/14    Patient Active Problem List   Diagnosis Date Noted  . Aortic atherosclerosis (HCC) 10/14/2020  . Left rib fracture 10/14/2020  . Emphysema lung (HCC) 10/14/2020  . Hiatal hernia 10/14/2020  . Elevated PSA 11/02/2019  . Atrial fibrillation (HCC) 12/08/2018  . Essential hypertension 12/08/2018  . Hyperlipidemia 12/08/2018  . Cerebrovascular accident (CVA) (HCC) 12/08/2018  . Memory loss 12/08/2018    Past Surgical History:  Procedure Laterality Date  . APPENDECTOMY    . OTHER SURGICAL HISTORY     cryoballon pvi isthmus tricuspid ablation Dr. Kathrynn Ducking 01/2014 [place]     Prior to Admission medications   Medication Sig Start Date End Date Taking? Authorizing Provider  amLODipine (NORVASC) 5 MG tablet Take 1 tablet (5 mg total) by mouth daily as needed. 02/07/20   McLean-Scocuzza, Pasty Spillers, MD  atorvastatin (LIPITOR) 10 MG tablet Take 1 tablet (10 mg total) by mouth daily at 6 PM. 10/11/20   McLean-Scocuzza, Pasty Spillers, MD  carvedilol  (COREG) 25 MG tablet Take 1 tablet (25 mg total) by mouth 2 (two) times daily with a meal. 02/07/20   McLean-Scocuzza, Pasty Spillers, MD  ELIQUIS 5 MG TABS tablet TAKE ONE TABLET BY MOUTH TWICE A DAY 11/11/20   Antonieta Iba, MD  flecainide (TAMBOCOR) 100 MG tablet TAKE ONE TABLET BY MOUTH TWICE A DAY 03/05/20   Antonieta Iba, MD  lisinopril (ZESTRIL) 10 MG tablet Take 1 tablet (10 mg total) by mouth daily. 10/11/20   McLean-Scocuzza, Pasty Spillers, MD  oxyCODONE-acetaminophen (PERCOCET) 5-325 MG tablet Take 1 tablet by mouth every 8 (eight) hours as needed for severe pain. 10/16/20 10/16/21  McLean-Scocuzza, Pasty Spillers, MD     Allergies Patient has no known allergies.  Family History  Problem Relation Age of Onset  . Dementia Mother   . Cancer Father        lung smoker     Social History Social History   Tobacco Use  . Smoking status: Former Games developer  . Smokeless tobacco: Never Used  Vaping Use  . Vaping Use: Never used  Substance Use Topics  . Alcohol use: Yes    Comment: occassionally-wine  . Drug use: Not Currently    Review of Systems  Constitutional: No fever/chills Eyes: No visual changes.  ENT: No sore throat. Cardiovascular: Denies chest pain. Respiratory: Denies shortness of breath. Gastrointestinal: No abdominal pain.   Genitourinary: Negative for dysuria. Musculoskeletal: Negative for back pain.  Skin: Negative for rash. Neurological: Negative for headaches   ____________________________________________   PHYSICAL EXAM:  VITAL SIGNS: ED Triage Vitals  Enc Vitals Group     BP 12/10/20 0653 122/72     Pulse Rate 12/10/20 0653 70     Resp 12/10/20 0653 16     Temp 12/10/20 0653 98.6 F (37 C)     Temp Source 12/10/20 0653 Oral     SpO2 12/10/20 0636 96 %     Weight 12/10/20 0655 88 kg (194 lb)     Height 12/10/20 0655 1.651 m (5\' 5" )     Head Circumference --      Peak Flow --      Pain Score 12/10/20 0654 0     Pain Loc --      Pain Edu? --      Excl. in  GC? --    Constitutional: Alert and oriented. Eyes: Conjunctivae are normal.  Head: Atraumatic. Nose: No congestion/rhinnorhea. Mouth/Throat: Mucous membranes are moist.    Cardiovascular: Normal rate Grossly normal heart sounds.  Good peripheral circulation. Respiratory: Normal respiratory effort.  No retractions. Lungs CTAB. Gastrointestinal: Soft and nontender. No distention.  No CVA tenderness.  Musculoskeletal: No lower extremity tenderness nor edema.  Warm and well perfused Neurologic:  Normal speech and language. No gross focal neurologic deficits are appreciated.  Skin:  Skin is warm, dry and intact. No rash noted. Psychiatric: Mood and affect are normal. Speech and behavior are normal.  ____________________________________________   LABS (all labs ordered are listed, but only abnormal results are displayed)  Labs Reviewed  CBC WITH DIFFERENTIAL/PLATELET - Abnormal; Notable for the following components:      Result Value   Platelets 146 (*)    Monocytes Absolute 1.1 (*)    All other components within normal limits  COMPREHENSIVE METABOLIC PANEL - Abnormal; Notable for the following components:   Glucose, Bld 112 (*)    Calcium 8.8 (*)    All other components within normal limits  URINALYSIS, COMPLETE (UACMP) WITH MICROSCOPIC - Abnormal; Notable for the following components:   Color, Urine YELLOW (*)    APPearance CLEAR (*)    All other components within normal limits  SARS CORONAVIRUS 2 (TAT 6-24 HRS)  TROPONIN I (HIGH SENSITIVITY)   ____________________________________________  EKG  ED ECG REPORT I, 02/07/21, the attending physician, personally viewed and interpreted this ECG.  Date: 12/10/2020  Rhythm: normal sinus rhythm QRS Axis: normal Intervals: normal ST/T Wave abnormalities: normal Narrative Interpretation: no evidence of acute ischemia  ____________________________________________  RADIOLOGY  CT head reviewed by me, small subdural  noted Chest x-ray ____________________________________________   PROCEDURES  Procedure(s) performed: No  Procedures   Critical Care performed: yes  CRITICAL CARE Performed by: 02/07/2021   Total critical care time:30 minutes  Critical care time was exclusive of separately billable procedures and treating other patients.  Critical care was necessary to treat or prevent imminent or life-threatening deterioration.  Critical care was time spent personally by me on the following activities: development of treatment plan with patient and/or surrogate as well as nursing, discussions with consultants, evaluation of patient's response to treatment, examination of patient, obtaining history from patient or surrogate, ordering and performing treatments and interventions, ordering and review of laboratory studies, ordering and review of radiographic studies, pulse oximetry and re-evaluation of patient's condition.  ____________________________________________   INITIAL IMPRESSION / ASSESSMENT AND PLAN / ED COURSE  Pertinent labs & imaging results that were available during  my care of the patient were reviewed by me and considered in my medical decision making (see chart for details).  Patient presents with dizziness and lightheadedness.  He describes it as feeling very lightheaded especially when standing.  He feels okay when he is lying down.  Denies new medications.  No palpitations or chest pain.  No neuro deficits.  No headache  We will give IV fluids check orthostatics.  Lab work is overall reassuring  Upon trying to stand the patient for orthostatics he was very off balance.  We will send for CT head.  Notified by radiology of small subdural on head CT.  Discussed Dr. Adriana Simas of neurosurgery who recommends repeat CT scan in 6 to 8 hours, appropriate for admission here to medicine service for evaluation of lightheadedness and dizziness.  Have determined that patient is on Eliquis  for atrial fibrillation, I had ordered Kcentra however Dr. Adriana Simas of neurosurgery recommends simply holding Eliquis for 1 week and no need for Kcentra at this time        ____________________________________________   FINAL CLINICAL IMPRESSION(S) / ED DIAGNOSES  Final diagnoses:  Dizziness  SDH (subdural hematoma) (HCC)        Note:  This document was prepared using Dragon voice recognition software and may include unintentional dictation errors.   Jene Every, MD 12/10/20 Bartolo Darter    Jene Every, MD 12/10/20 1226

## 2020-12-10 NOTE — ED Notes (Signed)
Pt given urinal.

## 2020-12-10 NOTE — ED Notes (Addendum)
Pharmacy called and states that they were just informed that pt would not be getting medication

## 2020-12-10 NOTE — ED Notes (Signed)
Took over care of pt. Pt resting comfortably and in NAD at this time. VSS. Awaiting further orders. Will continue to monitor.  

## 2020-12-10 NOTE — ED Triage Notes (Addendum)
EMS brings pt in from home for c/o dizziness; here for same yesterday but left before being seen

## 2020-12-10 NOTE — ED Notes (Signed)
Pt resting comfortably at this time. Will perform swallow screen shortly.

## 2020-12-10 NOTE — ED Notes (Signed)
Pt too unsteady to complete orthostatic VS at this time

## 2020-12-11 ENCOUNTER — Observation Stay: Payer: Medicare HMO

## 2020-12-11 DIAGNOSIS — W19XXXA Unspecified fall, initial encounter: Secondary | ICD-10-CM | POA: Diagnosis not present

## 2020-12-11 DIAGNOSIS — R930 Abnormal findings on diagnostic imaging of skull and head, not elsewhere classified: Secondary | ICD-10-CM | POA: Diagnosis not present

## 2020-12-11 DIAGNOSIS — R42 Dizziness and giddiness: Secondary | ICD-10-CM | POA: Diagnosis not present

## 2020-12-11 DIAGNOSIS — I609 Nontraumatic subarachnoid hemorrhage, unspecified: Secondary | ICD-10-CM | POA: Diagnosis not present

## 2020-12-11 DIAGNOSIS — Z0389 Encounter for observation for other suspected diseases and conditions ruled out: Secondary | ICD-10-CM | POA: Diagnosis not present

## 2020-12-11 DIAGNOSIS — S065X9A Traumatic subdural hemorrhage with loss of consciousness of unspecified duration, initial encounter: Secondary | ICD-10-CM | POA: Diagnosis not present

## 2020-12-11 DIAGNOSIS — S066X0A Traumatic subarachnoid hemorrhage without loss of consciousness, initial encounter: Secondary | ICD-10-CM | POA: Diagnosis not present

## 2020-12-11 DIAGNOSIS — I10 Essential (primary) hypertension: Secondary | ICD-10-CM

## 2020-12-11 DIAGNOSIS — I4891 Unspecified atrial fibrillation: Secondary | ICD-10-CM | POA: Diagnosis not present

## 2020-12-11 DIAGNOSIS — I639 Cerebral infarction, unspecified: Secondary | ICD-10-CM | POA: Diagnosis not present

## 2020-12-11 DIAGNOSIS — I480 Paroxysmal atrial fibrillation: Secondary | ICD-10-CM | POA: Diagnosis not present

## 2020-12-11 DIAGNOSIS — S065X0A Traumatic subdural hemorrhage without loss of consciousness, initial encounter: Secondary | ICD-10-CM | POA: Diagnosis not present

## 2020-12-11 DIAGNOSIS — I6200 Nontraumatic subdural hemorrhage, unspecified: Secondary | ICD-10-CM | POA: Diagnosis not present

## 2020-12-11 DIAGNOSIS — E785 Hyperlipidemia, unspecified: Secondary | ICD-10-CM | POA: Diagnosis not present

## 2020-12-11 MED ORDER — GADOBUTROL 1 MMOL/ML IV SOLN
7.5000 mL | Freq: Once | INTRAVENOUS | Status: AC | PRN
  Administered 2020-12-11: 7.5 mL via INTRAVENOUS
  Filled 2020-12-11: qty 7.5

## 2020-12-11 MED ORDER — SODIUM CHLORIDE 0.9 % IV SOLN
200.0000 mg | Freq: Once | INTRAVENOUS | Status: AC
  Administered 2020-12-11: 200 mg via INTRAVENOUS
  Filled 2020-12-11: qty 200

## 2020-12-11 MED ORDER — SODIUM CHLORIDE 0.9 % IV SOLN
100.0000 mg | Freq: Every day | INTRAVENOUS | Status: AC
  Administered 2020-12-12 – 2020-12-13 (×2): 100 mg via INTRAVENOUS
  Filled 2020-12-11 (×2): qty 20

## 2020-12-11 NOTE — Progress Notes (Signed)
[facility]  PROGRESS NOTE  Peter Castro QMG:[id number] DOB: 1946/03/03 DOA: 12/10/2020 PCP: McLean-Scocuzza, Pasty Spillers, MD   Brief HPI:   75 year old male with history of hypertension, hyperlipidemia, stroke, GERD, atrial fibrillation on Eliquis presented with dizziness and fall.  Patient said that he has been having dizziness for past 2 days denied visual loss or hearing loss.  No numbness or tingling in extremities.  No facial droop or slurred speech.  He came to ED yesterday but left ED before being seen.  He then went to [facility] and fell.  He denied head or neck injury.  He continued to have dizziness.  In the ED, lab work showed positive COVID-19 PCR.  CT head showed SDH and SAH.  Neurosurgery Dr. Adriana Simas was consulted and he recommended to hold Eliquis for 1 week.  Subjective   Patient seen and examined, denies shortness of breath.  Complains of headache.  Is currently not requiring oxygen.   Assessment/Plan:     1. SDH/SAH-patient complains of headache.  Dr. Adriana Simas from neurosurgery was consulted, he recommended holding Eliquis for 1 week.  No need to give Kcentra at this time.  CT head was repeated per Dr. Adriana Simas which was done around 5 PM yesterday.  CT head showed stable appearance of acute SDH along the falx right tentorium, not significantly changed from previous CT.  This measures up to 4 mm in thickness along the right tentorium.  Adjacent right occipital subdural hemorrhage with slight increase from earlier.  Continue frequent neurochecks, Eliquis on hold.  Repeat CT head this morning again showed stable for millimeter right parafalcine/tentorial subdural hematoma.  Stable trace right occipital subarachnoid hemorrhage. 2. Dizziness-MRI brain showed small volume subdural hemorrhage along the dependent posterior right cerebral convexity, posterior falx and right tentorium.  Adjacent right occipital subarachnoid hemorrhage.  Diminished right sigmoid sinus flow void probably  reflects slow flow.  Recommend to obtain MRV for confirmation.  Will obtain orthostatic vital signs every shift.Continue monitoring on telemetry.  Continue as needed meclizine. 3. Positive COVID-19 PCR-COVID-19 PCR was positive, patient is asymptomatic, not requiring oxygen.  Does not have other symptoms of COVID-19 infection, no nausea vomiting or diarrhea.  No loss of taste or smell.  We will continue to monitor for  symptoms.  Will start patient on remdesivir.  He does have history of stroke, hypertension, hyperlipidemia and his age puts him at risk for serious complications. 4. Atrial fibrillation-Eliquis on hold as per neurosurgery for 1 week.  Continue Coreg, flecainide.  Monitor on telemetry. 5. Hypertension-continue multipin, Coreg, lisinopril.  Continue IV hydralazine as needed for SBP more than 140. 6. Hyperlipidemia-continue Lipitor.     COVID-19 Labs  No results for input(s): DDIMER, FERRITIN, LDH, CRP in the last 72 hours.  Lab Results  Component Value Date   SARSCOV2NAA POSITIVE (A) 12/10/2020     Scheduled medications:         CBG: No results for input(s): GLUCAP in the last 168 hours.  SpO2: 92 %    CBC: Recent Labs  Lab 12/09/20 1000 12/10/20 0658  WBC 11.8* 9.5  NEUTROABS  --  7.1  HGB 15.8 15.8  HCT 47.2 47.5  MCV 89.9 90.3  PLT 159 146*    Basic Metabolic Panel: Recent Labs  Lab 12/09/20 1000 12/10/20 0658  NA 137 137  K 4.4 4.0  CL 101 102  CO2 27 25  GLUCOSE 117* 112*  BUN 19 19  CREATININE 1.18 1.06  CALCIUM 8.8* 8.8*  Liver Function Tests: Recent Labs  Lab 12/10/20 0658  AST 25  ALT 22  ALKPHOS 79  BILITOT 1.2  PROT 6.8  ALBUMIN 4.1     Antibiotics: Anti-infectives (From admission, onward)   None       DVT prophylaxis: SCDs  Code Status: Full code  Family Communication: No family at bedside   Consultants:  Neurosurgery  Procedures:      Objective   Vitals:   12/11/20 1100 12/11/20 1130  12/11/20 1200 12/11/20 1230  BP: (!) 148/81 (!) 142/82 138/88 (!) 150/87  Pulse: 65 65 64 65  Resp: (!) 23 (!) 26 20 16   Temp:      TempSrc:      SpO2: 95% 91% 92% 92%  Weight:      Height:        Intake/Output Summary (Last 24 hours) at 12/11/2020 1429 Last data filed at 12/10/2020 1824 Gross per 24 hour  Intake --  Output 220 ml  Net -220 ml    01/31 1901 - 02/02 0700 In: -  Out: 570 [Urine:570]  Filed Weights   12/10/20 0655  Weight: 88 kg    Physical Examination:    General-appears in no acute distress  Heart-S1-S2, regular, no murmur auscultated  Lungs-clear to auscultation bilaterally, no wheezing or crackles auscultated  Abdomen-soft, nontender, no organomegaly  Extremities-no edema in the lower extremities  Neuro-alert, oriented x3, no focal deficit noted   Status is: Inpatient  Dispo: The patient is from: Home              Anticipated d/c is to: Home              Anticipated d/c date is: 12/13/2020              Patient currently not stable for discharge  Barrier to discharge-observation for SDH, SAH.  Also work-up for dizziness.        Data Reviewed:   Recent Results (from the past 240 hour(s))  SARS CORONAVIRUS 2 (TAT 6-24 HRS) Nasopharyngeal     Status: Abnormal   Collection Time: 12/10/20 10:52 AM   Specimen: Nasopharyngeal  Result Value Ref Range Status   SARS Coronavirus 2 POSITIVE (A) NEGATIVE Final    Comment: (NOTE) SARS-CoV-2 target nucleic acids are DETECTED.  The SARS-CoV-2 RNA is generally detectable in upper and lower respiratory specimens during the acute phase of infection. Positive results are indicative of the presence of SARS-CoV-2 RNA. Clinical correlation with patient history and other diagnostic information is  necessary to determine patient infection status. Positive results do not rule out bacterial infection or co-infection with other viruses.  The expected result is Negative.  Fact Sheet for  Patients: HairSlick.no  Fact Sheet for Healthcare Providers: quierodirigir.com  This test is not yet approved or cleared by the [place] FDA and  has been authorized for detection and/or diagnosis of SARS-CoV-2 by FDA under an Emergency Use Authorization (EUA). This EUA will remain  in effect (meaning this test can be used) for the duration of the COVID-19 declaration under Section 564(b)(1) of the Act, 21 U. S.C. section 360bbb-3(b)(1), unless the authorization is terminated or revoked sooner.   Performed at [facility], [address], [place] , [place] [address]     No results for input(s): LIPASE, AMYLASE in the last 168 hours. No results for input(s): AMMONIA in the last 168 hours.  Cardiac Enzymes: No results for input(s): CKTOTAL, CKMB, CKMBINDEX, TROPONINI in the last 168  hours. BNP (last 3 results) No results for input(s): BNP in the last 8760 hours.  ProBNP (last 3 results) No results for input(s): PROBNP in the last 8760 hours.  Studies:  CT HEAD WO CONTRAST  Result Date: 12/11/2020 CLINICAL DATA:  Subdural hematoma follow-up EXAM: CT HEAD WITHOUT CONTRAST TECHNIQUE: Contiguous axial images were obtained from the base of the skull through the vertex without intravenous contrast. COMPARISON:  MR head 12/10/2020, FINDINGS: Brain: Redemonstration of prior left frontal infarction/encephalomalacia. No evidence of large-territorial acute infarction. No parenchymal hemorrhage. Stable right parietooccipital hyperdensity within calcification consistent with cavernous malformation. Stable thin of fall seen subdural hematoma extending along the right tentorium measuring up to 4 mm. Hyperdensity along the right occipital sulci consistent with known trace subarachnoid hemorrhage that is better evaluated on MR head 12/10/2020 (2:20). No mass effect or midline shift. No hydrocephalus. Basilar cisterns are patent. Vascular:  No hyperdense vessel. Skull: No acute fracture or focal lesion. Sinuses/Orbits: Paranasal sinuses and mastoid air cells are clear. The orbits are unremarkable. Other: None. IMPRESSION: 1. Stable 4 mm right parafalcine/tentorial subdural hematoma. 2. Stable trace right occipital subarachnoid hemorrhage. 3. No new intracranial abnormality. Electronically Signed   By: Tish FredericksonMorgane  Naveau M.D.   On: 12/11/2020 06:24   CT Head Wo Contrast  Result Date: 12/10/2020 CLINICAL DATA:  Follow-up subdural hematoma. EXAM: CT HEAD WITHOUT CONTRAST TECHNIQUE: Contiguous axial images were obtained from the base of the skull through the vertex without intravenous contrast. COMPARISON:  Head CT earlier today at 1008 hour FINDINGS: Brain: Acute subdural hematoma along the falx and right tentorium, not significantly changed from earlier today. This measures up to 4 mm in thickness along the right tentorium, measured on series 4, image 46. Adjacent right occipital subarachnoid hemorrhage with slight increase from earlier today. No new hemorrhage. High density nodule in the right frontoparietal white matter measuring 1 cm with internal calcification, unchanged. No surrounding edema. Remote left MCA infarct. No evidence of acute ischemia. No hydrocephalus. No midline shift. Vascular: Atherosclerosis of skullbase vasculature without hyperdense vessel or abnormal calcification. Skull: No fracture or focal lesion. Sinuses/Orbits: Paranasal sinuses and mastoid air cells are clear. The visualized orbits are unremarkable. Other: None. IMPRESSION: 1. Stable appearance of acute subdural hematoma along the falx and right tentorium, not significantly changed from earlier today. This measures up to 4 mm in thickness along the right tentorium. 2. Adjacent right occipital subarachnoid hemorrhage with slight increase from earlier today. 3. Otherwise stable exam.  Remote left MCA infarct. Electronically Signed   By: Narda RutherfordMelanie  Sanford M.D.   On: 12/10/2020  17:19   CT Head Wo Contrast  Result Date: 12/10/2020 CLINICAL DATA:  Dizziness with fall. EXAM: CT HEAD WITHOUT CONTRAST CT CERVICAL SPINE WITHOUT CONTRAST TECHNIQUE: Multidetector CT imaging of the head and cervical spine was performed following the standard protocol without intravenous contrast. Multiplanar CT image reconstructions of the cervical spine were also generated. COMPARISON:  10/13/2020 FINDINGS: CT HEAD FINDINGS Brain: Large remote left MCA branch infarct affecting the high and lateral left frontal lobe. High-density nodule in the right frontal parietal white matter measuring 1 cm and showing internal coarse calcification. No perilesional edema or interval change. High-density subdural hematoma along the falx and right tentorium which measures up to 4 mm in thickness. Trace subarachnoid hemorrhage along the inferior right occipital lobe. No evidence of acute infarct.  No hydrocephalus or masslike finding Vascular: Atheromatous calcification Skull: Negative Sinuses/Orbits: Negative Critical Value/emergent results were called by telephone at the time  of interpretation on 12/10/2020 at 10:28 am to provider Jene Every , who verbally acknowledged these results. CT CERVICAL SPINE FINDINGS Alignment: No traumatic malalignment Skull base and vertebrae: No acute fracture Soft tissues and spinal canal: No prevertebral fluid or swelling. No visible canal hematoma. Disc levels: Generalized disc space narrowing and endplate ridging. 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 Multilevel foraminal narrowing Upper chest: Emphysematous spaces. IMPRESSION: 1. Acute subdural hematoma along the falx and right tentorium, up to 4 mm in thickness. Trace adjacent subarachnoid hemorrhage. 2. Remote left MCA branch infarct. 3. Stable 1 cm nodule in the right cerebral white matter again favoring cavernoma or other benign process. 4. Negative for cervical spine fracture. Electronically Signed   By:  Marnee Spring M.D.   On: 12/10/2020 10:33   CT CERVICAL SPINE WO CONTRAST  Result Date: 12/10/2020 CLINICAL DATA:  Dizziness with fall. EXAM: CT HEAD WITHOUT CONTRAST CT CERVICAL SPINE WITHOUT CONTRAST TECHNIQUE: Multidetector CT imaging of the head and cervical spine was performed following the standard protocol without intravenous contrast. Multiplanar CT image reconstructions of the cervical spine were also generated. COMPARISON:  10/13/2020 FINDINGS: CT HEAD FINDINGS Brain: Large remote left MCA branch infarct affecting the high and lateral left frontal lobe. High-density nodule in the right frontal parietal white matter measuring 1 cm and showing internal coarse calcification. No perilesional edema or interval change. High-density subdural hematoma along the falx and right tentorium which measures up to 4 mm in thickness. Trace subarachnoid hemorrhage along the inferior right occipital lobe. No evidence of acute infarct.  No hydrocephalus or masslike finding Vascular: Atheromatous calcification Skull: Negative Sinuses/Orbits: Negative Critical Value/emergent results were called by telephone at the time of interpretation on 12/10/2020 at 10:28 am to provider Jene Every , who verbally acknowledged these results. CT CERVICAL SPINE FINDINGS Alignment: No traumatic malalignment Skull base and vertebrae: No acute fracture Soft tissues and spinal canal: No prevertebral fluid or swelling. No visible canal hematoma. Disc levels: Generalized disc space narrowing and endplate ridging. 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 Multilevel foraminal narrowing Upper chest: Emphysematous spaces. IMPRESSION: 1. Acute subdural hematoma along the falx and right tentorium, up to 4 mm in thickness. Trace adjacent subarachnoid hemorrhage. 2. Remote left MCA branch infarct. 3. Stable 1 cm nodule in the right cerebral white matter again favoring cavernoma or other benign process. 4. Negative for cervical  spine fracture. Electronically Signed   By: Marnee Spring M.D.   On: 12/10/2020 10:33   MR BRAIN WO CONTRAST  Result Date: 12/10/2020 CLINICAL DATA:  Dizziness, subdural hematoma EXAM: MRI HEAD WITHOUT CONTRAST TECHNIQUE: Multiplanar, multiecho pulse sequences of the brain and surrounding structures were obtained without intravenous contrast. COMPARISON:  Question made with prior CT imaging FINDINGS: Brain: There is no acute infarction. Subdural hemorrhage is present along the dependent posterior right cerebral convexity, posterior falx, and right tentorium. There is right occipital sulcal T2 FLAIR hyperintensity reflecting subarachnoid hemorrhage. Chronic left frontal infarct with encephalomalacia and gliosis and chronic blood products. Heterogeneous 8 mm lesion in the parasagittal right parietal lobe with corresponding susceptibility is most consistent with a cavernous malformation. Additional susceptibility hypointensity in peripheral right temporal lobe likely reflects chronic blood products. Additional patchy T2 hyperintensity in the supratentorial white matter is nonspecific but probably reflects chronic microvascular ischemic changes. Vascular: Diminished right sigmoid sinus flow void probably reflects slow flow. No corresponding abnormal T1 hyperintensity. Major arterial flow voids at the skull base are preserved. Skull and upper cervical spine: Normal  marrow signal is preserved. Sinuses/Orbits: Paranasal sinuses are aerated. Orbits are unremarkable. Other: Sella is unremarkable.  Mastoid air cells are clear. IMPRESSION: Small volume subdural hemorrhage along the dependent posterior right cerebral convexity, posterior falx, and right tentorium. Adjacent right occipital subarachnoid hemorrhage. Diminished right sigmoid sinus flow void probably reflects slow flow. CTV/MRV could be considered for confirmation. 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 Electronically Signed   By: Guadlupe Spanish M.D.   On: 12/10/2020 21:14   DG Chest Port 1 View  Result Date: 12/10/2020 CLINICAL DATA:  Weakness. EXAM: PORTABLE CHEST 1 VIEW COMPARISON:  Chest/rib radiographs and chest CT 10/13/2020 FINDINGS: The cardiac silhouette is within normal limits for portable AP technique. A moderate-sized sliding hiatal hernia and thoracic aortic atherosclerosis are noted. There is mild chronic coarsening of the interstitial markings. No acute airspace consolidation, edema, pleural effusion, or pneumothorax is identified. No acute osseous abnormality is seen. IMPRESSION: No active disease. Electronically Signed   By: Sebastian Ache M.D.   On: 12/10/2020 08:53       Chantale Leugers S Loza Prell   [facility] If 7PM-7AM, please contact night-coverage at [email or web address], Office  [phone]   12/11/2020, 2:29 PM  LOS: 0 days

## 2020-12-11 NOTE — ED Notes (Signed)
Provided urinal and emptied once finished 200 CC yellow urine noted in urinal

## 2020-12-11 NOTE — Consult Note (Signed)
Remdesivir - Pharmacy Brief Note   O:  ALT: 22 CXR: "No active disease" SpO2: 90 - 95% on room air   A/P:  12/10/20 SARS-CoV-2 PCR (+)  Remdesivir 200 mg IVPB once followed by 100 mg IVPB daily x 2 days.   Per chart review: Patient admitted with traumatic SDH / SAH. Incidental finding of positive SARS-CoV-2 test. Patient is asymptomatic, denies shortness of breath, not requiring supplemental oxygen. Will elect for 3d course of remdesivir per health system guidance. Duration of therapy can be extended to 5d if clinically indicated at provider's discretion.   Tressie Ellis 12/11/2020 3:50 PM

## 2020-12-12 ENCOUNTER — Encounter: Payer: Self-pay | Admitting: Family Medicine

## 2020-12-12 DIAGNOSIS — K449 Diaphragmatic hernia without obstruction or gangrene: Secondary | ICD-10-CM | POA: Diagnosis present

## 2020-12-12 DIAGNOSIS — Z820 Family history of epilepsy and other diseases of the nervous system: Secondary | ICD-10-CM | POA: Diagnosis not present

## 2020-12-12 DIAGNOSIS — I4891 Unspecified atrial fibrillation: Secondary | ICD-10-CM | POA: Diagnosis not present

## 2020-12-12 DIAGNOSIS — S066X0A Traumatic subarachnoid hemorrhage without loss of consciousness, initial encounter: Secondary | ICD-10-CM | POA: Diagnosis not present

## 2020-12-12 DIAGNOSIS — Z8673 Personal history of transient ischemic attack (TIA), and cerebral infarction without residual deficits: Secondary | ICD-10-CM | POA: Diagnosis not present

## 2020-12-12 DIAGNOSIS — S065X9A Traumatic subdural hemorrhage with loss of consciousness of unspecified duration, initial encounter: Secondary | ICD-10-CM | POA: Diagnosis not present

## 2020-12-12 DIAGNOSIS — I493 Ventricular premature depolarization: Secondary | ICD-10-CM | POA: Diagnosis not present

## 2020-12-12 DIAGNOSIS — E785 Hyperlipidemia, unspecified: Secondary | ICD-10-CM | POA: Diagnosis not present

## 2020-12-12 DIAGNOSIS — K219 Gastro-esophageal reflux disease without esophagitis: Secondary | ICD-10-CM | POA: Diagnosis not present

## 2020-12-12 DIAGNOSIS — S065X0A Traumatic subdural hemorrhage without loss of consciousness, initial encounter: Secondary | ICD-10-CM | POA: Diagnosis not present

## 2020-12-12 DIAGNOSIS — Z7901 Long term (current) use of anticoagulants: Secondary | ICD-10-CM | POA: Diagnosis not present

## 2020-12-12 DIAGNOSIS — I639 Cerebral infarction, unspecified: Secondary | ICD-10-CM | POA: Diagnosis not present

## 2020-12-12 DIAGNOSIS — R55 Syncope and collapse: Secondary | ICD-10-CM | POA: Diagnosis not present

## 2020-12-12 DIAGNOSIS — I609 Nontraumatic subarachnoid hemorrhage, unspecified: Secondary | ICD-10-CM | POA: Diagnosis not present

## 2020-12-12 DIAGNOSIS — I480 Paroxysmal atrial fibrillation: Secondary | ICD-10-CM | POA: Diagnosis not present

## 2020-12-12 DIAGNOSIS — W19XXXA Unspecified fall, initial encounter: Secondary | ICD-10-CM | POA: Diagnosis not present

## 2020-12-12 DIAGNOSIS — Z801 Family history of malignant neoplasm of trachea, bronchus and lung: Secondary | ICD-10-CM | POA: Diagnosis not present

## 2020-12-12 DIAGNOSIS — I700 Atherosclerosis of aorta: Secondary | ICD-10-CM | POA: Diagnosis present

## 2020-12-12 DIAGNOSIS — Z87891 Personal history of nicotine dependence: Secondary | ICD-10-CM | POA: Diagnosis not present

## 2020-12-12 DIAGNOSIS — I10 Essential (primary) hypertension: Secondary | ICD-10-CM | POA: Diagnosis not present

## 2020-12-12 DIAGNOSIS — R42 Dizziness and giddiness: Secondary | ICD-10-CM | POA: Diagnosis not present

## 2020-12-12 DIAGNOSIS — U071 COVID-19: Secondary | ICD-10-CM | POA: Diagnosis not present

## 2020-12-12 DIAGNOSIS — J439 Emphysema, unspecified: Secondary | ICD-10-CM | POA: Diagnosis not present

## 2020-12-12 DIAGNOSIS — Z79899 Other long term (current) drug therapy: Secondary | ICD-10-CM | POA: Diagnosis not present

## 2020-12-12 LAB — URINE DRUG SCREEN, QUALITATIVE (ARMC ONLY)
Amphetamines, Ur Screen: NOT DETECTED
Barbiturates, Ur Screen: NOT DETECTED
Benzodiazepine, Ur Scrn: NOT DETECTED
Cannabinoid 50 Ng, Ur ~~LOC~~: NOT DETECTED
Cocaine Metabolite,Ur ~~LOC~~: NOT DETECTED
MDMA (Ecstasy)Ur Screen: NOT DETECTED
Methadone Scn, Ur: NOT DETECTED
Opiate, Ur Screen: NOT DETECTED
Phencyclidine (PCP) Ur S: NOT DETECTED
Tricyclic, Ur Screen: NOT DETECTED

## 2020-12-12 LAB — PROTIME-INR
INR: 1.2 (ref 0.8–1.2)
Prothrombin Time: 14.9 seconds (ref 11.4–15.2)

## 2020-12-12 LAB — APTT: aPTT: 35 seconds (ref 24–36)

## 2020-12-12 LAB — MRSA PCR SCREENING: MRSA by PCR: NEGATIVE

## 2020-12-12 MED ORDER — LISINOPRIL 10 MG PO TABS
10.0000 mg | ORAL_TABLET | Freq: Every day | ORAL | Status: DC
  Administered 2020-12-12 – 2020-12-15 (×4): 10 mg via ORAL
  Filled 2020-12-12 (×4): qty 1

## 2020-12-12 MED ORDER — AMLODIPINE BESYLATE 5 MG PO TABS
5.0000 mg | ORAL_TABLET | Freq: Every day | ORAL | Status: DC
  Administered 2020-12-12 – 2020-12-15 (×4): 5 mg via ORAL
  Filled 2020-12-12 (×4): qty 1

## 2020-12-12 MED ORDER — ATORVASTATIN CALCIUM 20 MG PO TABS
10.0000 mg | ORAL_TABLET | Freq: Every day | ORAL | Status: DC
  Administered 2020-12-12 – 2020-12-14 (×3): 10 mg via ORAL
  Filled 2020-12-12 (×4): qty 1

## 2020-12-12 MED ORDER — CARVEDILOL 25 MG PO TABS
25.0000 mg | ORAL_TABLET | Freq: Two times a day (BID) | ORAL | Status: DC
Start: 2020-12-12 — End: 2020-12-15
  Administered 2020-12-12 – 2020-12-15 (×7): 25 mg via ORAL
  Filled 2020-12-12 (×3): qty 1
  Filled 2020-12-12: qty 4
  Filled 2020-12-12 (×3): qty 1

## 2020-12-12 MED ORDER — FLECAINIDE ACETATE 100 MG PO TABS
100.0000 mg | ORAL_TABLET | Freq: Two times a day (BID) | ORAL | Status: DC
  Administered 2020-12-12 – 2020-12-15 (×7): 100 mg via ORAL
  Filled 2020-12-12 (×9): qty 1

## 2020-12-12 MED ORDER — SENNOSIDES-DOCUSATE SODIUM 8.6-50 MG PO TABS: 1.0000 | ORAL_TABLET | Freq: Every evening | ORAL | Status: DC | PRN

## 2020-12-12 MED ORDER — ACETAMINOPHEN 325 MG PO TABS
650.0000 mg | ORAL_TABLET | ORAL | Status: DC | PRN
  Administered 2020-12-14: 21:00:00 650 mg via ORAL

## 2020-12-12 MED ORDER — ACETAMINOPHEN 650 MG RE SUPP: 650.0000 mg | RECTAL | Status: DC | PRN

## 2020-12-12 MED ORDER — ACETAMINOPHEN 160 MG/5ML PO SOLN
650.0000 mg | ORAL | Status: DC | PRN
  Filled 2020-12-12: qty 20.3

## 2020-12-12 NOTE — Progress Notes (Signed)
Patient just moved to 1C. Patient under observation status. Attempted call into patient's room x 3 for MOON Letter. Unable to reach patient. Asked RN to have patient call CSW when able.  Alfonso Ramus, [place] [phone]

## 2020-12-12 NOTE — Progress Notes (Signed)
[facility]  PROGRESS NOTE  Peter Castro BJY:[id number] DOB: 10-20-1946 DOA: 12/10/2020 PCP: McLean-Scocuzza, Pasty Spillers, MD   Brief HPI:   75 year old male with history of hypertension, hyperlipidemia, stroke, GERD, atrial fibrillation on Eliquis presented with dizziness and fall.  Patient said that he has been having dizziness for past 2 days denied visual loss or hearing loss.  No numbness or tingling in extremities.  No facial droop or slurred speech.  He came to ED yesterday but left ED before being seen.  He then went to [facility] and fell.  He denied head or neck injury.  He continued to have dizziness.  In the ED, lab work showed positive COVID-19 PCR.  CT head showed SDH and SAH.  Neurosurgery Dr. Adriana Simas was consulted and he recommended to hold Eliquis for 1 week.  Subjective   Patient seen and examined, denies headache or dizziness.  Neurosurgery has seen the patient and recommend to hold Eliquis for 1 week.  No further intervention recommended.   Assessment/Plan:     1. SDH/SAH-patient complains of headache.  Dr. Adriana Simas from neurosurgery was consulted, he recommended holding Eliquis for 1 week.  No need to give Kcentra at this time.  CT head was repeated per Dr. Adriana Simas which was done around 5 PM yesterday.  CT head showed stable appearance of acute SDH along the falx right tentorium, not significantly changed from previous CT.  This measures up to 4 mm in thickness along the right tentorium.  Adjacent right occipital subdural hemorrhage with slight increase from earlier.  Continue frequent neurochecks, Eliquis on hold.  Repeat CT head this morning again showed stable for millimeter right parafalcine/tentorial subdural hematoma.  Stable trace right occipital subarachnoid hemorrhage.  Will hold Eliquis for 1 week, patient can follow-up with neurosurgery as outpatient. 2. Dizziness-MRI brain showed small volume subdural hemorrhage along the dependent posterior right cerebral convexity,  posterior falx and right tentorium.  Adjacent right occipital subarachnoid hemorrhage.  Diminished right sigmoid sinus flow void probably reflects slow flow.  Recommend to obtain MRV for confirmation.  Will obtain MRV with and without contrast.  Will obtain orthostatic vital signs every shift.Continue monitoring on telemetry.  Continue as needed meclizine.  Patient takes flecainide and Coreg at home.  Will consult cardiology to adjust medications. 3. Positive COVID-19 PCR-COVID-19 PCR was positive, patient is asymptomatic, not requiring oxygen.  Does not have other symptoms of COVID-19 infection, no nausea vomiting or diarrhea.  No loss of taste or smell.  We will continue to monitor for  symptoms.  Patient started on remdesivir for 3 days.   He does have history of stroke, hypertension, hyperlipidemia and his age puts him at risk for serious complications from COVID-19 infection. 4. Atrial fibrillation-Eliquis on hold as per neurosurgery for 1 week.  Continue Coreg, flecainide.  Monitor on telemetry. 5. Hypertension-continue multipin, Coreg, lisinopril.  Continue IV hydralazine as needed for SBP more than 140. 6. Hyperlipidemia-continue Lipitor.     COVID-19 Labs  No results for input(s): DDIMER, FERRITIN, LDH, CRP in the last 72 hours.  Lab Results  Component Value Date   SARSCOV2NAA POSITIVE (A) 12/10/2020     Scheduled medications:   . amLODipine  5 mg Oral Daily  . atorvastatin  10 mg Oral q1800  . carvedilol  25 mg Oral BID WC  . flecainide  100 mg Oral BID  . lisinopril  10 mg Oral Daily         CBG: No results for input(s): GLUCAP in  the last 168 hours.  SpO2: 97 %    CBC: Recent Labs  Lab 12/09/20 1000 12/10/20 0658  WBC 11.8* 9.5  NEUTROABS  --  7.1  HGB 15.8 15.8  HCT 47.2 47.5  MCV 89.9 90.3  PLT 159 146*    Basic Metabolic Panel: Recent Labs  Lab 12/09/20 1000 12/10/20 0658  NA 137 137  K 4.4 4.0  CL 101 102  CO2 27 25  GLUCOSE 117* 112*  BUN  19 19  CREATININE 1.18 1.06  CALCIUM 8.8* 8.8*     Liver Function Tests: Recent Labs  Lab 12/10/20 0658  AST 25  ALT 22  ALKPHOS 79  BILITOT 1.2  PROT 6.8  ALBUMIN 4.1     Antibiotics: Anti-infectives (From admission, onward)   Start     Dose/Rate Route Frequency Ordered Stop   12/12/20 1000  remdesivir 100 mg in sodium chloride 0.9 % 100 mL IVPB       "Followed by" Linked Group Details   100 mg 200 mL/hr over 30 Minutes Intravenous Daily 12/11/20 1548 12/14/20 0959   12/11/20 1700  remdesivir 200 mg in sodium chloride 0.9% 250 mL IVPB       "Followed by" Linked Group Details   200 mg 580 mL/hr over 30 Minutes Intravenous Once 12/11/20 1548 12/11/20 2228       DVT prophylaxis: SCDs  Code Status: Full code  Family Communication: No family at bedside   Consultants:  Neurosurgery  Procedures:      Objective   Vitals:   12/12/20 1030 12/12/20 1100 12/12/20 1130 12/12/20 1233  BP: 127/76 (!) 141/70 130/68 139/71  Pulse: 68 66 67 64  Resp:  17 16 18   Temp:    98.1 F (36.7 C)  TempSrc:    Oral  SpO2: 95% 94% 94% 97%  Weight:      Height:        Intake/Output Summary (Last 24 hours) at 12/12/2020 1350 Last data filed at 12/12/2020 0008 Gross per 24 hour  Intake 2189.1 ml  Output --  Net 2189.1 ml    02/01 1901 - 02/03 0700 In: 2189.1 [I.V.:1939.1] Out: -   Filed Weights   12/10/20 0655  Weight: 88 kg    Physical Examination:    General-appears in no acute distress  Heart-S1-S2, regular, no murmur auscultated  Lungs-clear to auscultation bilaterally, no wheezing or crackles auscultated  Abdomen-soft, nontender, no organomegaly  Extremities-no edema in the lower extremities  Neuro-alert, oriented x3, no focal deficit noted   Status is: Inpatient  Dispo: The patient is from: Home              Anticipated d/c is to: Home              Anticipated d/c date is: 12/13/2020              Patient currently not stable for  discharge  Barrier to discharge-observation for SDH, SAH.  Also work-up for dizziness.        Data Reviewed:   Recent Results (from the past 240 hour(s))  SARS CORONAVIRUS 2 (TAT 6-24 HRS) Nasopharyngeal     Status: Abnormal   Collection Time: 12/10/20 10:52 AM   Specimen: Nasopharyngeal  Result Value Ref Range Status   SARS Coronavirus 2 POSITIVE (A) NEGATIVE Final    Comment: (NOTE) SARS-CoV-2 target nucleic acids are DETECTED.  The SARS-CoV-2 RNA is generally detectable in upper and lower respiratory specimens during the acute phase of  infection. Positive results are indicative of the presence of SARS-CoV-2 RNA. Clinical correlation with patient history and other diagnostic information is  necessary to determine patient infection status. Positive results do not rule out bacterial infection or co-infection with other viruses.  The expected result is Negative.  Fact Sheet for Patients: HairSlick.no  Fact Sheet for Healthcare Providers: quierodirigir.com  This test is not yet approved or cleared by the [place] FDA and  has been authorized for detection and/or diagnosis of SARS-CoV-2 by FDA under an Emergency Use Authorization (EUA). This EUA will remain  in effect (meaning this test can be used) for the duration of the COVID-19 declaration under Section 564(b)(1) of the Act, 21 U. S.C. section 360bbb-3(b)(1), unless the authorization is terminated or revoked sooner.   Performed at [facility], [address], [place], [place] [address]     No results for input(s): LIPASE, AMYLASE in the last 168 hours. No results for input(s): AMMONIA in the last 168 hours.  Cardiac Enzymes: No results for input(s): CKTOTAL, CKMB, CKMBINDEX, TROPONINI in the last 168 hours. BNP (last 3 results) No results for input(s): BNP in the last 8760 hours.  ProBNP (last 3 results) No results for input(s): PROBNP in the  last 8760 hours.  Studies:  CT HEAD WO CONTRAST  Result Date: 12/11/2020 CLINICAL DATA:  Subdural hematoma follow-up EXAM: CT HEAD WITHOUT CONTRAST TECHNIQUE: Contiguous axial images were obtained from the base of the skull through the vertex without intravenous contrast. COMPARISON:  MR head 12/10/2020, FINDINGS: Brain: Redemonstration of prior left frontal infarction/encephalomalacia. No evidence of large-territorial acute infarction. No parenchymal hemorrhage. Stable right parietooccipital hyperdensity within calcification consistent with cavernous malformation. Stable thin of fall seen subdural hematoma extending along the right tentorium measuring up to 4 mm. Hyperdensity along the right occipital sulci consistent with known trace subarachnoid hemorrhage that is better evaluated on MR head 12/10/2020 (2:20). No mass effect or midline shift. No hydrocephalus. Basilar cisterns are patent. Vascular: No hyperdense vessel. Skull: No acute fracture or focal lesion. Sinuses/Orbits: Paranasal sinuses and mastoid air cells are clear. The orbits are unremarkable. Other: None. IMPRESSION: 1. Stable 4 mm right parafalcine/tentorial subdural hematoma. 2. Stable trace right occipital subarachnoid hemorrhage. 3. No new intracranial abnormality. Electronically Signed   By: Tish Frederickson M.D.   On: 12/11/2020 06:24   CT Head Wo Contrast  Result Date: 12/10/2020 CLINICAL DATA:  Follow-up subdural hematoma. EXAM: CT HEAD WITHOUT CONTRAST TECHNIQUE: Contiguous axial images were obtained from the base of the skull through the vertex without intravenous contrast. COMPARISON:  Head CT earlier today at 1008 hour FINDINGS: Brain: Acute subdural hematoma along the falx and right tentorium, not significantly changed from earlier today. This measures up to 4 mm in thickness along the right tentorium, measured on series 4, image 46. Adjacent right occipital subarachnoid hemorrhage with slight increase from earlier today. No new  hemorrhage. High density nodule in the right frontoparietal white matter measuring 1 cm with internal calcification, unchanged. No surrounding edema. Remote left MCA infarct. No evidence of acute ischemia. No hydrocephalus. No midline shift. Vascular: Atherosclerosis of skullbase vasculature without hyperdense vessel or abnormal calcification. Skull: No fracture or focal lesion. Sinuses/Orbits: Paranasal sinuses and mastoid air cells are clear. The visualized orbits are unremarkable. Other: None. IMPRESSION: 1. Stable appearance of acute subdural hematoma along the falx and right tentorium, not significantly changed from earlier today. This measures up to 4 mm in thickness along the right tentorium. 2. Adjacent right  occipital subarachnoid hemorrhage with slight increase from earlier today. 3. Otherwise stable exam.  Remote left MCA infarct. Electronically Signed   By: Narda Rutherford M.D.   On: 12/10/2020 17:19   MR BRAIN WO CONTRAST  Result Date: 12/10/2020 CLINICAL DATA:  Dizziness, subdural hematoma EXAM: MRI HEAD WITHOUT CONTRAST TECHNIQUE: Multiplanar, multiecho pulse sequences of the brain and surrounding structures were obtained without intravenous contrast. COMPARISON:  Question made with prior CT imaging FINDINGS: Brain: There is no acute infarction. Subdural hemorrhage is present along the dependent posterior right cerebral convexity, posterior falx, and right tentorium. There is right occipital sulcal T2 FLAIR hyperintensity reflecting subarachnoid hemorrhage. Chronic left frontal infarct with encephalomalacia and gliosis and chronic blood products. Heterogeneous 8 mm lesion in the parasagittal right parietal lobe with corresponding susceptibility is most consistent with a cavernous malformation. Additional susceptibility hypointensity in peripheral right temporal lobe likely reflects chronic blood products. Additional patchy T2 hyperintensity in the supratentorial white matter is nonspecific but  probably reflects chronic microvascular ischemic changes. Vascular: Diminished right sigmoid sinus flow void probably reflects slow flow. No corresponding abnormal T1 hyperintensity. Major arterial flow voids at the skull base are preserved. Skull and upper cervical spine: Normal marrow signal is preserved. Sinuses/Orbits: Paranasal sinuses are aerated. Orbits are unremarkable. Other: Sella is unremarkable.  Mastoid air cells are clear. IMPRESSION: Small volume subdural hemorrhage along the dependent posterior right cerebral convexity, posterior falx, and right tentorium. Adjacent right occipital subarachnoid hemorrhage. Diminished right sigmoid sinus flow void probably reflects slow flow. CTV/MRV could be considered for confirmation. 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 Chronic microvascular ischemic changes. Electronically Signed   By: Guadlupe Spanish M.D.   On: 12/10/2020 21:14   MR MRV HEAD W WO CONTRAST  Result Date: 12/11/2020 CLINICAL DATA:  Possible abnormal sigmoid sinus on MRI brain EXAM: MR VENOGRAM HEAD WITHOUT AND WITH CONTRAST TECHNIQUE: Angiographic images of the intracranial venous structures were obtained using MRV technique without and with intravenous contrast. CONTRAST:  7.49mL GADAVIST GADOBUTROL 1 MMOL/ML IV SOLN COMPARISON:  Correlation made with prior MRI brain FINDINGS: Superior sagittal sinus, straight sinus, vein of Galen, internal cerebral veins are patent. Transverse sinuses are patent, dominant on the left. Right larger than left filling defects at the distal transverse sinuses likely reflect arachnoid granulations. Bilateral sigmoid sinuses are patent. IMPRESSION: No evidence of dural sinus thrombosis. Electronically Signed   By: Guadlupe Spanish M.D.   On: 12/11/2020 15:57       Curtina Grills S Roshawn Ayala   [facility] If 7PM-7AM, please contact night-coverage at [email or web address], Office  [phone]   12/12/2020, 1:50 PM  LOS: 0 days

## 2020-12-12 NOTE — ED Notes (Signed)
OT at bedside. 

## 2020-12-12 NOTE — Consult Note (Signed)
Referring Physician:  No referring provider defined for this encounter.  Primary Physician:  McLean-Scocuzza, Pasty Spillers, MD  Chief Complaint:  Small subdural hematoma History of Present Illness: 12/12/2020 Peter Castro is a 75 y.o. male who presents with the chief complaint of dizziness and fall.  He was evaluated in the ER and found to have a small subdural hematoma.  He has been having dizziness and lightheadedness recently.  He has had at least one fall.    He currently denies any changes in cognition, headache, nausea, or vomiting.   Review of Systems:  A 10 point review of systems is negative, except for the pertinent positives and negatives detailed in the HPI.  Past Medical History: Past Medical History:  Diagnosis Date  . A-fib (HCC)   . Arthritis   . GERD (gastroesophageal reflux disease)   . History of chicken pox   . Hyperlipidemia   . Hypertension   . Loss of memory   . Stroke [facility])    ~2013/14    Past Surgical History: Past Surgical History:  Procedure Laterality Date  . APPENDECTOMY    . OTHER SURGICAL HISTORY     cryoballon pvi isthmus tricuspid ablation Dr. Kathrynn Ducking 01/2014 [place]     Allergies: Allergies as of 12/10/2020  . (No Known Allergies)    Medications:  Current Facility-Administered Medications:  .  0.9 %  sodium chloride infusion, , Intravenous, Continuous, Lorretta Harp, MD, Last Rate: 10 mL/hr at 12/12/20 0810, Rate Verify at 12/12/20 0810 .  acetaminophen (TYLENOL) tablet 650 mg, 650 mg, Oral, Q4H PRN **OR** acetaminophen (TYLENOL) 160 MG/5ML solution 650 mg, 650 mg, Per Tube, Q4H PRN **OR** acetaminophen (TYLENOL) suppository 650 mg, 650 mg, Rectal, Q4H PRN, Lorretta Harp, MD .  amLODipine (NORVASC) tablet 5 mg, 5 mg, Oral, Daily, Lorretta Harp, MD .  atorvastatin (LIPITOR) tablet 10 mg, 10 mg, Oral, q1800, Lorretta Harp, MD .  carvedilol (COREG) tablet 25 mg, 25 mg, Oral, BID WC, Lorretta Harp, MD, 25 mg at 12/12/20 0834 .  flecainide  (TAMBOCOR) tablet 100 mg, 100 mg, Oral, BID, Lorretta Harp, MD .  hydrALAZINE (APRESOLINE) injection 5 mg, 5 mg, Intravenous, Q2H PRN, Lorretta Harp, MD, 5 mg at 12/11/20 0650 .  lisinopril (ZESTRIL) tablet 10 mg, 10 mg, Oral, Daily, Lorretta Harp, MD .  meclizine (ANTIVERT) tablet 25 mg, 25 mg, Oral, TID PRN, Lorretta Harp, MD .  ondansetron [facility]) injection 4 mg, 4 mg, Intravenous, Q8H PRN, Lorretta Harp, MD .  [address] remdesivir 200 mg in sodium chloride 0.9% 250 mL IVPB, 200 mg, Intravenous, Once, Stopped at 12/11/20 2228 **FOLLOWED BY** remdesivir 100 mg in sodium chloride 0.9 % 100 mL IVPB, 100 mg, Intravenous, Daily, Tressie Ellis, RPH .  senna-docusate (Senokot-S) tablet 1 tablet, 1 tablet, Oral, QHS PRN, Lorretta Harp, MD  Current Outpatient Medications:  .  amLODipine (NORVASC) 5 MG tablet, Take 1 tablet (5 mg total) by mouth daily as needed., Disp: 90 tablet, Rfl: 3 .  atorvastatin (LIPITOR) 10 MG tablet, Take 1 tablet (10 mg total) by mouth daily at 6 PM., Disp: 90 tablet, Rfl: 3 .  carvedilol (COREG) 25 MG tablet, Take 1 tablet (25 mg total) by mouth 2 (two) times daily with a meal., Disp: 180 tablet, Rfl: 3 .  ELIQUIS 5 MG TABS tablet, TAKE ONE TABLET BY MOUTH TWICE A DAY, Disp: 180 tablet, Rfl: 1 .  flecainide (TAMBOCOR) 100 MG tablet, TAKE ONE TABLET BY MOUTH TWICE A DAY, Disp: 180  tablet, Rfl: 2 .  lisinopril (ZESTRIL) 10 MG tablet, Take 1 tablet (10 mg total) by mouth daily., Disp: 90 tablet, Rfl: 3 .  oxyCODONE-acetaminophen (PERCOCET) 5-325 MG tablet, Take 1 tablet by mouth every 8 (eight) hours as needed for severe pain. (Patient not taking: Reported on 12/10/2020), Disp: 15 tablet, Rfl: 0   Social History: Social History   Tobacco Use  . Smoking status: Former Games developer  . Smokeless tobacco: Never Used  Vaping Use  . Vaping Use: Never used  Substance Use Topics  . Alcohol use: Yes    Comment: occassionally-wine  . Drug use: Not Currently    Family Medical History: Family  History  Problem Relation Age of Onset  . Dementia Mother   . Cancer Father        lung smoker     Physical Examination: Vitals:   12/12/20 0830 12/12/20 0930  BP: (!) 160/73 (!) 120/98  Pulse:  70  Resp: 19 18  Temp:    SpO2: 95% 92%     General: Patient is well developed, well nourished, calm, collected, and in no apparent distress.  Psychiatric: Patient is non-anxious.  Head:  Pupils equal, round, and reactive to light.  ENT:  Oral mucosa appears well hydrated.  Neck:   Supple.  Full range of motion.  Respiratory: Patient is breathing without any difficulty.  Extremities: No edema.  Vascular: Palpable pulses in dorsal pedal vessels.  Skin:   On exposed skin, there are no abnormal skin lesions.  NEUROLOGICAL:  General: In no acute distress.   Awake, alert, oriented to person, place, and time.  Pupils equal round and reactive to light.  Facial tone is symmetric.  Tongue protrusion is midline.  There is no pronator drift.   Strength: Side Biceps Triceps Deltoid Interossei Grip Wrist Ext. Wrist Flex.  R 5 5 5 5 5 5 5   L 5 5 5 5 5 5 5    Side Iliopsoas Quads Hamstring PF DF EHL  R 5 5 5 5 5 5   L 5 5 5 5 5 5    Reflexes are 1+ and symmetric at the biceps, triceps, brachioradialis, patella and achilles.   Bilateral upper and lower extremity sensation is intact to light touch. Gait is untested.  Hoffman's is absent.  Imaging: CT Head 12/11/20 IMPRESSION: 1. Stable 4 mm right parafalcine/tentorial subdural hematoma. 2. Stable trace right occipital subarachnoid hemorrhage. 3. No new intracranial abnormality.   Electronically Signed   By: M.D.   On: 12/11/2020 06:24  MRI Brain 12/10/20 IMPRESSION: Small volume subdural hemorrhage along the dependent posterior right cerebral convexity, posterior falx, and right tentorium. Adjacent right occipital subarachnoid hemorrhage.  Diminished right sigmoid sinus flow void probably reflects slow flow.  CTV/MRV could be considered for confirmation.  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  Chronic microvascular ischemic changes.   Electronically Signed   By: 02/08/21 M.D.   On: 12/10/2020 21:14  I have personally reviewed the images and agree with the above interpretation.  Labs: CBC Latest Ref Rng & Units 12/10/2020 12/09/2020 10/11/2020  WBC 4.0 - 10.5 K/uL 9.5 11.8(H) 8.5  Hemoglobin 13.0 - 17.0 g/dL 02/07/2021 02/07/2021 12/11/2020  Hematocrit 39.0 - 52.0 % 47.5 47.2 50.4  Platelets 150 - 400 K/uL 146(L) 159 189.0       Assessment and Plan: Mr. Agrusa is a pleasant 75 y.o. male with small traumatic intracranial hemorrhage.  Hold eliquis for 7 days.  We will follow  in clinic.  Further workup for dizziness per primary team.  Patient is stable for discharge from neurosurgery perspective for the intracranial hemorrhage.   Peter Castro K. Myer Haff MD, MPHS Dept. of Neurosurgery

## 2020-12-12 NOTE — Consult Note (Signed)
Cardiology Consult    Patient ID: Peter Castro MRN: [id number], DOB/AGE: December 03, 1945   Admit date: 12/10/2020 Date of Consult: 12/12/2020  Primary Physician: McLean-Scocuzza, Pasty Spillers, MD Primary Cardiologist: Julien Nordmann, MD Requesting Provider: Drusilla Kanner, MD  Patient Profile    Peter Castro is a 75 y.o. male with a history of PAF s/p catheter ablation in [place], HTN, HL, CVA, memory loss, and remote tob abuse, who is being seen today for the evaluation of dizziness, fall, and acute subdural hemoatoma at the request of Dr. Sharl Ma.  Past Medical History   Past Medical History:  Diagnosis Date  . A-fib (HCC)    a. 10/2017 s/p cryo balloon pulm vein isolation.  . Arthritis   . Atrial flutter (HCC)    a. 01/2014 s/p RFCA.  Marland Kitchen GERD (gastroesophageal reflux disease)   . History of chicken pox   . Hyperlipidemia   . Hypertension   . Loss of memory   . Stroke (HCC)    ~2013/14  . Subarachnoid hemorrhage (HCC)    a. 12/2020 Trace R occipital SAH in setting of dizziness/fall.  . Subdural hematoma (HCC)    a. 12/2020 CT head: 10mm R parafalcine/tentorial SDH, trace R occipital SAH following fall in the setting of dizziness.    Past Surgical History:  Procedure Laterality Date  . APPENDECTOMY    . OTHER SURGICAL HISTORY     cryoballon pvi isthmus tricuspid ablation Dr. Kathrynn Ducking 01/2014 [place]      Allergies  No Known Allergies  History of Present Illness    75 y/o ? with a history of PAF s/p catheter ablation in [place], HTN, HL, CVA, memory loss, and remote tob abuse.  He lives locally by himself and was last seen in cardiology clinic in March 2021.  He has been maintained on flecainide and eliquis as an outpt and to the best of his knowledge, he has not had any recurrent afib.  He was in his [facility] until 1/31, when he was walking @ [employer] and had sudden onset of dizziness and feeling like he might lose consciousness.  He fell and struck his head.  He isn't sure if he lost  consciousness.  He continued to feel very dizzy after the fall and presented to the ED that morning.  He left prior to being seen however, but did return early on 2/1, in the setting of ongoing dizziness.  Here, ECG was w/o acute changes.  CT head showed 24mm SDH w/ trace adjacent SAH.  Lab work was unremarkable, though he was found to be COVID positive.  On further questioning, pt says that he has had a mild cough over the past 2 days, but is otw asymptomatic.  Neurosurgery was consulted w/ recommendation for repeat head CT and holding of eliquis x 7 days.  F/u CT x 2 have continued to show stable 4 mm right parafalcine/tentorial subdural hematoma and stable trace right occipital subarachnoid hemorrhage.  Pt remains very dizzy with sitting up/standing.  He has been too unsteady on his feet for nsg staff to obtain orthostatic VS.  He denies dizziness while lying in the stretcher.  Review of tele shows frequent PVCs w/ occasional bigeminy, ventricular couplets, and rarely, up to 3 beats of NSVT.  He denies chest pain, dyspnea, or palpitations.  Inpatient Medications    . amLODipine  5 mg Oral Daily  . atorvastatin  10 mg Oral q1800  . carvedilol  25 mg Oral BID WC  . flecainide  100 mg Oral BID  . lisinopril  10 mg Oral Daily    Family History    Family History  Problem Relation Age of Onset  . Dementia Mother   . Cancer Father        lung smoker    He indicated that his mother is deceased. He indicated that his father is deceased. He indicated that both of his sisters are alive. He indicated that only one of his two brothers is alive. He indicated that his son is alive.   Social History    Social History   Socioeconomic History  . Marital status: Widowed    Spouse name: Not on file  . Number of children: Not on file  . Years of education: Not on file  . Highest education level: Not on file  Occupational History  . Not on file  Tobacco Use  . Smoking status: Former Games developermoker  .  Smokeless tobacco: Never Used  Vaping Use  . Vaping Use: Never used  Substance and Sexual Activity  . Alcohol use: Yes    Comment: occassionally-wine  . Drug use: Not Currently  . Sexual activity: Not on file  Other Topics Concern  . Not on file  Social History Narrative   From [place]    Retired    Some college    [place] be with son and 2 grandkids    Widowed since ~2013/14    Owns guns    Wears seat belt    Safe in relationship    Social Determinants of Corporate investment bankerHealth   Financial Resource Strain: Not on [employer] Corporationfile  Food Insecurity: Not on file  Transportation Needs: Not on file  Physical Activity: Not on file  Stress: Not on file  Social Connections: Not on file  Intimate Partner Violence: Not on file     Review of Systems    General:  No chills, fever, night sweats or weight changes.  Cardiovascular:  No chest pain, dyspnea on exertion, edema, orthopnea, palpitations, paroxysmal nocturnal dyspnea. +++ presyncope/dizziness. Dermatological: No rash, lesions/masses Respiratory: No cough, dyspnea Urologic: No hematuria, dysuria Abdominal:   No nausea, vomiting, diarrhea, bright red blood per rectum, melena, or hematemesis Neurologic:  No visual changes, wkns, changes in mental status. All other systems reviewed and are otherwise negative except as noted above.  Physical Exam    Blood pressure 139/71, pulse 64, temperature 98.1 F (36.7 C), temperature source Oral, resp. rate 18, height 5\' 5"  (1.651 m), weight 88 kg, SpO2 97 %.  General: Pleasant, NAD Psych: Normal affect. Neuro: Alert and oriented X 3. Moves all extremities spontaneously. HEENT: Normal. No significant bruising or bleeding noted to scalp.  Neck: Supple without bruits or JVD. Lungs:  Resp regular and unlabored, CTA. Heart: RRR no s3, s4, or murmurs. Abdomen: Soft, non-tender, non-distended, BS + x 4.  Extremities: No clubbing, cyanosis or edema. DP/PT2+, Radials 2+ and equal  bilaterally.  Labs    Cardiac Enzymes Recent Labs  Lab 12/09/20 1000 12/09/20 1453 12/10/20 0658  TROPONINIHS 5 4 8       Lab Results  Component Value Date   WBC 9.5 12/10/2020   HGB 15.8 12/10/2020   HCT 47.5 12/10/2020   MCV 90.3 12/10/2020   PLT 146 (L) 12/10/2020    Recent Labs  Lab 12/10/20 0658  NA 137  K 4.0  CL 102  CO2 25  BUN 19  CREATININE 1.06  CALCIUM 8.8*  PROT 6.8  BILITOT 1.2  ALKPHOS 79  ALT 22  AST 25  GLUCOSE 112*   Lab Results  Component Value Date   CHOL 153 10/11/2020   HDL 56.10 10/11/2020   LDLCALC 84 10/11/2020   TRIG 61.0 10/11/2020    Radiology Studies    CT HEAD WO CONTRAST  Result Date: 12/11/2020 CLINICAL DATA:  Subdural hematoma follow-up EXAM: CT HEAD WITHOUT CONTRAST TECHNIQUE: Contiguous axial images were obtained from the base of the skull through the vertex without intravenous contrast. COMPARISON:  MR head 12/10/2020, FINDINGS: Brain: Redemonstration of prior left frontal infarction/encephalomalacia. No evidence of large-territorial acute infarction. No parenchymal hemorrhage. Stable right parietooccipital hyperdensity within calcification consistent with cavernous malformation. Stable thin of fall seen subdural hematoma extending along the right tentorium measuring up to 4 mm. Hyperdensity along the right occipital sulci consistent with known trace subarachnoid hemorrhage that is better evaluated on MR head 12/10/2020 (2:20). No mass effect or midline shift. No hydrocephalus. Basilar cisterns are patent. Vascular: No hyperdense vessel. Skull: No acute fracture or focal lesion. Sinuses/Orbits: Paranasal sinuses and mastoid air cells are clear. The orbits are unremarkable. Other: None. IMPRESSION: 1. Stable 4 mm right parafalcine/tentorial subdural hematoma. 2. Stable trace right occipital subarachnoid hemorrhage. 3. No new intracranial abnormality. Electronically Signed   By: Tish Frederickson M.D.   On: 12/11/2020 06:24   CT Head  Wo Contrast  Result Date: 12/10/2020 CLINICAL DATA:  Follow-up subdural hematoma. EXAM: CT HEAD WITHOUT CONTRAST TECHNIQUE: Contiguous axial images were obtained from the base of the skull through the vertex without intravenous contrast. COMPARISON:  Head CT earlier today at 1008 hour FINDINGS: Brain: Acute subdural hematoma along the falx and right tentorium, not significantly changed from earlier today. This measures up to 4 mm in thickness along the right tentorium, measured on series 4, image 46. Adjacent right occipital subarachnoid hemorrhage with slight increase from earlier today. No new hemorrhage. High density nodule in the right frontoparietal white matter measuring 1 cm with internal calcification, unchanged. No surrounding edema. Remote left MCA infarct. No evidence of acute ischemia. No hydrocephalus. No midline shift. Vascular: Atherosclerosis of skullbase vasculature without hyperdense vessel or abnormal calcification. Skull: No fracture or focal lesion. Sinuses/Orbits: Paranasal sinuses and mastoid air cells are clear. The visualized orbits are unremarkable. Other: None. IMPRESSION: 1. Stable appearance of acute subdural hematoma along the falx and right tentorium, not significantly changed from earlier today. This measures up to 4 mm in thickness along the right tentorium. 2. Adjacent right occipital subarachnoid hemorrhage with slight increase from earlier today. 3. Otherwise stable exam.  Remote left MCA infarct. Electronically Signed   By: Narda Rutherford M.D.   On: 12/10/2020 17:19   CT Head Wo Contrast  Result Date: 12/10/2020 CLINICAL DATA:  Dizziness with fall. EXAM: CT HEAD WITHOUT CONTRAST CT CERVICAL SPINE WITHOUT CONTRAST TECHNIQUE: Multidetector CT imaging of the head and cervical spine was performed following the standard protocol without intravenous contrast. Multiplanar CT image reconstructions of the cervical spine were also generated. COMPARISON:  10/13/2020 FINDINGS: CT HEAD  FINDINGS Brain: Large remote left MCA branch infarct affecting the high and lateral left frontal lobe. High-density nodule in the right frontal parietal white matter measuring 1 cm and showing internal coarse calcification. No perilesional edema or interval change. High-density subdural hematoma along the falx and right tentorium which measures up to 4 mm in thickness. Trace subarachnoid hemorrhage along the inferior right occipital lobe. No evidence of acute infarct.  No hydrocephalus or masslike finding Vascular: Atheromatous calcification  Skull: Negative Sinuses/Orbits: Negative Critical Value/emergent results were called by telephone at the time of interpretation on 12/10/2020 at 10:28 am to provider Jene Every , who verbally acknowledged these results. CT CERVICAL SPINE FINDINGS Alignment: No traumatic malalignment Skull base and vertebrae: No acute fracture Soft tissues and spinal canal: No prevertebral fluid or swelling. No visible canal hematoma. Disc levels: Generalized disc space narrowing and endplate ridging. 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 Multilevel foraminal narrowing Upper chest: Emphysematous spaces. IMPRESSION: 1. Acute subdural hematoma along the falx and right tentorium, up to 4 mm in thickness. Trace adjacent subarachnoid hemorrhage. 2. Remote left MCA branch infarct. 3. Stable 1 cm nodule in the right cerebral white matter again favoring cavernoma or other benign process. 4. Negative for cervical spine fracture. Electronically Signed   By: Marnee Spring M.D.   On: 12/10/2020 10:33   CT CERVICAL SPINE WO CONTRAST  Result Date: 12/10/2020 CLINICAL DATA:  Dizziness with fall. EXAM: CT HEAD WITHOUT CONTRAST CT CERVICAL SPINE WITHOUT CONTRAST TECHNIQUE: Multidetector CT imaging of the head and cervical spine was performed following the standard protocol without intravenous contrast. Multiplanar CT image reconstructions of the cervical spine were also generated.  COMPARISON:  10/13/2020 FINDINGS: CT HEAD FINDINGS Brain: Large remote left MCA branch infarct affecting the high and lateral left frontal lobe. High-density nodule in the right frontal parietal white matter measuring 1 cm and showing internal coarse calcification. No perilesional edema or interval change. High-density subdural hematoma along the falx and right tentorium which measures up to 4 mm in thickness. Trace subarachnoid hemorrhage along the inferior right occipital lobe. No evidence of acute infarct.  No hydrocephalus or masslike finding Vascular: Atheromatous calcification Skull: Negative Sinuses/Orbits: Negative Critical Value/emergent results were called by telephone at the time of interpretation on 12/10/2020 at 10:28 am to provider Jene Every , who verbally acknowledged these results. CT CERVICAL SPINE FINDINGS Alignment: No traumatic malalignment Skull base and vertebrae: No acute fracture Soft tissues and spinal canal: No prevertebral fluid or swelling. No visible canal hematoma. Disc levels: Generalized disc space narrowing and endplate ridging. 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 Multilevel foraminal narrowing Upper chest: Emphysematous spaces. IMPRESSION: 1. Acute subdural hematoma along the falx and right tentorium, up to 4 mm in thickness. Trace adjacent subarachnoid hemorrhage. 2. Remote left MCA branch infarct. 3. Stable 1 cm nodule in the right cerebral white matter again favoring cavernoma or other benign process. 4. Negative for cervical spine fracture. Electronically Signed   By: Marnee Spring M.D.   On: 12/10/2020 10:33   MR BRAIN WO CONTRAST  Result Date: 12/10/2020 CLINICAL DATA:  Dizziness, subdural hematoma EXAM: MRI HEAD WITHOUT CONTRAST TECHNIQUE: Multiplanar, multiecho pulse sequences of the brain and surrounding structures were obtained without intravenous contrast. COMPARISON:  Question made with prior CT imaging FINDINGS: Brain: There is no  acute infarction. Subdural hemorrhage is present along the dependent posterior right cerebral convexity, posterior falx, and right tentorium. There is right occipital sulcal T2 FLAIR hyperintensity reflecting subarachnoid hemorrhage. Chronic left frontal infarct with encephalomalacia and gliosis and chronic blood products. Heterogeneous 8 mm lesion in the parasagittal right parietal lobe with corresponding susceptibility is most consistent with a cavernous malformation. Additional susceptibility hypointensity in peripheral right temporal lobe likely reflects chronic blood products. Additional patchy T2 hyperintensity in the supratentorial white matter is nonspecific but probably reflects chronic microvascular ischemic changes. Vascular: Diminished right sigmoid sinus flow void probably reflects slow flow. No corresponding abnormal T1 hyperintensity. Major arterial  flow voids at the skull base are preserved. Skull and upper cervical spine: Normal marrow signal is preserved. Sinuses/Orbits: Paranasal sinuses are aerated. Orbits are unremarkable. Other: Sella is unremarkable.  Mastoid air cells are clear. IMPRESSION: Small volume subdural hemorrhage along the dependent posterior right cerebral convexity, posterior falx, and right tentorium. Adjacent right occipital subarachnoid hemorrhage. Diminished right sigmoid sinus flow void probably reflects slow flow. CTV/MRV could be considered for confirmation. 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 Chronic microvascular ischemic changes. Electronically Signed   By: Guadlupe Spanish M.D.   On: 12/10/2020 21:14   DG Chest Port 1 View  Result Date: 12/10/2020 CLINICAL DATA:  Weakness. EXAM: PORTABLE CHEST 1 VIEW COMPARISON:  Chest/rib radiographs and chest CT 10/13/2020 FINDINGS: The cardiac silhouette is within normal limits for portable AP technique. A moderate-sized sliding hiatal hernia and thoracic aortic atherosclerosis are  noted. There is mild chronic coarsening of the interstitial markings. No acute airspace consolidation, edema, pleural effusion, or pneumothorax is identified. No acute osseous abnormality is seen. IMPRESSION: No active disease. Electronically Signed   By: Sebastian Ache M.D.   On: 12/10/2020 08:53   MR MRV HEAD W WO CONTRAST  Result Date: 12/11/2020 CLINICAL DATA:  Possible abnormal sigmoid sinus on MRI brain EXAM: MR VENOGRAM HEAD WITHOUT AND WITH CONTRAST TECHNIQUE: Angiographic images of the intracranial venous structures were obtained using MRV technique without and with intravenous contrast. CONTRAST:  7.36mL GADAVIST GADOBUTROL 1 MMOL/ML IV SOLN COMPARISON:  Correlation made with prior MRI brain FINDINGS: Superior sagittal sinus, straight sinus, vein of Galen, internal cerebral veins are patent. Transverse sinuses are patent, dominant on the left. Right larger than left filling defects at the distal transverse sinuses likely reflect arachnoid granulations. Bilateral sigmoid sinuses are patent. IMPRESSION: No evidence of dural sinus thrombosis. Electronically Signed   By: Guadlupe Spanish M.D.   On: 12/11/2020 15:57    ECG & Cardiac Imaging    RSR, 68, 1st deg AVB, no acute ST/T changes - personally reviewed.  Assessment & Plan    1.  Dizziness/Fall:  Pt developed sudden dizziness on 1/31 while walking @ [employer].  He then fell and struck his head.  He has been persistently dizzy since then, esp when sitting up or standing.  Head CT has shown a 4 mm right parafalcine/tentorial subdural hematoma and trace right occipital subarachnoid hemorrhage.  Both have been stable on serial CTs.  His BPs have been stable and obtaining orthostatics has been complicated by his inability to stand for long periods due to dizziness.  On tele, he has had frequent PVCs including bigeminy, and rare couplets/3 beats NSVT, though he does not appear to be experience symptoms related to this.  We will obtain an echo given freq  PVCs on flecainide therapy.  At this point it does not appear that prolonged hypotension or arrhythmia is playing a role.   PT/OT seeing w/ rec for [facility] and supervision.  ? Vertigo.  2.  SDH/SAH:  In setting of above.  Seen by [facility] w/ rec to hold eliquis x 1 wk.  Serial CTs w/ stable findings.  3.  PAF/Flutter: s/p prior catheter ablations.  No known recurrence.  Ok to hold eliquis in the setting of above.  Cont flecainide and  blocker.  Will obtain echo and likely pursue outpt ischemic eval in setting of PVCs.  4.  Freq PVCs:  Noted while in ED.  Asymptomatic.  Cont  blocker.  Obtain echo to eval EF or  new wall motion abnormalities.  Provided EF is nl, will need outpt ischemic eval given finding of PVCs, esp since he is on flecainide, which would be contraindicated if ischemic heart dzs present.  Signed, Nicolasa Ducking, NP 12/12/2020, 1:30 PM  For questions or updates, please contact   Please consult [email or web address] for contact info under Cardiology/STEMI.

## 2020-12-12 NOTE — Evaluation (Signed)
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  Incidentally, also noted to be covid + during admission.  Clinical Impression  Patient resting in dark room, supine in bed upon arrival for evaluation.  Alert and oriented; follows commands and demonstrates good effort with mobility tasks.  Bilat UE/LE strength and ROM grossly symmetrical and WFL; no focal weakness, sensory or coordination deficits appreciated.  Able to complete bed mobility with close sup; sit/stand, basic transfers and short-distance gait (15') with L HHA, min assist.  Demonstrates very choppy stepping pattern and overall gait performance; inconsistent step height/length, foot placement; mild abdominal flaring, truncal instability with gait. Limited ability to participate with head turns and dynamic gait components due to dizziness.  May benefit from trial of RW next session. Vestibular screening-orthostatics negative; visual tracking, scanning and bilat eye ROM symmetrical and WFL; normal VOR and VOR cancellation (though slow, guarded head turns); absent skew; no nystagmus.  May benefit from continued screening for post-concussive symptoms given reported fall versus full vestibular assessment.  Will continue to follow. Would benefit from skilled PT to address above deficits and promote optimal return to PLOF.; recommend transition to STR upon discharge from acute hospitalization.  Will continue to assess and update recommendations as appropriate.      Follow Up Recommendations SNF    Equipment Recommendations  Rolling walker with 5" wheels    Recommendations for Other Services        Precautions / Restrictions Precautions Precautions: Fall Restrictions Weight Bearing Restrictions: No      Mobility  Bed Mobility Overal bed mobility: Needs Assistance Bed Mobility: Supine to Sit     Supine to sit: Supervision     General bed mobility comments: increased effort and use of UEs required for truncal elevation with supine/sit    Transfers Overall transfer level: Needs assistance Equipment used: 1 person hand held assist Transfers: Sit to/from Stand Sit to Stand: Min assist         General transfer comment: increased sway in A/P plane, mild abdominal flaring in stance; persistent reports of dizziness  Ambulation/Gait Ambulation/Gait assistance: Min assist Gait Distance (Feet): 15 Feet Assistive device: 1 person hand held assist       General Gait Details: very choppy stepping pattern and overall gait performance; inconsistent step height/length, foot placement; mild abdominal flaring, truncal instability with gait. Limited ability to participate with head turns and dynamic gait components due to dizziness.  May benefit from trial of RW next session.  Stairs            Wheelchair Mobility    Modified Rankin (Stroke Patients Only)       Balance Overall balance assessment: Needs assistance Sitting-balance support: No upper extremity supported;Feet supported Sitting balance-Leahy Scale: Good     Standing balance support: Single extremity supported Standing balance-Leahy Scale: Poor                               Pertinent Vitals/Pain Pain Assessment: No/denies pain    Home Living Family/patient expects to be discharged to:: Private residence Living Arrangements: Alone   Type of Home: House Home Access: Stairs to enter Entrance Stairs-Rails: None Entrance [employer]  of Steps: 2-3 Home Layout: One level        Prior Function Level of Independence: Independent         Comments: Pt is widower and have no family  nearby that can provide supervision/assist at discharge. Indep with ADLs, household and community mobilization upon discharge.  Denies additional fall history.     Hand Dominance   Dominant Hand: Right    Extremity/Trunk Assessment   Upper Extremity Assessment Upper Extremity Assessment: Overall WFL for tasks assessed    Lower Extremity Assessment Lower Extremity Assessment: Overall WFL for tasks assessed (grossly 4+ to 5/5 throughout; no focal weakness or asymmetry noted)       Communication   Communication: No difficulties  Cognition Arousal/Alertness: Awake/alert Behavior During Therapy: WFL for tasks assessed/performed Overall Cognitive Status: Within Functional Limits for tasks assessed                                        General Comments      Exercises Other Exercises Other Exercises: Vestibular screening-visual tracking, scanning and bilat eye ROM symmetrical and WFL; normal VOR and VOR cancellation (though slow, guarded head turns); absent skew; no nystagmus.  May benefit from continued screening for post-concussive symptoms given reported fall versus full vestibular assessment.  Will continue to follow.   Assessment/Plan    PT Assessment Patient needs continued PT services  PT Problem List Decreased activity tolerance;Decreased balance;Decreased mobility;Decreased coordination;Decreased knowledge of use of DME;Decreased safety awareness;Decreased knowledge of precautions       PT Treatment Interventions Gait training;Functional mobility training;DME instruction;Therapeutic activities;Therapeutic exercise;Balance training;Neuromuscular re-education;Patient/family education    PT Goals (Current goals can be found in the Care Plan section)  Acute Rehab PT Goals Patient Stated Goal: to go home PT Goal Formulation: With patient Time For Goal Achievement: 12/26/20 Potential to Achieve Goals: Good    Frequency Min 2X/week   Barriers to  discharge        Co-evaluation               AM-PAC PT "6 Clicks" Mobility  Outcome Measure Help needed turning from your back to your side while in a flat bed without using bedrails?: None Help needed moving from lying on your back to sitting on the side of a flat bed without using bedrails?: A Little Help needed moving to and from a bed to a chair (including a wheelchair)?: A Little Help needed standing up from a chair using your arms (e.g., wheelchair or bedside chair)?: A Little Help needed to walk in hospital room?: A Little Help needed climbing 3-5 steps with a railing? : A Little 6 Click Score: 19    End of Session Equipment Utilized During Treatment: Gait belt Activity Tolerance: Patient tolerated treatment well Patient left: in bed;with call bell/phone within reach Nurse Communication: Mobility status PT Visit Diagnosis: Muscle weakness (generalized) (M62.81);Difficulty in walking, not elsewhere classified (R26.2)    Time: 7124-5809 PT Time Calculation (min) (ACUTE ONLY): 18 min   Charges:   PT Evaluation $PT Eval Moderate Complexity: 1 Mod         Kristen H. Manson Passey, PT, DPT, NCS 12/12/20, 3:52 PM [phone]

## 2020-12-12 NOTE — ED Notes (Signed)
Message sent to pharmacy to verify flecainide

## 2020-12-12 NOTE — Progress Notes (Signed)
Patient with small tentorial SDH which is stable. There is no surgical intervention for this and recommend holding anticoagulation and antiplatelet therapy for a week. He will need workup for his dizziness and falls which is not caused by the imaging findings but rather traumatic in nature. We can follow up as outpatient in clinic.

## 2020-12-12 NOTE — Evaluation (Signed)
Occupational Therapy Evaluation Patient Details Name: Peter Castro MRN: [id number] DOB: 04-Apr-1946 Today's Date: 12/12/2020    History of Present Illness 75 year old male with history of hypertension, hyperlipidemia, stroke, GERD, atrial fibrillation on Eliquis presented with dizziness and fall.  Patient said that he has been having dizziness for past 2 days denied visual loss or hearing loss.  No numbness or tingling in extremities.  No facial droop or slurred speech.  He came to ED yesterday but left ED before being seen.  He then went to [facility] and fell.  He denied head or neck injury.  He continued to have dizziness.  In the ED, lab work showed positive COVID-19 PCR.  CT head showed SDH and SAH   Clinical Impression    Patient presenting with decreased I in self care, balance, functional mobility/transfers, endurance, and safety awareness. Patient lives alone in single level home with 2-3 STE PTA. Pt reports being independent in all aspects of self care, driving, and performs all IADL tasks.  Patient currently functioning at min A overall but limited secondary to dizziness. Pt's orthostatics are as follows: Supine 120/72, EOB 138/115, and standing 119/68. Pt only able to take 2 side steps to the R secondary to increased dizziness with mobility. Pt does not have any family nearby and no one is able to provide supervision/assist at discharge.  Patient will benefit from acute OT to increase overall independence in the areas of ADLs, functional mobility, and safety awareness in order to safely discharge to next venue of care.   Follow Up Recommendations  SNF;Supervision/Assistance - 24 hour    Equipment Recommendations  Other (comment) (defer to next venue of care)       Precautions / Restrictions Precautions Precautions: Fall      Mobility Bed Mobility Overal bed mobility: Needs Assistance Bed Mobility: Supine to Sit;Sit to Supine     Supine to sit: Supervision Sit to supine:  Supervision   General bed mobility comments: increased effort, HOB elevated, and use of bed rails to come to EOB with increased time. No physical assist given but needed min cuing for technique    Transfers Overall transfer level: Needs assistance Equipment used: None Transfers: Sit to/from [employer] Sit to Stand: Min assist Stand pivot transfers: Min assist       General transfer comment: min A for balance with reports of dizziness    Balance Overall balance assessment: Needs assistance Sitting-balance support: Feet supported Sitting balance-Leahy Scale: Good Sitting balance - Comments: no LOB   Standing balance support: During functional activity Standing balance-Leahy Scale: Poor                             ADL either performed or assessed with clinical judgement   ADL Overall ADL's : Needs assistance/impaired Eating/Feeding: Independent   Grooming: Wash/dry hands;Wash/dry face;Sitting;Supervision/safety;Set up               Lower Body Dressing: Set up;Bed level Lower Body Dressing Details (indicate cue type and reason): dons socks while in bed before coming to EOB without assistance                     Vision Patient Visual Report: No change from baseline              Pertinent Vitals/Pain Pain Assessment: No/denies pain     Hand Dominance Right   Extremity/Trunk Assessment Upper Extremity Assessment Upper Extremity Assessment:  Overall [facility] for tasks assessed   Lower Extremity Assessment Lower Extremity Assessment: Defer to PT evaluation   Cervical / Trunk Assessment Cervical / Trunk Assessment: Normal   Communication Communication Communication: No difficulties   Cognition Arousal/Alertness: Awake/alert Behavior During Therapy: WFL for tasks assessed/performed Overall Cognitive Status: Within Functional Limits for tasks assessed                                 General Comments: cooperative  and pleasant              Home Living Family/patient expects to be discharged to:: Private residence Living Arrangements: Alone   Type of Home: House Home Access: Stairs to enter Secretary/administrator of Steps: 2-3 Entrance Stairs-Rails: None Home Layout: One level     Bathroom Shower/Tub: Tub/shower unit         Home Equipment: Shower seat          Prior Functioning/Environment Level of Independence: Independent        Comments: Pt is widower and have no family nearby that can provide supervision/assist at discharge        OT Problem List: Decreased strength;Decreased activity tolerance;Impaired balance (sitting and/or standing);Decreased safety awareness;Decreased knowledge of use of DME or AE      OT Treatment/Interventions: Self-care/ADL training;Manual therapy;Therapeutic exercise;Neuromuscular education;Energy conservation;DME and/or AE instruction;Cognitive remediation/compensation;Therapeutic activities;Balance training;Patient/family education    OT Goals(Current goals can be found in the care plan section) Acute Rehab OT Goals Patient Stated Goal: to go home OT Goal Formulation: With patient Time For Goal Achievement: 12/26/20 Potential to Achieve Goals: Good ADL Goals Pt Will Perform Grooming: with modified independence;sitting Pt Will Perform Lower Body Dressing: with modified independence;sit to/from stand Pt Will Transfer to Toilet: with modified independence;ambulating Pt Will Perform Toileting - Clothing Manipulation and hygiene: with modified independence;sit to/from stand  OT Frequency: Min 2X/week   Barriers to D/C: Decreased caregiver support             AM-PAC OT "6 Clicks" Daily Activity     Outcome Measure Help from another person eating meals?: None Help from another person taking care of personal grooming?: A Little Help from another person toileting, which includes using toliet, bedpan, or urinal?: A Lot Help from another  person bathing (including washing, rinsing, drying)?: A Lot Help from another person to put on and taking off regular upper body clothing?: A Little Help from another person to put on and taking off regular lower body clothing?: A Lot 6 Click Score: 16   End of Session Nurse Communication: Mobility status  Activity Tolerance: Other (comment) (limited by dizziness) Patient left: in bed;with call bell/phone within reach;Other (comment) (MD present)  OT Visit Diagnosis: Unsteadiness on feet (R26.81);Repeated falls (R29.6);Muscle weakness (generalized) (M62.81)                Time: 5631-4970 OT Time Calculation (min): 24 min Charges:  OT General Charges $OT Visit: 1 Visit OT Evaluation $OT Eval Low Complexity: 1 Low OT Treatments $Self Care/Home Management : 8-22 mins  Jackquline Denmark, MS, OTR/L , CBIS ascom [phone]  12/12/20, 12:36 PM

## 2020-12-13 DIAGNOSIS — E785 Hyperlipidemia, unspecified: Secondary | ICD-10-CM | POA: Diagnosis not present

## 2020-12-13 DIAGNOSIS — I480 Paroxysmal atrial fibrillation: Secondary | ICD-10-CM | POA: Diagnosis not present

## 2020-12-13 DIAGNOSIS — S065X9A Traumatic subdural hemorrhage with loss of consciousness of unspecified duration, initial encounter: Secondary | ICD-10-CM | POA: Diagnosis not present

## 2020-12-13 DIAGNOSIS — I10 Essential (primary) hypertension: Secondary | ICD-10-CM | POA: Diagnosis not present

## 2020-12-13 NOTE — Progress Notes (Signed)
Progress Note  Patient Name: Peter Castro Date of Encounter: 12/13/2020  Primary Cardiologist: Julien Nordmann, MD  Subjective   Dizziness only slightly better.  Tried walking to the bathroom and became more dizzy - fearful he might fall.  He notes mild cough, but no dyspnea or chest pain.  Inpatient Medications    Scheduled Meds: . amLODipine  5 mg Oral Daily  . atorvastatin  10 mg Oral q1800  . carvedilol  25 mg Oral BID WC  . flecainide  100 mg Oral BID  . lisinopril  10 mg Oral Daily   Continuous Infusions: . sodium chloride 10 mL/hr at 12/12/20 0810   PRN Meds: acetaminophen **OR** acetaminophen (TYLENOL) oral liquid 160 mg/5 mL **OR** acetaminophen, hydrALAZINE, meclizine, ondansetron (ZOFRAN) IV, senna-docusate   Vital Signs    Vitals:   12/12/20 2012 12/13/20 0007 12/13/20 0532 12/13/20 0933  BP: 120/65 116/78 (!) 124/92 (!) 142/76  Pulse: 67 62 68 75  Resp: 15 15 16 14   Temp: (!) 97.4 F (36.3 C) 97.6 F (36.4 C) 98.6 F (37 C) 98.9 F (37.2 C)  TempSrc:  Oral Oral   SpO2: 96% 95% 95% 95%  Weight:      Height:        Intake/Output Summary (Last 24 hours) at 12/13/2020 1050 Last data filed at 12/13/2020 0500 Gross per 24 hour  Intake --  Output 400 ml  Net -400 ml   Filed Weights   12/10/20 0655  Weight: 88 kg    Physical Exam   GEN: Well nourished, well developed, in no acute distress.  HEENT: Grossly normal.  Neck: Supple, no JVD, carotid bruits, or masses. Cardiac: RRR, no murmurs, rubs, or gallops. No clubbing, cyanosis, edema.  Radials 2+, DP/PT 2+ and equal bilaterally.  Respiratory:  Respirations regular and unlabored, CTA bilaterally. GI: Soft, nontender, nondistended, BS + x 4. MS: no deformity or atrophy. Skin: warm and dry, no rash. Neuro:  Strength and sensation are intact. Psych: AAOx3.  Normal affect.  Labs    Chemistry Recent Labs  Lab 12/09/20 1000 12/10/20 0658  NA 137 137  K 4.4 4.0  CL 101 102  CO2 27 25   GLUCOSE 117* 112*  BUN 19 19  CREATININE 1.18 1.06  CALCIUM 8.8* 8.8*  PROT  --  6.8  ALBUMIN  --  4.1  AST  --  25  ALT  --  22  ALKPHOS  --  79  BILITOT  --  1.2  GFRNONAA >60 >60  ANIONGAP 9 10     Hematology Recent Labs  Lab 12/09/20 1000 12/10/20 0658  WBC 11.8* 9.5  RBC 5.25 5.26  HGB 15.8 15.8  HCT 47.2 47.5  MCV 89.9 90.3  MCH 30.1 30.0  MCHC 33.5 33.3  RDW 14.1 14.5  PLT 159 146*    Cardiac Enzymes  Recent Labs  Lab 12/09/20 1000 12/09/20 1453 12/10/20 0658  TROPONINIHS 5 4 8       Lipids  Lab Results  Component Value Date   CHOL 153 10/11/2020   HDL 56.10 10/11/2020   LDLCALC 84 10/11/2020   TRIG 61.0 10/11/2020   CHOLHDL 3 10/11/2020    Radiology    CT HEAD WO CONTRAST  Result Date: 12/11/2020 CLINICAL DATA:  Subdural hematoma follow-up EXAM: CT HEAD WITHOUT CONTRAST TECHNIQUE: Contiguous axial images were obtained from the base of the skull through the vertex without intravenous contrast. COMPARISON:  MR head 12/10/2020, FINDINGS: Brain: Redemonstration of prior left  frontal infarction/encephalomalacia. No evidence of large-territorial acute infarction. No parenchymal hemorrhage. Stable right parietooccipital hyperdensity within calcification consistent with cavernous malformation. Stable thin of fall seen subdural hematoma extending along the right tentorium measuring up to 4 mm. Hyperdensity along the right occipital sulci consistent with known trace subarachnoid hemorrhage that is better evaluated on MR head 12/10/2020 (2:20). No mass effect or midline shift. No hydrocephalus. Basilar cisterns are patent. Vascular: No hyperdense vessel. Skull: No acute fracture or focal lesion. Sinuses/Orbits: Paranasal sinuses and mastoid air cells are clear. The orbits are unremarkable. Other: None. IMPRESSION: 1. Stable 4 mm right parafalcine/tentorial subdural hematoma. 2. Stable trace right occipital subarachnoid hemorrhage. 3. No new intracranial abnormality.  Electronically Signed   By: Tish Frederickson M.D.   On: 12/11/2020 06:24   CT Head Wo Contrast  Result Date: 12/10/2020 CLINICAL DATA:  Follow-up subdural hematoma. EXAM: CT HEAD WITHOUT CONTRAST TECHNIQUE: Contiguous axial images were obtained from the base of the skull through the vertex without intravenous contrast. COMPARISON:  Head CT earlier today at 1008 hour FINDINGS: Brain: Acute subdural hematoma along the falx and right tentorium, not significantly changed from earlier today. This measures up to 4 mm in thickness along the right tentorium, measured on series 4, image 46. Adjacent right occipital subarachnoid hemorrhage with slight increase from earlier today. No new hemorrhage. High density nodule in the right frontoparietal white matter measuring 1 cm with internal calcification, unchanged. No surrounding edema. Remote left MCA infarct. No evidence of acute ischemia. No hydrocephalus. No midline shift. Vascular: Atherosclerosis of skullbase vasculature without hyperdense vessel or abnormal calcification. Skull: No fracture or focal lesion. Sinuses/Orbits: Paranasal sinuses and mastoid air cells are clear. The visualized orbits are unremarkable. Other: None. IMPRESSION: 1. Stable appearance of acute subdural hematoma along the falx and right tentorium, not significantly changed from earlier today. This measures up to 4 mm in thickness along the right tentorium. 2. Adjacent right occipital subarachnoid hemorrhage with slight increase from earlier today. 3. Otherwise stable exam.  Remote left MCA infarct. Electronically Signed   By: Narda Rutherford M.D.   On: 12/10/2020 17:19   CT Head Wo Contrast  Result Date: 12/10/2020 CLINICAL DATA:  Dizziness with fall. EXAM: CT HEAD WITHOUT CONTRAST CT CERVICAL SPINE WITHOUT CONTRAST TECHNIQUE: Multidetector CT imaging of the head and cervical spine was performed following the standard protocol without intravenous contrast. Multiplanar CT image reconstructions  of the cervical spine were also generated. COMPARISON:  10/13/2020 FINDINGS: CT HEAD FINDINGS Brain: Large remote left MCA branch infarct affecting the high and lateral left frontal lobe. High-density nodule in the right frontal parietal white matter measuring 1 cm and showing internal coarse calcification. No perilesional edema or interval change. High-density subdural hematoma along the falx and right tentorium which measures up to 4 mm in thickness. Trace subarachnoid hemorrhage along the inferior right occipital lobe. No evidence of acute infarct.  No hydrocephalus or masslike finding Vascular: Atheromatous calcification Skull: Negative Sinuses/Orbits: Negative Critical Value/emergent results were called by telephone at the time of interpretation on 12/10/2020 at 10:28 am to provider Jene Every , who verbally acknowledged these results. CT CERVICAL SPINE FINDINGS Alignment: No traumatic malalignment Skull base and vertebrae: No acute fracture Soft tissues and spinal canal: No prevertebral fluid or swelling. No visible canal hematoma. Disc levels: Generalized disc space narrowing and endplate ridging. 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 Multilevel foraminal narrowing Upper chest: Emphysematous spaces. IMPRESSION: 1. Acute subdural hematoma along the falx and right tentorium, up  to 4 mm in thickness. Trace adjacent subarachnoid hemorrhage. 2. Remote left MCA branch infarct. 3. Stable 1 cm nodule in the right cerebral white matter again favoring cavernoma or other benign process. 4. Negative for cervical spine fracture. Electronically Signed   By: Marnee SpringJonathon  Watts M.D.   On: 12/10/2020 10:33   CT CERVICAL SPINE WO CONTRAST  Result Date: 12/10/2020 CLINICAL DATA:  Dizziness with fall. EXAM: CT HEAD WITHOUT CONTRAST CT CERVICAL SPINE WITHOUT CONTRAST TECHNIQUE: Multidetector CT imaging of the head and cervical spine was performed following the standard protocol without intravenous  contrast. Multiplanar CT image reconstructions of the cervical spine were also generated. COMPARISON:  10/13/2020 FINDINGS: CT HEAD FINDINGS Brain: Large remote left MCA branch infarct affecting the high and lateral left frontal lobe. High-density nodule in the right frontal parietal white matter measuring 1 cm and showing internal coarse calcification. No perilesional edema or interval change. High-density subdural hematoma along the falx and right tentorium which measures up to 4 mm in thickness. Trace subarachnoid hemorrhage along the inferior right occipital lobe. No evidence of acute infarct.  No hydrocephalus or masslike finding Vascular: Atheromatous calcification Skull: Negative Sinuses/Orbits: Negative Critical Value/emergent results were called by telephone at the time of interpretation on 12/10/2020 at 10:28 am to provider Jene EveryOBERT KINNER , who verbally acknowledged these results. CT CERVICAL SPINE FINDINGS Alignment: No traumatic malalignment Skull base and vertebrae: No acute fracture Soft tissues and spinal canal: No prevertebral fluid or swelling. No visible canal hematoma. Disc levels: Generalized disc space narrowing and endplate ridging. 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 Multilevel foraminal narrowing Upper chest: Emphysematous spaces. IMPRESSION: 1. Acute subdural hematoma along the falx and right tentorium, up to 4 mm in thickness. Trace adjacent subarachnoid hemorrhage. 2. Remote left MCA branch infarct. 3. Stable 1 cm nodule in the right cerebral white matter again favoring cavernoma or other benign process. 4. Negative for cervical spine fracture. Electronically Signed   By: Marnee SpringJonathon  Watts M.D.   On: 12/10/2020 10:33   MR BRAIN WO CONTRAST  Result Date: 12/10/2020 CLINICAL DATA:  Dizziness, subdural hematoma EXAM: MRI HEAD WITHOUT CONTRAST TECHNIQUE: Multiplanar, multiecho pulse sequences of the brain and surrounding structures were obtained without intravenous  contrast. COMPARISON:  Question made with prior CT imaging FINDINGS: Brain: There is no acute infarction. Subdural hemorrhage is present along the dependent posterior right cerebral convexity, posterior falx, and right tentorium. There is right occipital sulcal T2 FLAIR hyperintensity reflecting subarachnoid hemorrhage. Chronic left frontal infarct with encephalomalacia and gliosis and chronic blood products. Heterogeneous 8 mm lesion in the parasagittal right parietal lobe with corresponding susceptibility is most consistent with a cavernous malformation. Additional susceptibility hypointensity in peripheral right temporal lobe likely reflects chronic blood products. Additional patchy T2 hyperintensity in the supratentorial white matter is nonspecific but probably reflects chronic microvascular ischemic changes. Vascular: Diminished right sigmoid sinus flow void probably reflects slow flow. No corresponding abnormal T1 hyperintensity. Major arterial flow voids at the skull base are preserved. Skull and upper cervical spine: Normal marrow signal is preserved. Sinuses/Orbits: Paranasal sinuses are aerated. Orbits are unremarkable. Other: Sella is unremarkable.  Mastoid air cells are clear. IMPRESSION: Small volume subdural hemorrhage along the dependent posterior right cerebral convexity, posterior falx, and right tentorium. Adjacent right occipital subarachnoid hemorrhage. Diminished right sigmoid sinus flow void probably reflects slow flow. CTV/MRV could be considered for confirmation. 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 Chronic microvascular ischemic changes. Electronically Signed   By: Jackquline BerlinPraneil  Patel M.D.  On: 12/10/2020 21:14   DG Chest Port 1 View  Result Date: 12/10/2020 CLINICAL DATA:  Weakness. EXAM: PORTABLE CHEST 1 VIEW COMPARISON:  Chest/rib radiographs and chest CT 10/13/2020 FINDINGS: The cardiac silhouette is within normal limits for portable AP  technique. A moderate-sized sliding hiatal hernia and thoracic aortic atherosclerosis are noted. There is mild chronic coarsening of the interstitial markings. No acute airspace consolidation, edema, pleural effusion, or pneumothorax is identified. No acute osseous abnormality is seen. IMPRESSION: No active disease. Electronically Signed   By: Sebastian Ache M.D.   On: 12/10/2020 08:53   MR MRV HEAD W WO CONTRAST  Result Date: 12/11/2020 CLINICAL DATA:  Possible abnormal sigmoid sinus on MRI brain EXAM: MR VENOGRAM HEAD WITHOUT AND WITH CONTRAST TECHNIQUE: Angiographic images of the intracranial venous structures were obtained using MRV technique without and with intravenous contrast. CONTRAST:  7.43mL GADAVIST GADOBUTROL 1 MMOL/ML IV SOLN COMPARISON:  Correlation made with prior MRI brain FINDINGS: Superior sagittal sinus, straight sinus, vein of Galen, internal cerebral veins are patent. Transverse sinuses are patent, dominant on the left. Right larger than left filling defects at the distal transverse sinuses likely reflect arachnoid granulations. Bilateral sigmoid sinuses are patent. IMPRESSION: No evidence of dural sinus thrombosis. Electronically Signed   By: Guadlupe Spanish M.D.   On: 12/11/2020 15:57    Telemetry    RSR, freq PVC's - less frequent than yesterday - Personally Reviewed  Cardiac Studies   2D Echocardiogram - pending  Patient Profile     75 y.o. male with a history of PAF s/p catheter ablation in [place], HTN, HL, CVA, memory loss, and remote tob abuse, who was admitted 2/1 w/ dizziness  fall  34mm right parafalcine/tentorial subdural hematoma and trace right occipital subarachnoid hemorrhage. Eliquis on hold.  Assessment & Plan    1.  Dizziness/Fall:  Pt developed sudden dizziness on 1/31 while walking @ [employer].  He then fell and struck his head.  He has had persistent dizziness since then, and this is worsened by sitting up and standing (very unsteady).  Currently on meclizine PRN.   Orthostatic VS have not been able to be completed due to unsteadiness but until this AM, BPs have been trending on the high side (140-150; 1-teens to 120s this AM).  Head CT has shown a right parafalcine/tentorial subdural hematoma and a trace right occipital subarachnoid hemorrhage.  This has been stable on serial CTs and MRI does not show any acute stroke.  Seen by PT yesterday w/o evidence of vestibular dysfunction.  At this point, etiology of dizziness unclear.  As he says dizziness preceded fall, and there is no significant scalp bruising, or evidence of superficial trauma on CT/exam, must question possibility of spontaneous bleed.  Eliquis on hold.  He has had freq PVCs on telemetry, though this has improved and he denies Ss @ rest, thus do not think these are contributing (see below). Finally, he has been found to be COVID positive.  ? If dizziness may be a manifestation of infection.  2.  SDH/SAH:  65mm right parafalcine/tentorial subdural hematoma and stable trace right occipital subarachnoid hemorrhage in setting of above.  Stable on serial CTs and MRI.  As above, ? Possibility of spontaneous bleed on eliquis vs 2/2 fall w/ tramua (no significant superficial trauma noted).  Eliquis on hold x @ least 1 week, but likely needs to hold until he is steadier on his feet.  Mgmt per NSU.  3.  PAF/Flutter:  S/p catheter ablations.  Maintaining sinus on flecainide and  blocker.  Echo pending.  If EF down, will need further ischemic eval (HsTrop nl) and alternate antiarrhythmic.  4.  Freq PVCs:  Frequent bigeminy and PVCs w/ rare couplets and triplets while in ED.  Cont to have freq PVCs on tele but overall burden seems to be less.  Asymptomatic, thus I don't think that these are contributing to dizziness.  Echo pending.  If PVCs remain frequent, he will need outpt monitoring to determine burden.  May need ischemic eval.  Cont  blocker and flecainide.  5.  COVID 19 infection:  Pt w/ mildly productive cough  over the past 2 days.  Otw asymptomatic, although unclear if dizziness a manifestation of COVID infection.  Antiviral rx per IM.  Signed, Nicolasa Ducking, NP  12/13/2020, 10:50 AM    For questions or updates, please contact   Please consult [email or web address] for contact info under Cardiology/STEMI.

## 2020-12-13 NOTE — [place] FL2 (Signed)
[place] MEDICAID FL2 LEVEL OF CARE SCREENING TOOL     IDENTIFICATION  Patient Name: Peter Castro Birthdate: 1946-05-02 Sex: male Admission Date (Current Location): 12/10/2020  [place] and [place] Number:  Chiropodist and Address:  [facility], [address], [place], [place] [address]      Provider Number: [id number]  Attending Physician Name and Address:  Meredeth Ide, MD  Relative Name and Phone Number:  Avyn, Co[place])   [phone] [facility])    Current Level of Care: Hospital Recommended Level of Care: Skilled Nursing Facility Prior Approval Number:    Date Approved/Denied:   PASRR Number: [id number]  Discharge Plan: SNF    Current Diagnoses: Patient Active Problem List   Diagnosis Date Noted  . SDH (subdural hematoma) (HCC) 12/10/2020  . Fall 12/10/2020  . Stroke (HCC) 12/10/2020  . GERD (gastroesophageal reflux disease) 12/10/2020  . Dizziness 12/10/2020  . SAH (subarachnoid hemorrhage) (HCC) 12/10/2020  . Aortic atherosclerosis (HCC) 10/14/2020  . Left rib fracture 10/14/2020  . Emphysema lung (HCC) 10/14/2020  . Hiatal hernia 10/14/2020  . Elevated PSA 11/02/2019  . Atrial fibrillation (HCC) 12/08/2018  . Essential hypertension 12/08/2018  . Hyperlipidemia 12/08/2018  . Cerebrovascular accident (CVA) (HCC) 12/08/2018  . Memory loss 12/08/2018    Orientation RESPIRATION BLADDER Height & Weight     Self,Time,Situation,Place  Normal Continent Weight: 194 lb (88 kg) Height:  5\' 5"  (165.1 cm)  BEHAVIORAL SYMPTOMS/MOOD NEUROLOGICAL BOWEL NUTRITION STATUS        Diet (heart diet, thin liquids)  AMBULATORY STATUS COMMUNICATION OF NEEDS Skin   Limited Assist Verbally Normal                       Personal Care Assistance Level of Assistance  Bathing,Feeding,Dressing Bathing Assistance: Limited assistance Feeding assistance: Limited assistance Dressing Assistance: Limited assistance      Functional Limitations Info             SPECIAL CARE FACTORS FREQUENCY  PT (By licensed PT),OT (By licensed OT)     PT Frequency: 5 x/week OT Frequency: 5 x/week            Contractures      Additional Factors Info  Code Status,Allergies Code Status Info: full Allergies Info: nka           Current Medications (12/13/2020):  This is the current hospital active medication list Current Facility-Administered Medications  Medication Dose Route Frequency Provider Last Rate Last Admin  . 0.9 %  sodium chloride infusion   Intravenous Continuous 02/10/2021, MD 10 mL/hr at 12/12/20 0810 Rate Verify at 12/12/20 0810  . acetaminophen (TYLENOL) tablet 650 mg  650 mg Oral Q4H PRN 02/09/21, MD       Or  . acetaminophen (TYLENOL) 160 MG/5ML solution 650 mg  650 mg Per Tube Q4H PRN Lorretta Harp, MD       Or  . acetaminophen (TYLENOL) suppository 650 mg  650 mg Rectal Q4H PRN Lorretta Harp, MD      . amLODipine (NORVASC) tablet 5 mg  5 mg Oral Daily Lorretta Harp, MD   5 mg at 12/13/20 1014  . atorvastatin (LIPITOR) tablet 10 mg  10 mg Oral q1800 02/10/21, MD   10 mg at 12/12/20 1651  . carvedilol (COREG) tablet 25 mg  25 mg Oral BID WC 02/09/21, MD   25 mg at 12/13/20 1014  . flecainide (TAMBOCOR) tablet 100 mg  100  mg Oral BID Lorretta Harp, MD   100 mg at 12/13/20 1013  . hydrALAZINE (APRESOLINE) injection 5 mg  5 mg Intravenous Q2H PRN Lorretta Harp, MD   5 mg at 12/11/20 0650  . lisinopril (ZESTRIL) tablet 10 mg  10 mg Oral Daily Lorretta Harp, MD   10 mg at 12/13/20 1014  . meclizine (ANTIVERT) tablet 25 mg  25 mg Oral TID PRN Lorretta Harp, MD      . ondansetron [facility]) injection 4 mg  4 mg Intravenous Q8H PRN Lorretta Harp, MD      . remdesivir 100 mg in sodium chloride 0.9 % 100 mL IVPB  100 mg Intravenous Daily Dorothea Ogle B, RPH 200 mL/hr at 12/13/20 1014 100 mg at 12/13/20 1014  . senna-docusate (Senokot-S) tablet 1 tablet  1 tablet Oral QHS PRN Lorretta Harp, MD         Discharge  Medications: Please see discharge summary for a list of discharge medications.  Relevant Imaging Results:  Relevant Lab Results:   Additional Information SS #: [id number]  Brenee Gajda E Marrissa Dai, LCSW

## 2020-12-13 NOTE — Progress Notes (Signed)
[facility]  PROGRESS NOTE  Peter Castro URK:[id number] DOB: 07/21/1946 DOA: 12/10/2020 PCP: McLean-Scocuzza, Pasty Spillers, MD   Brief HPI:   75 year old male with history of hypertension, hyperlipidemia, stroke, GERD, atrial fibrillation on Eliquis presented with dizziness and fall.  Patient said that he has been having dizziness for past 2 days denied visual loss or hearing loss.  No numbness or tingling in extremities.  No facial droop or slurred speech.  He came to ED yesterday but left ED before being seen.  He then went to [facility] and fell.  He denied head or neck injury.  He continued to have dizziness.  In the ED, lab work showed positive COVID-19 PCR.  CT head showed SDH and SAH.  Neurosurgery Dr. Adriana Simas was consulted and he recommended to hold Eliquis for 1 week.  Subjective   Patient seen and examined, denies headache. No shortness of breath. Complains of cough.   Assessment/Plan:     1. SDH/SAH-  Dr. Adriana Simas from neurosurgery was consulted, he recommended holding Eliquis for 1 week.  No need to give Kcentra at this time.  CT head was repeated per Dr. Adriana Simas which was done around 5 PM yesterday.  CT head showed stable appearance of acute SDH along the falx right tentorium, not significantly changed from previous CT.  This measures up to 4 mm in thickness along the right tentorium.  Adjacent right occipital subdural hemorrhage with slight increase from earlier.  Continue frequent neurochecks, Eliquis on hold for 1 week. However cardiology feels that Eliquis needs to be held longer than that considering that patient may have had spontaneous bleed while on Eliquis and not from fall. Repeat CT head done on 12/12/2020 showed stable 44mm right parafalcine/tentorial subdural hematoma.  Stable trace right occipital subarachnoid hemorrhage.  Will continue to hold hold Eliquis for 1 week, patient can follow-up with neurosurgery as outpatient. 2. Dizziness-patient presents with dizziness which has  been going on prior to fall.MRI brain showed small volume subdural hemorrhage along the dependent posterior right cerebral convexity, posterior falx and right tentorium.  Adjacent right occipital subarachnoid hemorrhage.  Diminished right sigmoid sinus flow void probably reflects slow flow. MRV was obtained as per radiology recommendation however it did not show any dural sinus thrombosis. Orthostatic vital signs could not be obtained due to dizziness. We will continue to try to obtain orthostatic vital signs every shift. Continue monitoring on telemetry.  Continue as needed meclizine.  Patient takes flecainide and Coreg at home. Cardiology has been consulted for further recommendations regarding these medications. 3. Positive COVID-19 PCR-COVID-19 PCR was positive, patient is asymptomatic, not requiring oxygen.  Does not have other symptoms of COVID-19 infection, no nausea vomiting or diarrhea.  No loss of taste or smell.  We will continue to monitor for  symptoms.  Patient started on remdesivir for 3 days.   He does have history of stroke, hypertension, hyperlipidemia and his age puts him at risk for serious complications from COVID-19 infection. 4. Atrial fibrillation-Eliquis on hold as per neurosurgery for 1 week.  Continue Coreg, flecainide.  Monitor on telemetry. 5. Frequent PVCs/bigeminy-patient continues to have frequent PVCs on telemetry, he is currently on flecainide per cardiology. Cardiology following, might start another antiarrhythmic. Echo has been ordered. We will follow echo results. 6. Hypertension-continue amlodipine Coreg, lisinopril.  Continue IV hydralazine as needed for SBP more than 140. 7. Hyperlipidemia-continue Lipitor.     COVID-19 Labs  No results for input(s): DDIMER, FERRITIN, LDH, CRP in the last 72 hours.  Lab Results  Component Value Date   SARSCOV2NAA POSITIVE (A) 12/10/2020     Scheduled medications:   . amLODipine  5 mg Oral Daily  . atorvastatin  10 mg Oral  q1800  . carvedilol  25 mg Oral BID WC  . flecainide  100 mg Oral BID  . lisinopril  10 mg Oral Daily         CBG: No results for input(s): GLUCAP in the last 168 hours.  SpO2: 95 %    CBC: Recent Labs  Lab 12/09/20 1000 12/10/20 0658  WBC 11.8* 9.5  NEUTROABS  --  7.1  HGB 15.8 15.8  HCT 47.2 47.5  MCV 89.9 90.3  PLT 159 146*    Basic Metabolic Panel: Recent Labs  Lab 12/09/20 1000 12/10/20 0658  NA 137 137  K 4.4 4.0  CL 101 102  CO2 27 25  GLUCOSE 117* 112*  BUN 19 19  CREATININE 1.18 1.06  CALCIUM 8.8* 8.8*     Liver Function Tests: Recent Labs  Lab 12/10/20 0658  AST 25  ALT 22  ALKPHOS 79  BILITOT 1.2  PROT 6.8  ALBUMIN 4.1     Antibiotics: Anti-infectives (From admission, onward)   Start     Dose/Rate Route Frequency Ordered Stop   12/12/20 1000  remdesivir 100 mg in sodium chloride 0.9 % 100 mL IVPB       "Followed by" Linked Group Details   100 mg 200 mL/hr over 30 Minutes Intravenous Daily 12/11/20 1548 12/13/20 1044   12/11/20 1700  remdesivir 200 mg in sodium chloride 0.9% 250 mL IVPB       "Followed by" Linked Group Details   200 mg 580 mL/hr over 30 Minutes Intravenous Once 12/11/20 1548 12/11/20 2228       DVT prophylaxis: SCDs  Code Status: Full code  Family Communication: No family at bedside   Consultants:  Neurosurgery  Procedures:      Objective   Vitals:   12/12/20 2012 12/13/20 0007 12/13/20 0532 12/13/20 0933  BP: 120/65 116/78 (!) 124/92 (!) 142/76  Pulse: 67 62 68 75  Resp: 15 15 16 14   Temp: (!) 97.4 F (36.3 C) 97.6 F (36.4 C) 98.6 F (37 C) 98.9 F (37.2 C)  TempSrc:  Oral Oral   SpO2: 96% 95% 95% 95%  Weight:      Height:        Intake/Output Summary (Last 24 hours) at 12/13/2020 1200 Last data filed at 12/13/2020 0500 Gross per 24 hour  Intake --  Output 400 ml  Net -400 ml    02/02 1901 - 02/04 0700 In: 328.6 [I.V.:78.6] Out: 400 [Urine:400]  Filed Weights   12/10/20  0655  Weight: 88 kg    Physical Examination:  General-appears in no acute distress Heart-S1-S2, regular, no murmur auscultated Lungs-clear to auscultation bilaterally, no wheezing or crackles auscultated Abdomen-soft, nontender, no organomegaly Extremities-no edema in the lower extremities Neuro-alert, oriented x3, no focal deficit noted  Status is: Inpatient  Dispo: The patient is from: Home              Anticipated d/c is to: Home              Anticipated d/c date is: 12/15/2020              Patient currently not stable for discharge  Barrier to discharge-observation for SDH, SAH.  Also work-up for dizziness.        Data Reviewed:  Recent Results (from the past 240 hour(s))  SARS CORONAVIRUS 2 (TAT 6-24 HRS) Nasopharyngeal     Status: Abnormal   Collection Time: 12/10/20 10:52 AM   Specimen: Nasopharyngeal  Result Value Ref Range Status   SARS Coronavirus 2 POSITIVE (A) NEGATIVE Final    Comment: (NOTE) SARS-CoV-2 target nucleic acids are DETECTED.  The SARS-CoV-2 RNA is generally detectable in upper and lower respiratory specimens during the acute phase of infection. Positive results are indicative of the presence of SARS-CoV-2 RNA. Clinical correlation with patient history and other diagnostic information is  necessary to determine patient infection status. Positive results do not rule out bacterial infection or co-infection with other viruses.  The expected result is Negative.  Fact Sheet for Patients: HairSlick.no  Fact Sheet for Healthcare Providers: quierodirigir.com  This test is not yet approved or cleared by the [place] FDA and  has been authorized for detection and/or diagnosis of SARS-CoV-2 by FDA under an Emergency Use Authorization (EUA). This EUA will remain  in effect (meaning this test can be used) for the duration of the COVID-19 declaration under Section 564(b)(1) of the Act, 21 U.  S.C. section 360bbb-3(b)(1), unless the authorization is terminated or revoked sooner.   Performed at [facility], [address] [place], [place] [address]   MRSA PCR Screening     Status: None   Collection Time: 12/12/20  1:00 PM   Specimen: Nasopharyngeal  Result Value Ref Range Status   MRSA by PCR NEGATIVE NEGATIVE Final    Comment:        The GeneXpert MRSA Assay (FDA approved for NASAL specimens only), is one component of a comprehensive MRSA colonization surveillance program. It is not intended to diagnose MRSA infection nor to guide or monitor treatment for MRSA infections. Performed at [facility], [address] [place], [place] [address]      Studies:  MR MRV HEAD W WO CONTRAST  Result Date: 12/11/2020 CLINICAL DATA:  Possible abnormal sigmoid sinus on MRI brain EXAM: MR VENOGRAM HEAD WITHOUT AND WITH CONTRAST TECHNIQUE: Angiographic images of the intracranial venous structures were obtained using MRV technique without and with intravenous contrast. CONTRAST:  7.107mL GADAVIST GADOBUTROL 1 MMOL/ML IV SOLN COMPARISON:  Correlation made with prior MRI brain FINDINGS: Superior sagittal sinus, straight sinus, vein of Galen, internal cerebral veins are patent. Transverse sinuses are patent, dominant on the left. Right larger than left filling defects at the distal transverse sinuses likely reflect arachnoid granulations. Bilateral sigmoid sinuses are patent. IMPRESSION: No evidence of dural sinus thrombosis. Electronically Signed   By: Guadlupe Spanish M.D.   On: 12/11/2020 15:57      Peter Castro S Glessie Eustice   [facility] If 7PM-7AM, please contact night-coverage at [email or web address], Office  [phone]   12/13/2020, 12:00 PM  LOS: 1 day

## 2020-12-13 NOTE — Progress Notes (Signed)
Physical Therapy Treatment Patient Details Name: Peter Castro MRN: [id number] DOB: February 26, 1946 Today's Date: 12/13/2020    History of Present Illness presented to ER secondary to onset of dizziness and fall (posterior) with head injury; admitted for management of 49mm R parafalcine/tentorial SDH and trace R occipital SAH (recommended for conservative management per neurosurgery).  Incidentally, also noted to be covid + during admission.    PT Comments    Acute Concussion Evaluation/Questionnaire: 3/22, positive for headache, dizziness and balance deficits; denies additional physical, cognitive, emotion and sleep issues Additional vestibular examination suggestive of L-sided vestibular involvement; improved with dix-hallpike and epley to treat L.  Dizziness diminished with repetition of task; noted improvement in ease, willingness and overall fluidity of movement post-treatment.   Additional gait deferred post-treatment; will plan to re-treat (as needed) and further assess gait next date.   Follow Up Recommendations  SNF     Equipment Recommendations       Recommendations for Other Services       Precautions / Restrictions Precautions Precautions: Fall Restrictions Weight Bearing Restrictions: No    Mobility  Bed Mobility Overal bed mobility: Needs Assistance Bed Mobility: Supine to Sit     Supine to sit: Supervision     General bed mobility comments: increased effort and use of UEs required for truncal elevation with supine/sit  Transfers Overall transfer level: Needs assistance Equipment used: None Transfers: Sit to/from Stand;Stand Pivot Transfers   Stand pivot transfers: Min guard;Min assist       General transfer comment: improved truncal activation and control  Ambulation/Gait             General Gait Details: deferred this date   Stairs             Wheelchair Mobility    Modified Rankin (Stroke Patients Only)       Balance                                             Cognition Arousal/Alertness: Awake/alert Behavior During Therapy: WFL for tasks assessed/performed Overall Cognitive Status: Within Functional Limits for tasks assessed                                        Exercises Other Exercises Other Exercises: Acute Concussion Evaluation/Questionnaire: 3/22, positive for headache, dizziness and balance deficits; denies additional physical, cognitive, emotion and sleep issues Other Exercises: Additional vestibular examination: roll test in supine, significant dizziness reported with rolling to L; dix-hallpike to L with significant nystagmus and reports of dizziness (8/10).  Transitioned through eply maneuver to address, holding each position 60-90 seconds.  Repeated second time for additional treatment with intensity of dizziness decreased to 5-6/10 during maneuver.  Declined third trial due to fatigue. Other Exercises: Transitioned to chair, sPT without assist device, post treatment, cga.  Improved core activation/stability with transfer this date. Patient declined additional gait trial due to fatigue; will plan to assess further next session.  Do note improved ease and fluidity of head turns and dynamic gait activities post-vestibular treatment.    General Comments        Pertinent Vitals/Pain Pain Assessment: No/denies pain    Home Living  Prior Function            PT Goals (current goals can now be found in the care plan section) Acute Rehab PT Goals Patient Stated Goal: to go home PT Goal Formulation: With patient Time For Goal Achievement: 12/26/20 Potential to Achieve Goals: Good Progress towards PT goals: Progressing toward goals    Frequency    Min 2X/week      PT Plan Current plan remains appropriate    Co-evaluation              AM-PAC PT "6 Clicks" Mobility   Outcome Measure  Help needed turning from your  back to your side while in a flat bed without using bedrails?: None Help needed moving from lying on your back to sitting on the side of a flat bed without using bedrails?: A Little Help needed moving to and from a bed to a chair (including a wheelchair)?: A Little Help needed standing up from a chair using your arms (e.g., wheelchair or bedside chair)?: A Little Help needed to walk in hospital room?: A Little Help needed climbing 3-5 steps with a railing? : A Little 6 Click Score: 19    End of Session   Activity Tolerance: Patient tolerated treatment well Patient left: in chair;with call bell/phone within reach;with chair alarm set Nurse Communication: Mobility status PT Visit Diagnosis: Muscle weakness (generalized) (M62.81);Difficulty in walking, not elsewhere classified (R26.2)     Time: 7681-1572 PT Time Calculation (min) (ACUTE ONLY): 47 min  Charges:  $Therapeutic Activity: 8-22 mins $Canalith Rep Proc: 23-37 mins                    Saranda Legrande H. Manson Passey, PT, DPT, NCS 12/13/20, 5:18 PM [phone]

## 2020-12-13 NOTE — TOC Initial Note (Signed)
Transition of Care [facility]) - Initial/Assessment Note    Patient Details  Name: Peter Castro MRN: [id number] Date of Birth: 12-25-1945  Transition of Care [facility]) CM/SW Contact:    Liliana Cline, LCSW Phone Number: 12/13/2020, 10:29 AM  Clinical Narrative:             Patient on Airborne Precautions. Spoke to patient via phone then called son Peter Castro with patient's permission. Patient lives alone and drives himself to appointments. PCP is Dr. Shirlee Latch. [facility]. No DME, HH, or SNF history. Patient has had his COVID vaccines and booster. Patient and son agreeable to SNF recommendation. Patient was COVID + 12/10/20. They understand CSW will look for COVID SNF bed which are not as local. They prefer [facility] as it is closer to son's home in [place]. CSW started SNF work up. Reached out to [place] at [facility] and asked her to review referral.      Expected Discharge Plan: Skilled Nursing Facility Barriers to Discharge: Continued Medical Work up   Patient Goals and CMS Choice Patient states their goals for this hospitalization and ongoing recovery are:: SNF rehab CMS Medicare.gov Compare Post Acute Care list provided to:: Patient Choice offered to / list presented to : Patient,Adult Children  Expected Discharge Plan and Services Expected Discharge Plan: Skilled Nursing Facility       Living arrangements for the past 2 months: Single Family Home                                      Prior Living Arrangements/Services Living arrangements for the past 2 months: Single Family Home Lives with:: Self Patient language and need for interpreter reviewed:: Yes Do you feel safe going back to the place where you live?: Yes      Need for Family Participation in Patient Care: Yes (Comment) Care giver support system in place?: Yes (comment)   Criminal Activity/Legal Involvement Pertinent to Current Situation/Hospitalization: No - Comment as  needed  Activities of Daily Living      Permission Sought/Granted Permission sought to share information with : Facility Contractor granted to share information with : Yes, Verbal Permission Granted     Permission granted to share info w AGENCY: SNFs  Permission granted to share info w Relationship: son Peter Castro     Emotional Assessment       Orientation: : Oriented to Self,Oriented to Place,Oriented to  Time,Oriented to Situation Alcohol / Substance Use: Not Applicable Psych Involvement: No (comment)  Admission diagnosis:  Dizziness [R42] SDH (subdural hematoma) (HCC) [S06.5X9A] Patient Active Problem List   Diagnosis Date Noted  . SDH (subdural hematoma) (HCC) 12/10/2020  . Fall 12/10/2020  . Stroke (HCC) 12/10/2020  . GERD (gastroesophageal reflux disease) 12/10/2020  . Dizziness 12/10/2020  . SAH (subarachnoid hemorrhage) (HCC) 12/10/2020  . Aortic atherosclerosis (HCC) 10/14/2020  . Left rib fracture 10/14/2020  . Emphysema lung (HCC) 10/14/2020  . Hiatal hernia 10/14/2020  . Elevated PSA 11/02/2019  . Atrial fibrillation (HCC) 12/08/2018  . Essential hypertension 12/08/2018  . Hyperlipidemia 12/08/2018  . Cerebrovascular accident (CVA) (HCC) 12/08/2018  . Memory loss 12/08/2018   PCP:  McLean-Scocuzza, Pasty Spillers, MD Pharmacy:   [facility] [place], [place] - [address] [place] [address] Phone: [phone] Fax: [phone]     Social Determinants of Health (SDOH) Interventions  Readmission Risk Interventions No flowsheet data found.

## 2020-12-14 ENCOUNTER — Inpatient Hospital Stay (HOSPITAL_COMMUNITY)
Admit: 2020-12-14 | Discharge: 2020-12-14 | Disposition: A | Discharge status: Home / Self Care | Payer: Medicare HMO | Attending: Nurse Practitioner | Admitting: Nurse Practitioner

## 2020-12-14 DIAGNOSIS — R42 Dizziness and giddiness: Secondary | ICD-10-CM

## 2020-12-14 DIAGNOSIS — I480 Paroxysmal atrial fibrillation: Secondary | ICD-10-CM | POA: Diagnosis not present

## 2020-12-14 DIAGNOSIS — W19XXXA Unspecified fall, initial encounter: Secondary | ICD-10-CM | POA: Diagnosis not present

## 2020-12-14 DIAGNOSIS — S065X9A Traumatic subdural hemorrhage with loss of consciousness of unspecified duration, initial encounter: Secondary | ICD-10-CM | POA: Diagnosis not present

## 2020-12-14 DIAGNOSIS — R55 Syncope and collapse: Secondary | ICD-10-CM

## 2020-12-14 DIAGNOSIS — I10 Essential (primary) hypertension: Secondary | ICD-10-CM | POA: Diagnosis not present

## 2020-12-14 DIAGNOSIS — I609 Nontraumatic subarachnoid hemorrhage, unspecified: Secondary | ICD-10-CM

## 2020-12-14 DIAGNOSIS — E785 Hyperlipidemia, unspecified: Secondary | ICD-10-CM | POA: Diagnosis not present

## 2020-12-14 DIAGNOSIS — U071 COVID-19: Secondary | ICD-10-CM

## 2020-12-14 LAB — ECHOCARDIOGRAM COMPLETE
AR max vel: 1.79 cm2
AV Peak grad: 6.4 mmHg
Ao pk vel: 1.26 m/s
Area-P 1/2: 3.1 cm2
Height: 65 in
S' Lateral: 3.72 cm
Weight: 3104 oz

## 2020-12-14 NOTE — Progress Notes (Signed)
[facility]  PROGRESS NOTE  Peter Castro TJQ:[id number] DOB: [birth year] DOA: 12/10/2020 PCP: McLean-Scocuzza, Pasty Spillers, MD   Brief HPI:   75 year old male with history of hypertension, hyperlipidemia, stroke, GERD, atrial fibrillation on Eliquis presented with dizziness and fall.  Patient said that he has been having dizziness for past 2 days denied visual loss or hearing loss.  No numbness or tingling in extremities.  No facial droop or slurred speech.  He came to ED yesterday but left ED before being seen.  He then went to [facility] and fell.  He denied head or neck injury.  He continued to have dizziness.  In the ED, lab work showed positive COVID-19 PCR.  CT head showed SDH and SAH.  Neurosurgery Dr. Adriana Simas was consulted and he recommended to hold Eliquis for 1 week.  Subjective   Patient seen and examined, feels better this morning.  He had vestibular PT done yesterday, and he feels better after that.   Assessment/Plan:     1. SDH/SAH-  Dr. Adriana Simas from neurosurgery was consulted, he recommended holding Eliquis for 1 week.  No need to give Kcentra at this time.  CT head was repeated per Dr. Adriana Simas which was done around 5 PM yesterday.  CT head showed stable appearance of acute SDH along the falx right tentorium, not significantly changed from previous CT.  This measures up to 4 mm in thickness along the right tentorium.  Adjacent right occipital subdural hemorrhage with slight increase from earlier.  Continue frequent neurochecks, Eliquis on hold for 1 week. However cardiology feels that Eliquis needs to be held longer than that considering that patient may have had spontaneous bleed while on Eliquis and not from fall. Repeat CT head done on 12/12/2020 showed stable 73mm right parafalcine/tentorial subdural hematoma.  Stable trace right occipital subarachnoid hemorrhage.  Will continue to hold hold Eliquis for 1 week, patient can follow-up with neurosurgery as outpatient. 2. Dizziness/?  BPPV  patient presents with dizziness which has been going on prior to fall.MRI brain showed small volume subdural hemorrhage along the dependent posterior right cerebral convexity, posterior falx and right tentorium.  Adjacent right occipital subarachnoid hemorrhage.  Diminished right sigmoid sinus flow void probably reflects slow flow. MRV was obtained as per radiology recommendation however it did not show any dural sinus thrombosis. Orthostatic vital signs could not be obtained due to dizziness. We will continue to try to obtain orthostatic vital signs every shift. Continue monitoring on telemetry.  Continue as needed meclizine.  Patient takes flecainide and Coreg at home. Cardiology is following for medication readjustment which could cause dizziness.  Also patient has improved with dix-hallpike and epley maneuvers done by physical therapy. 3. Positive COVID-19 PCR-COVID-19 PCR was positive, patient is asymptomatic, not requiring oxygen.  Does not have other symptoms of COVID-19 infection, no nausea vomiting or diarrhea.  No loss of taste or smell.  We will continue to monitor for  symptoms.  Patient started on remdesivir for 3 days.   He does have history of stroke, hypertension, hyperlipidemia and his age puts him at risk for serious complications from COVID-19 infection. 4. Atrial fibrillation-Eliquis on hold as per neurosurgery for 1 week.  Continue Coreg, flecainide.  Monitor on telemetry. 5. Frequent PVCs/bigeminy-patient continues to have frequent PVCs on telemetry, he is currently on flecainide per cardiology. Cardiology following, might start another antiarrhythmic. Echo has been ordered. We will follow echo results. 6. Hypertension-continue amlodipine Coreg, lisinopril.  Continue IV hydralazine as needed for SBP more  than 140. 7. Hyperlipidemia-continue Lipitor.     COVID-19 Labs  No results for input(s): DDIMER, FERRITIN, LDH, CRP in the last 72 hours.  Lab Results  Component Value Date    SARSCOV2NAA POSITIVE (A) 12/10/2020     Scheduled medications:   . amLODipine  5 mg Oral Daily  . atorvastatin  10 mg Oral q1800  . carvedilol  25 mg Oral BID WC  . flecainide  100 mg Oral BID  . lisinopril  10 mg Oral Daily         CBG: No results for input(s): GLUCAP in the last 168 hours.  SpO2: 98 %    CBC: Recent Labs  Lab 12/09/20 1000 12/10/20 0658  WBC 11.8* 9.5  NEUTROABS  --  7.1  HGB 15.8 15.8  HCT 47.2 47.5  MCV 89.9 90.3  PLT 159 146*    Basic Metabolic Panel: Recent Labs  Lab 12/09/20 1000 12/10/20 0658  NA 137 137  K 4.4 4.0  CL 101 102  CO2 27 25  GLUCOSE 117* 112*  BUN 19 19  CREATININE 1.18 1.06  CALCIUM 8.8* 8.8*     Liver Function Tests: Recent Labs  Lab 12/10/20 0658  AST 25  ALT 22  ALKPHOS 79  BILITOT 1.2  PROT 6.8  ALBUMIN 4.1     Antibiotics: Anti-infectives (From admission, onward)   Start     Dose/Rate Route Frequency Ordered Stop   12/12/20 1000  remdesivir 100 mg in sodium chloride 0.9 % 100 mL IVPB       "Followed by" Linked Group Details   100 mg 200 mL/hr over 30 Minutes Intravenous Daily 12/11/20 1548 12/13/20 1754   12/11/20 1700  remdesivir 200 mg in sodium chloride 0.9% 250 mL IVPB       "Followed by" Linked Group Details   200 mg 580 mL/hr over 30 Minutes Intravenous Once 12/11/20 1548 12/11/20 2228       DVT prophylaxis: SCDs  Code Status: Full code  Family Communication: No family at bedside   Consultants:  Neurosurgery  Procedures:      Objective   Vitals:   12/13/20 2018 12/14/20 0303 12/14/20 0756 12/14/20 1132  BP: 105/65 105/66 126/84 114/77  Pulse: 70 63 67 72  Resp: 17 (!) 22 16 16   Temp: 97.8 F (36.6 C) 98.2 F (36.8 C) 98.2 F (36.8 C) 98.1 F (36.7 C)  TempSrc: Oral Oral Oral   SpO2: 94% 95% 93% 98%  Weight:      Height:        Intake/Output Summary (Last 24 hours) at 12/14/2020 1307 Last data filed at 12/14/2020 0309 Gross per 24 hour  Intake 0 ml   Output 300 ml  Net -300 ml    02/03 1901 - 02/05 0700 In: 0  Out: 700 [Urine:700]  Filed Weights   12/10/20 0655  Weight: 88 kg    Physical Examination:  General-appears in no acute distress Heart-S1-S2, regular, no murmur auscultated Lungs-clear to auscultation bilaterally, no wheezing or crackles auscultated Abdomen-soft, nontender, no organomegaly Extremities-no edema in the lower extremities Neuro-alert, oriented x3, no focal deficit noted  Status is: Inpatient  Dispo: The patient is from: Home              Anticipated d/c is to: Home              Anticipated d/c date is: 12/15/2020              Patient currently  not stable for discharge  Barrier to discharge-observation for SDH, SAH.  Also work-up for dizziness.        Data Reviewed:   Recent Results (from the past 240 hour(s))  SARS CORONAVIRUS 2 (TAT 6-24 HRS) Nasopharyngeal     Status: Abnormal   Collection Time: 12/10/20 10:52 AM   Specimen: Nasopharyngeal  Result Value Ref Range Status   SARS Coronavirus 2 POSITIVE (A) NEGATIVE Final    Comment: (NOTE) SARS-CoV-2 target nucleic acids are DETECTED.  The SARS-CoV-2 RNA is generally detectable in upper and lower respiratory specimens during the acute phase of infection. Positive results are indicative of the presence of SARS-CoV-2 RNA. Clinical correlation with patient history and other diagnostic information is  necessary to determine patient infection status. Positive results do not rule out bacterial infection or co-infection with other viruses.  The expected result is Negative.  Fact Sheet for Patients: HairSlick.no  Fact Sheet for Healthcare Providers: quierodirigir.com  This test is not yet approved or cleared by the [place] FDA and  has been authorized for detection and/or diagnosis of SARS-CoV-2 by FDA under an Emergency Use Authorization (EUA). This EUA will remain  in effect  (meaning this test can be used) for the duration of the COVID-19 declaration under Section 564(b)(1) of the Act, 21 U. S.C. section 360bbb-3(b)(1), unless the authorization is terminated or revoked sooner.   Performed at [facility], [address] [place], [place] [address]   MRSA PCR Screening     Status: None   Collection Time: 12/12/20  1:00 PM   Specimen: Nasopharyngeal  Result Value Ref Range Status   MRSA by PCR NEGATIVE NEGATIVE Final    Comment:        The GeneXpert MRSA Assay (FDA approved for NASAL specimens only), is one component of a comprehensive MRSA colonization surveillance program. It is not intended to diagnose MRSA infection nor to guide or monitor treatment for MRSA infections. Performed at [facility], [address] [place], [place] [address]      Studies:  No results found.    Meredeth Ide   [facility] If 7PM-7AM, please contact night-coverage at [email or web address], Office  [phone]   12/14/2020, 1:07 PM  LOS: 2 days

## 2020-12-14 NOTE — Progress Notes (Signed)
*  PRELIMINARY RESULTS* Echocardiogram 2D Echocardiogram has been performed.  Peter Castro Peter Castro 12/14/2020, 1:46 PM

## 2020-12-14 NOTE — Progress Notes (Signed)
Occupational Therapy Treatment Patient Details Name: Shmiel Morton MRN: [id number] DOB: May 11, 1946 Today's Date: 12/14/2020    History of present illness 75 y/o male presented to ER secondary to onset of dizziness and fall (posterior) with head injury; admitted for management of 21mm R parafalcine/tentorial SDH and trace R occipital SAH (recommended for conservative management per neurosurgery).  Incidentally, also noted to be covid + during admission.   OT comments  Upon entering the room, pt supine in bed with no c/o pain this session. Pt agreeable to OT intervention. Pt standing with supervision without use of AD and standing at sink for grooming tasks with no c/o dizziness. Pt then ambulating 200' at supervision level before returning back to room for toileting with supervision for safety for transfer and clothing management. Pt returning to bed and fixing covers while standing without LOB. OT discussing recommendation change to [facility] with pt reporting his son can intermittently assist him as needed. Another staff person arrived at end of session and therapist transitioned out of room. All needs within reach.   Follow Up Recommendations  Home health OT;Supervision - Intermittent    Equipment Recommendations  Other (comment) (defer to next venue of care)       Precautions / Restrictions Precautions Precautions: Fall Restrictions Weight Bearing Restrictions: No       Mobility Bed Mobility Overal bed mobility: Independent Bed Mobility: Supine to Sit     Supine to sit: Supervision Sit to supine: Supervision   General bed mobility comments: able to get himself to sitting EOB with ease this date  Transfers Overall transfer level: Needs assistance Equipment used: None Transfers: Sit to/from Stand Sit to Stand: Supervision Stand pivot transfers: Supervision       General transfer comment: Pt able to rise w/o assist and with only minimal reliance on UEs    Balance  Overall balance assessment: Needs assistance Sitting-balance support: No upper extremity supported;Feet supported Sitting balance-Leahy Scale: Good Sitting balance - Comments: no LOB   Standing balance support: Single extremity supported Standing balance-Leahy Scale: Good                             ADL either performed or assessed with clinical judgement   ADL Overall ADL's : Needs assistance/impaired     Grooming: Wash/dry hands;Wash/dry face;Supervision/safety;Standing;Oral care                   Toilet Transfer: Supervision/safety;Regular Social worker and Hygiene: Supervision/safety;Sit to/from stand               Vision Patient Visual Report: No change from baseline            Cognition Arousal/Alertness: Awake/alert Behavior During Therapy: WFL for tasks assessed/performed Overall Cognitive Status: Within Functional Limits for tasks assessed                                 General Comments: cooperative and pleasant              General Comments Pt reports he has not had dizziness since yesterday, (-) L Dix-Hallpike this date.    Pertinent Vitals/ Pain       Pain Assessment: No/denies pain         Frequency  Min 2X/week        Progress Toward Goals  OT Goals(current goals can now be  found in the care plan section)  Progress towards OT goals: Progressing toward goals  Acute Rehab OT Goals Patient Stated Goal: to go home OT Goal Formulation: With patient Time For Goal Achievement: 12/26/20 Potential to Achieve Goals: Good  Plan Discharge plan needs to be updated       AM-PAC OT "6 Clicks" Daily Activity     Outcome Measure   Help from another person eating meals?: None Help from another person taking care of personal grooming?: None Help from another person toileting, which includes using toliet, bedpan, or urinal?: None Help from another person bathing (including washing,  rinsing, drying)?: None Help from another person to put on and taking off regular upper body clothing?: None Help from another person to put on and taking off regular lower body clothing?: None 6 Click Score: 24    End of Session    OT Visit Diagnosis: Unsteadiness on feet (R26.81);Repeated falls (R29.6);Muscle weakness (generalized) (M62.81)   Activity Tolerance Patient tolerated treatment well   Patient Left in bed;with call bell/phone within reach;Other (comment)   Nurse Communication Mobility status        Time: 1100-1126 OT Time Calculation (min): 26 min  Charges: OT General Charges $OT Visit: 1 Visit OT Treatments $Self Care/Home Management : 8-22 mins $Therapeutic Activity: 8-22 mins  Jackquline Denmark, MS, OTR/L , CBIS ascom [phone]  12/14/20, 12:35 PM

## 2020-12-14 NOTE — Progress Notes (Signed)
Physical Therapy Treatment Patient Details Name: Peter Castro MRN: [id number] DOB: 19-Feb-1946 Today's Date: 12/14/2020    History of Present Illness 75 y/o male presented to ER secondary to onset of dizziness and fall (posterior) with head injury; admitted for management of 37mm R parafalcine/tentorial SDH and trace R occipital SAH (recommended for conservative management per neurosurgery).  Incidentally, also noted to be covid + during admission.    PT Comments    Pt did well with PT session today.  He reports he has not had dizziness since yesterday and had no vertigo or lightheadedness t/o the session with position changes, head turns, tranfers and was (-) for symptoms for L DixHallpike Maneuver (pt preferred not to pursue further vestibular follow up).  Pt's vitals were great pre and post activity on room air with no c/o fatigue or other issues.  Pt reports his son would be able to check on him a few times a week and is feeling much better about going home.  He would still benefit from continued PT at home to get back to baseline activity level and continue to follow up with vestibular status.  Follow Up Recommendations  Home health PT;Supervision - Intermittent     Equipment Recommendations   (Pt feels that he has his wife's old walker somewhere)    Recommendations for Other Services       Precautions / Restrictions Precautions Precautions: Fall Restrictions Weight Bearing Restrictions: No    Mobility  Bed Mobility Overal bed mobility: Independent Bed Mobility: Supine to Sit     Supine to sit: Supervision Sit to supine: Supervision   General bed mobility comments: able to get himself to sitting EOB with ease this date  Transfers Overall transfer level: Modified independent Equipment used: None Transfers: Sit to/from Stand Sit to Stand: Supervision         General transfer comment: Pt able to rise w/o assist and with only minimal reliance on  UEs  Ambulation/Gait Ambulation/Gait assistance: Supervision Gait Distance (Feet): 200 Feet Assistive device: Rolling walker (2 wheeled);None       General Gait Details: first ~40 ft with FWW, then no AD or UE support.  Pt able to maintain consistent cadence with no LOBs, showed good confidence, vitals remained stable and appropriate w/o c/o fatigue, dizziness, vertigo, etc   Stairs             Wheelchair Mobility    Modified Rankin (Stroke Patients Only)       Balance Overall balance assessment: Modified Independent   Sitting balance-Leahy Scale: Good       Standing balance-Leahy Scale: Good                              Cognition Arousal/Alertness: Awake/alert Behavior During Therapy: WFL for tasks assessed/performed Overall Cognitive Status: Within Functional Limits for tasks assessed                                        Exercises      General Comments General comments (skin integrity, edema, etc.): Pt reports he has not had dizziness since yesterday, (-) L Dix-Hallpike this date.      Pertinent Vitals/Pain Pain Assessment: No/denies pain    Home Living  Prior Function            PT Goals (current goals can now be found in the care plan section) Progress towards PT goals: Progressing toward goals    Frequency    Min 2X/week      PT Plan Discharge plan needs to be updated    Co-evaluation              AM-PAC PT "6 Clicks" Mobility   Outcome Measure  Help needed turning from your back to your side while in a flat bed without using bedrails?: None Help needed moving from lying on your back to sitting on the side of a flat bed without using bedrails?: None Help needed moving to and from a bed to a chair (including a wheelchair)?: A Little Help needed standing up from a chair using your arms (e.g., wheelchair or bedside chair)?: None Help needed to walk in hospital room?: A  Little Help needed climbing 3-5 steps with a railing? : A Little 6 Click Score: 21    End of Session   Activity Tolerance: Patient tolerated treatment well Patient left: in chair;with call bell/phone within reach;with chair alarm set Nurse Communication: Mobility status PT Visit Diagnosis: Muscle weakness (generalized) (M62.81);Difficulty in walking, not elsewhere classified (R26.2)     Time: 9381-0175 PT Time Calculation (min) (ACUTE ONLY): 29 min  Charges:  $Gait Training: 8-22 mins $Therapeutic Activity: 8-22 mins                     Malachi Pro, DPT 12/14/2020, 9:56 AM

## 2020-12-14 NOTE — Progress Notes (Signed)
Progress Note  Patient Name: Peter Castro Date of Encounter: 12/14/2020  Primary Cardiologist: Julien Nordmann, MD  Subjective   Beginning to feel much better.  Dizziness much improved but not resolved.  Worked w/ PT yesterday w/ exam suggestive of L sided vestibular involvement  improved w/ dix-hallpike and epley.  Denies c/p or dyspnea.  Mild nagging cough.  Inpatient Medications    Scheduled Meds: . amLODipine  5 mg Oral Daily  . atorvastatin  10 mg Oral q1800  . carvedilol  25 mg Oral BID WC  . flecainide  100 mg Oral BID  . lisinopril  10 mg Oral Daily   Continuous Infusions: . sodium chloride 10 mL/hr at 12/12/20 0810   PRN Meds: acetaminophen **OR** acetaminophen (TYLENOL) oral liquid 160 mg/5 mL **OR** acetaminophen, hydrALAZINE, meclizine, ondansetron (ZOFRAN) IV, senna-docusate   Vital Signs    Vitals:   12/13/20 2018 12/14/20 0303 12/14/20 0756 12/14/20 1132  BP: 105/65 105/66 126/84 114/77  Pulse: 70 63 67 72  Resp: 17 (!) 22 16 16   Temp: 97.8 F (36.6 C) 98.2 F (36.8 C) 98.2 F (36.8 C) 98.1 F (36.7 C)  TempSrc: Oral Oral Oral   SpO2: 94% 95% 93% 98%  Weight:      Height:        Intake/Output Summary (Last 24 hours) at 12/14/2020 1256 Last data filed at 12/14/2020 0309 Gross per 24 hour  Intake 0 ml  Output 300 ml  Net -300 ml   Filed Weights   12/10/20 0655  Weight: 88 kg    Physical Exam   GEN: Well nourished, well developed, in no acute distress.  HEENT: Grossly normal.  Neck: Supple, no JVD, carotid bruits, or masses. Cardiac: RRR, no murmurs, rubs, or gallops. No clubbing, cyanosis, edema.  Radials 2+, DP/PT 2+ and equal bilaterally.  Respiratory:  Respirations regular and unlabored, clear to auscultation bilaterally. GI: Soft, nontender, nondistended, BS + x 4. MS: no deformity or atrophy. Skin: warm and dry, no rash. Neuro:  Strength and sensation are intact. Psych: AAOx3.  Normal affect.  Labs    Chemistry Recent Labs   Lab 12/09/20 1000 12/10/20 0658  NA 137 137  K 4.4 4.0  CL 101 102  CO2 27 25  GLUCOSE 117* 112*  BUN 19 19  CREATININE 1.18 1.06  CALCIUM 8.8* 8.8*  PROT  --  6.8  ALBUMIN  --  4.1  AST  --  25  ALT  --  22  ALKPHOS  --  79  BILITOT  --  1.2  GFRNONAA >60 >60  ANIONGAP 9 10     Hematology Recent Labs  Lab 12/09/20 1000 12/10/20 0658  WBC 11.8* 9.5  RBC 5.25 5.26  HGB 15.8 15.8  HCT 47.2 47.5  MCV 89.9 90.3  MCH 30.1 30.0  MCHC 33.5 33.3  RDW 14.1 14.5  PLT 159 146*    Cardiac Enzymes  Recent Labs  Lab 12/09/20 1000 12/09/20 1453 12/10/20 0658  TROPONINIHS 5 4 8       Lipids  Lab Results  Component Value Date   CHOL 153 10/11/2020   HDL 56.10 10/11/2020   LDLCALC 84 10/11/2020   TRIG 61.0 10/11/2020   CHOLHDL 3 10/11/2020    Radiology    CT HEAD WO CONTRAST  Result Date: 12/11/2020 CLINICAL DATA:  Subdural hematoma follow-up EXAM: CT HEAD WITHOUT CONTRAST TECHNIQUE: Contiguous axial images were obtained from the base of the skull through the vertex without intravenous contrast.  COMPARISON:  MR head 12/10/2020, FINDINGS: Brain: Redemonstration of prior left frontal infarction/encephalomalacia. No evidence of large-territorial acute infarction. No parenchymal hemorrhage. Stable right parietooccipital hyperdensity within calcification consistent with cavernous malformation. Stable thin of fall seen subdural hematoma extending along the right tentorium measuring up to 4 mm. Hyperdensity along the right occipital sulci consistent with known trace subarachnoid hemorrhage that is better evaluated on MR head 12/10/2020 (2:20). No mass effect or midline shift. No hydrocephalus. Basilar cisterns are patent. Vascular: No hyperdense vessel. Skull: No acute fracture or focal lesion. Sinuses/Orbits: Paranasal sinuses and mastoid air cells are clear. The orbits are unremarkable. Other: None. IMPRESSION: 1. Stable 4 mm right parafalcine/tentorial subdural hematoma. 2.  Stable trace right occipital subarachnoid hemorrhage. 3. No new intracranial abnormality. Electronically Signed   By: Tish Frederickson M.D.   On: 12/11/2020 06:24   CT Head Wo Contrast  Result Date: 12/10/2020 CLINICAL DATA:  Follow-up subdural hematoma. EXAM: CT HEAD WITHOUT CONTRAST TECHNIQUE: Contiguous axial images were obtained from the base of the skull through the vertex without intravenous contrast. COMPARISON:  Head CT earlier today at 1008 hour FINDINGS: Brain: Acute subdural hematoma along the falx and right tentorium, not significantly changed from earlier today. This measures up to 4 mm in thickness along the right tentorium, measured on series 4, image 46. Adjacent right occipital subarachnoid hemorrhage with slight increase from earlier today. No new hemorrhage. High density nodule in the right frontoparietal white matter measuring 1 cm with internal calcification, unchanged. No surrounding edema. Remote left MCA infarct. No evidence of acute ischemia. No hydrocephalus. No midline shift. Vascular: Atherosclerosis of skullbase vasculature without hyperdense vessel or abnormal calcification. Skull: No fracture or focal lesion. Sinuses/Orbits: Paranasal sinuses and mastoid air cells are clear. The visualized orbits are unremarkable. Other: None. IMPRESSION: 1. Stable appearance of acute subdural hematoma along the falx and right tentorium, not significantly changed from earlier today. This measures up to 4 mm in thickness along the right tentorium. 2. Adjacent right occipital subarachnoid hemorrhage with slight increase from earlier today. 3. Otherwise stable exam.  Remote left MCA infarct. Electronically Signed   By: Narda Rutherford M.D.   On: 12/10/2020 17:19   MR BRAIN WO CONTRAST  Result Date: 12/10/2020 CLINICAL DATA:  Dizziness, subdural hematoma EXAM: MRI HEAD WITHOUT CONTRAST TECHNIQUE: Multiplanar, multiecho pulse sequences of the brain and surrounding structures were obtained without  intravenous contrast. COMPARISON:  Question made with prior CT imaging FINDINGS: Brain: There is no acute infarction. Subdural hemorrhage is present along the dependent posterior right cerebral convexity, posterior falx, and right tentorium. There is right occipital sulcal T2 FLAIR hyperintensity reflecting subarachnoid hemorrhage. Chronic left frontal infarct with encephalomalacia and gliosis and chronic blood products. Heterogeneous 8 mm lesion in the parasagittal right parietal lobe with corresponding susceptibility is most consistent with a cavernous malformation. Additional susceptibility hypointensity in peripheral right temporal lobe likely reflects chronic blood products. Additional patchy T2 hyperintensity in the supratentorial white matter is nonspecific but probably reflects chronic microvascular ischemic changes. Vascular: Diminished right sigmoid sinus flow void probably reflects slow flow. No corresponding abnormal T1 hyperintensity. Major arterial flow voids at the skull base are preserved. Skull and upper cervical spine: Normal marrow signal is preserved. Sinuses/Orbits: Paranasal sinuses are aerated. Orbits are unremarkable. Other: Sella is unremarkable.  Mastoid air cells are clear. IMPRESSION: Small volume subdural hemorrhage along the dependent posterior right cerebral convexity, posterior falx, and right tentorium. Adjacent right occipital subarachnoid hemorrhage. Diminished right sigmoid sinus flow void probably  reflects slow flow. CTV/MRV could be considered for confirmation. 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 Chronic microvascular ischemic changes. Electronically Signed   By: Guadlupe Spanish M.D.   On: 12/10/2020 21:14   MR MRV HEAD W WO CONTRAST  Result Date: 12/11/2020 CLINICAL DATA:  Possible abnormal sigmoid sinus on MRI brain EXAM: MR VENOGRAM HEAD WITHOUT AND WITH CONTRAST TECHNIQUE: Angiographic images of the intracranial venous  structures were obtained using MRV technique without and with intravenous contrast. CONTRAST:  7.53mL GADAVIST GADOBUTROL 1 MMOL/ML IV SOLN COMPARISON:  Correlation made with prior MRI brain FINDINGS: Superior sagittal sinus, straight sinus, vein of Galen, internal cerebral veins are patent. Transverse sinuses are patent, dominant on the left. Right larger than left filling defects at the distal transverse sinuses likely reflect arachnoid granulations. Bilateral sigmoid sinuses are patent. IMPRESSION: No evidence of dural sinus thrombosis. Electronically Signed   By: Guadlupe Spanish M.D.   On: 12/11/2020 15:57    Telemetry    RSR, freq PVC's but again, less than yesterday - Personally Reviewed  Cardiac Studies   2D Echocardiogram pending this AM  Patient Profile     75 y.o. male with a history of PAF s/p catheter ablation in [place], HTN, HL, CVA, memory loss, and remote tob abuse, who was admitted 2/1 w/ dizziness  fall  58mm right parafalcine/tentorial subdural hematomaand trace right occipital subarachnoid hemorrhage. Eliquis on hold.  Assessment & Plan    1.  Dizziness/Fall/Vertigo:  Sudden development of dizziness on 1/31 while walking @ [employer]  fell and struck head.  Very unsteady in ED. Head CT w/ R parafalcine/tentorial SDH and trace R occipital SAH, which has been stable on serial imaging.  Sene by PT yesterday w/ exam suggestive of L sided vestibular involvement  improved w/ dix-hallpike and epley.  Feels much better this AM.  Eliquis remains on hold per [facility] recs.    2.  SDH/SAH:   59mm right parafalcine/tentorial subdural hematomaand stable trace right occipital subarachnoid hemorrhage in setting of above.  Stable on serial CTs and MRI.  Eliquis on hold per [facility].  Rec continuing to hold at least until he is steadier on his feet - remain concerned about possibility of spontaneous bleed in absence of scalp hematoma/superficial trauma.  3.  PAF/Flutter: s/p RFCA/PVI.  Maintaining sinus on  flecainide and  blocker.  Echo still pending.  If EF down, will need further ischemic eval (HsTrop nl) and alternate antiarrhythmic.  Eliquis on hold.  4.  Freq PVCs: freq bigeminy/PVCs w/ rare couplets and triplets while in ED.  Over the past 2 days frequency of PVCs has dropped dramatically, though still w/ occasional PVCs.  He is asymptomatic and as such, it does not appear that these are contributing to his dizziness.  Echo pending.  As above, will need alternate antiarrhythmic if EF down, along w/ ischemic eval.  May benefit from 48 hr holter as outpt to quantify PVCs - can re-eval as outpt.  Cont  blocker.  5.  COVID 19 infection: Pt w/ mildly productive cough over past 3 days.  Otw asymptomatic, though unclear if vertigo is a manifestation of his COVID infection.  He will receive third dose of remdesivir today.  Signed, Nicolasa Ducking, NP  12/14/2020, 12:56 PM    For questions or updates, please contact   Please consult [email or web address] for contact info under Cardiology/STEMI.

## 2020-12-15 DIAGNOSIS — I609 Nontraumatic subarachnoid hemorrhage, unspecified: Secondary | ICD-10-CM | POA: Diagnosis not present

## 2020-12-15 DIAGNOSIS — I10 Essential (primary) hypertension: Secondary | ICD-10-CM | POA: Diagnosis not present

## 2020-12-15 DIAGNOSIS — I480 Paroxysmal atrial fibrillation: Secondary | ICD-10-CM | POA: Diagnosis not present

## 2020-12-15 DIAGNOSIS — I639 Cerebral infarction, unspecified: Secondary | ICD-10-CM

## 2020-12-15 DIAGNOSIS — S065X9A Traumatic subdural hemorrhage with loss of consciousness of unspecified duration, initial encounter: Secondary | ICD-10-CM | POA: Diagnosis not present

## 2020-12-15 MED ORDER — MECLIZINE HCL 25 MG PO TABS: 25.0000 mg | ORAL_TABLET | Freq: Three times a day (TID) | ORAL | 0 refills | Status: DC | PRN

## 2020-12-15 NOTE — Discharge Instructions (Signed)
Continue home isolation till 12/20/2020  COVID-19 Quarantine vs. Isolation QUARANTINE keeps someone who was in close contact with someone who has COVID-19 away from others. Quarantine if you have been in close contact with someone who has COVID-19, unless you have been fully vaccinated. If you are fully vaccinated  You do NOT need to quarantine unless they have symptoms  Get tested 3-5 days after your exposure, even if you don't have symptoms  Wear a mask indoors in public for 14 days following exposure or until your test result is negative If you are not fully vaccinated  Stay home for 14 days after your last contact with a person who has COVID-19  Watch for fever (100.43F), cough, shortness of breath, or other symptoms of COVID-19  If possible, stay away from people you live with, especially people who are at higher risk for getting very sick from COVID-19  Contact your local public health department for options in your area to possibly shorten your quarantine ISOLATION keeps someone who is sick or tested positive for COVID-19 without symptoms away from others, even in their own home. People who are in isolation should stay home and stay in a specific "sick room" or area and use a separate bathroom (if available). If you are sick and think or know you have COVID-19 Stay home until after  At least 10 days since symptoms first appeared and  At least 24 hours with no fever without the use of fever-reducing medications and  Symptoms have improved If you tested positive for COVID-19 but do not have symptoms  Stay home until after 10 days have passed since your positive viral test  If you develop symptoms after testing positive, follow the steps above for those who are sick [email or web address] 08/05/2020 This information is not intended to replace advice given to you by your health care provider. Make sure you discuss any questions you have with your health care provider. Document  Revised: 09/09/2020 Document Reviewed: 09/09/2020 Elsevier Patient Education  2021 [employer].

## 2020-12-15 NOTE — Discharge Summary (Signed)
Physician Discharge Summary  Peter Castro ZOX:[id number] DOB: 10-02-1946 DOA: 12/10/2020  PCP: McLean-Scocuzza, Pasty Spillers, MD  Admit date: 12/10/2020 Discharge date: 12/15/2020  Time spent: 50* minutes  Recommendations for Outpatient Follow-up:  1. Follow-up cardiology in 1 week 2. Follow-up PCP in 2 weeks 3. Stop taking Eliquis till seen by cardiology as outpatient  Discharge Diagnoses:  Principal Problem:   SDH (subdural hematoma) (HCC) Active Problems:   Atrial fibrillation (HCC)   Essential hypertension   Hyperlipidemia   Fall   Stroke [facility])   Dizziness   SAH (subarachnoid hemorrhage) (HCC)   Discharge Condition: Stable  Diet recommendation: Heart healthy diet  Filed Weights   12/10/20 0655  Weight: 88 kg    History of present illness:  75 year old male with history of hypertension, hyperlipidemia, stroke, GERD, atrial fibrillation on Eliquis presented with dizziness and fall.  Patient said that he has been having dizziness for past 2 days denied visual loss or hearing loss.  No numbness or tingling in extremities.  No facial droop or slurred speech.  He came to ED yesterday but left ED before being seen.  He then went to [facility] and fell.  He denied head or neck injury.  He continued to have dizziness.  In the ED, lab work showed positive COVID-19 PCR.  CT head showed SDH and SAH.  Neurosurgery Dr. Adriana Simas was consulted and he recommended to hold Eliquis for 1 week.   Hospital Course:  1. SDH/SAH-  Dr. Adriana Simas from neurosurgery was consulted, he recommended holding Eliquis for 1 week.  No need to give Kcentra at this time.  CT head was repeated per Dr. Adriana Simas which was done around 5 PM yesterday.  CT head showed stable appearance of acute SDH along the falx right tentorium, not significantly changed from previous CT.  This measures up to 4 mm in thickness along the right tentorium.  Adjacent right occipital subdural hemorrhage with slight increase from earlier.   Eliquis on  hold for 1 week. However cardiology feels that Eliquis needs to be held longer than that considering that patient may have had spontaneous bleed while on Eliquis and not from fall. Repeat CT head done on 12/12/2020 showed stable 18mm right parafalcine/tentorial subdural hematoma.  Stable trace right occipital subarachnoid hemorrhage.  Will continue to hold hold Eliquis till patient is seen by cardiology as outpatient.  Also follow-up with neurosurgery in 2 weeks as outpatient.    1. Dizziness-improved patient presents with dizziness which has been going on prior to fall.MRI brain showed small volume subdural hemorrhage along the dependent posterior right cerebral convexity, posterior falx and right tentorium.  Adjacent right occipital subarachnoid hemorrhage.  Diminished right sigmoid sinus flow void probably reflects slow flow. MRV was obtained as per radiology recommendation however it did not show any dural sinus thrombosis. Orthostatic vital signs could not be obtained due to dizziness. Also patient has improved with dix-hallpike and epley maneuvers done by physical therapy.  Patient will be discharged home on home health OT/PT.  We will also discharge meclizine 25 mg 3 times daily as needed for dizziness.   2. Positive COVID-19 PCR-COVID-19 PCR was positive, patient is asymptomatic, not requiring oxygen.  Does not have other symptoms of COVID-19 infection, no nausea vomiting or diarrhea.  No loss of taste or smell.    Patient was started on remdesivir for 3 days.  At this time patient is not requiring oxygen.  He was not started on steroids.   3. Atrial fibrillation-Eliquis on  hold as per neurosurgery for 1 week.  Continue Coreg, flecainide.   4. Frequent PVCs/bigeminy-patient continues to have frequent PVCs on telemetry, he is currently on flecainide per cardiology.  Cardiology had seen the patient, did not recommend any further changes of medications.  Continue current medications.  Follow-up cardiology  in 1 week as outpatient.  Echocardiogram obtained was unremarkable.  5. Hypertension-continue amlodipine Coreg, lisinopril.    6. Hyperlipidemia-continue Lipitor.   Procedures:  Echocardiogram  Consultations:  Cardiology  Neurosurgery  Discharge Exam: Vitals:   12/15/20 0536 12/15/20 0749  BP: 115/75 129/83  Pulse: 62 65  Resp: 16 16  Temp: 97.9 F (36.6 C) 98.2 F (36.8 C)  SpO2: 94% 96%    General: Appears in no acute distress Cardiovascular: S1-S2, regular Respiratory: Clear to auscultation bilaterally  Discharge Instructions   Discharge Instructions    Diet - low sodium heart healthy   Complete by: As directed    Increase activity slowly   Complete by: As directed      Allergies as of 12/15/2020   No Known Allergies     Medication List    STOP taking these medications   Eliquis 5 MG Tabs tablet Generic drug: apixaban     TAKE these medications   amLODipine 5 MG tablet Commonly known as: NORVASC Take 1 tablet (5 mg total) by mouth daily as needed.   atorvastatin 10 MG tablet Commonly known as: LIPITOR Take 1 tablet (10 mg total) by mouth daily at 6 PM.   carvedilol 25 MG tablet Commonly known as: COREG Take 1 tablet (25 mg total) by mouth 2 (two) times daily with a meal.   flecainide 100 MG tablet Commonly known as: TAMBOCOR TAKE ONE TABLET BY MOUTH TWICE A DAY   lisinopril 10 MG tablet Commonly known as: ZESTRIL Take 1 tablet (10 mg total) by mouth daily.   meclizine 25 MG tablet Commonly known as: ANTIVERT Take 1 tablet (25 mg total) by mouth 3 (three) times daily as needed for dizziness.   oxyCODONE-acetaminophen 5-325 MG tablet Commonly known as: Percocet Take 1 tablet by mouth every 8 (eight) hours as needed for severe pain.      No Known Allergies  Follow-up Information    McLean-Scocuzza, Pasty Spillers, MD Follow up in 2 week(s).   Specialty: Internal Medicine Contact information: [address] [place]  [address] [phone]        Antonieta Iba, MD Follow up in 1 week(s).   Specialty: Cardiology Contact information: [address] [place] [address] [phone]                The results of significant diagnostics from this hospitalization (including imaging, microbiology, ancillary and laboratory) are listed below for reference.    Significant Diagnostic Studies: CT HEAD WO CONTRAST  Result Date: 12/11/2020 CLINICAL DATA:  Subdural hematoma follow-up EXAM: CT HEAD WITHOUT CONTRAST TECHNIQUE: Contiguous axial images were obtained from the base of the skull through the vertex without intravenous contrast. COMPARISON:  MR head 12/10/2020, FINDINGS: Brain: Redemonstration of prior left frontal infarction/encephalomalacia. No evidence of large-territorial acute infarction. No parenchymal hemorrhage. Stable right parietooccipital hyperdensity within calcification consistent with cavernous malformation. Stable thin of fall seen subdural hematoma extending along the right tentorium measuring up to 4 mm. Hyperdensity along the right occipital sulci consistent with known trace subarachnoid hemorrhage that is better evaluated on MR head 12/10/2020 (2:20). No mass effect or midline shift. No hydrocephalus. Basilar cisterns are patent.  Vascular: No hyperdense vessel. Skull: No acute fracture or focal lesion. Sinuses/Orbits: Paranasal sinuses and mastoid air cells are clear. The orbits are unremarkable. Other: None. IMPRESSION: 1. Stable 4 mm right parafalcine/tentorial subdural hematoma. 2. Stable trace right occipital subarachnoid hemorrhage. 3. No new intracranial abnormality. Electronically Signed   By: Tish Frederickson M.D.   On: 12/11/2020 06:24   CT Head Wo Contrast  Result Date: 12/10/2020 CLINICAL DATA:  Follow-up subdural hematoma. EXAM: CT HEAD WITHOUT CONTRAST TECHNIQUE: Contiguous axial images were obtained from the base of the skull through the vertex without  intravenous contrast. COMPARISON:  Head CT earlier today at 1008 hour FINDINGS: Brain: Acute subdural hematoma along the falx and right tentorium, not significantly changed from earlier today. This measures up to 4 mm in thickness along the right tentorium, measured on series 4, image 46. Adjacent right occipital subarachnoid hemorrhage with slight increase from earlier today. No new hemorrhage. High density nodule in the right frontoparietal white matter measuring 1 cm with internal calcification, unchanged. No surrounding edema. Remote left MCA infarct. No evidence of acute ischemia. No hydrocephalus. No midline shift. Vascular: Atherosclerosis of skullbase vasculature without hyperdense vessel or abnormal calcification. Skull: No fracture or focal lesion. Sinuses/Orbits: Paranasal sinuses and mastoid air cells are clear. The visualized orbits are unremarkable. Other: None. IMPRESSION: 1. Stable appearance of acute subdural hematoma along the falx and right tentorium, not significantly changed from earlier today. This measures up to 4 mm in thickness along the right tentorium. 2. Adjacent right occipital subarachnoid hemorrhage with slight increase from earlier today. 3. Otherwise stable exam.  Remote left MCA infarct. Electronically Signed   By: Narda Rutherford M.D.   On: 12/10/2020 17:19   CT Head Wo Contrast  Result Date: 12/10/2020 CLINICAL DATA:  Dizziness with fall. EXAM: CT HEAD WITHOUT CONTRAST CT CERVICAL SPINE WITHOUT CONTRAST TECHNIQUE: Multidetector CT imaging of the head and cervical spine was performed following the standard protocol without intravenous contrast. Multiplanar CT image reconstructions of the cervical spine were also generated. COMPARISON:  10/13/2020 FINDINGS: CT HEAD FINDINGS Brain: Large remote left MCA branch infarct affecting the high and lateral left frontal lobe. High-density nodule in the right frontal parietal white matter measuring 1 cm and showing internal coarse  calcification. No perilesional edema or interval change. High-density subdural hematoma along the falx and right tentorium which measures up to 4 mm in thickness. Trace subarachnoid hemorrhage along the inferior right occipital lobe. No evidence of acute infarct.  No hydrocephalus or masslike finding Vascular: Atheromatous calcification Skull: Negative Sinuses/Orbits: Negative Critical Value/emergent results were called by telephone at the time of interpretation on 12/10/2020 at 10:28 am to provider Jene Every , who verbally acknowledged these results. CT CERVICAL SPINE FINDINGS Alignment: No traumatic malalignment Skull base and vertebrae: No acute fracture Soft tissues and spinal canal: No prevertebral fluid or swelling. No visible canal hematoma. Disc levels: Generalized disc space narrowing and endplate ridging. 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 Multilevel foraminal narrowing Upper chest: Emphysematous spaces. IMPRESSION: 1. Acute subdural hematoma along the falx and right tentorium, up to 4 mm in thickness. Trace adjacent subarachnoid hemorrhage. 2. Remote left MCA branch infarct. 3. Stable 1 cm nodule in the right cerebral white matter again favoring cavernoma or other benign process. 4. Negative for cervical spine fracture. Electronically Signed   By: Marnee Spring M.D.   On: 12/10/2020 10:33   CT CERVICAL SPINE WO CONTRAST  Result Date: 12/10/2020 CLINICAL DATA:  Dizziness with fall. EXAM:  CT HEAD WITHOUT CONTRAST CT CERVICAL SPINE WITHOUT CONTRAST TECHNIQUE: Multidetector CT imaging of the head and cervical spine was performed following the standard protocol without intravenous contrast. Multiplanar CT image reconstructions of the cervical spine were also generated. COMPARISON:  10/13/2020 FINDINGS: CT HEAD FINDINGS Brain: Large remote left MCA branch infarct affecting the high and lateral left frontal lobe. High-density nodule in the right frontal parietal white matter  measuring 1 cm and showing internal coarse calcification. No perilesional edema or interval change. High-density subdural hematoma along the falx and right tentorium which measures up to 4 mm in thickness. Trace subarachnoid hemorrhage along the inferior right occipital lobe. No evidence of acute infarct.  No hydrocephalus or masslike finding Vascular: Atheromatous calcification Skull: Negative Sinuses/Orbits: Negative Critical Value/emergent results were called by telephone at the time of interpretation on 12/10/2020 at 10:28 am to provider Jene Every , who verbally acknowledged these results. CT CERVICAL SPINE FINDINGS Alignment: No traumatic malalignment Skull base and vertebrae: No acute fracture Soft tissues and spinal canal: No prevertebral fluid or swelling. No visible canal hematoma. Disc levels: Generalized disc space narrowing and endplate ridging. 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 Multilevel foraminal narrowing Upper chest: Emphysematous spaces. IMPRESSION: 1. Acute subdural hematoma along the falx and right tentorium, up to 4 mm in thickness. Trace adjacent subarachnoid hemorrhage. 2. Remote left MCA branch infarct. 3. Stable 1 cm nodule in the right cerebral white matter again favoring cavernoma or other benign process. 4. Negative for cervical spine fracture. Electronically Signed   By: Marnee Spring M.D.   On: 12/10/2020 10:33   MR BRAIN WO CONTRAST  Result Date: 12/10/2020 CLINICAL DATA:  Dizziness, subdural hematoma EXAM: MRI HEAD WITHOUT CONTRAST TECHNIQUE: Multiplanar, multiecho pulse sequences of the brain and surrounding structures were obtained without intravenous contrast. COMPARISON:  Question made with prior CT imaging FINDINGS: Brain: There is no acute infarction. Subdural hemorrhage is present along the dependent posterior right cerebral convexity, posterior falx, and right tentorium. There is right occipital sulcal T2 FLAIR hyperintensity reflecting  subarachnoid hemorrhage. Chronic left frontal infarct with encephalomalacia and gliosis and chronic blood products. Heterogeneous 8 mm lesion in the parasagittal right parietal lobe with corresponding susceptibility is most consistent with a cavernous malformation. Additional susceptibility hypointensity in peripheral right temporal lobe likely reflects chronic blood products. Additional patchy T2 hyperintensity in the supratentorial white matter is nonspecific but probably reflects chronic microvascular ischemic changes. Vascular: Diminished right sigmoid sinus flow void probably reflects slow flow. No corresponding abnormal T1 hyperintensity. Major arterial flow voids at the skull base are preserved. Skull and upper cervical spine: Normal marrow signal is preserved. Sinuses/Orbits: Paranasal sinuses are aerated. Orbits are unremarkable. Other: Sella is unremarkable.  Mastoid air cells are clear. IMPRESSION: Small volume subdural hemorrhage along the dependent posterior right cerebral convexity, posterior falx, and right tentorium. Adjacent right occipital subarachnoid hemorrhage. Diminished right sigmoid sinus flow void probably reflects slow flow. CTV/MRV could be considered for confirmation. 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 Chronic microvascular ischemic changes. Electronically Signed   By: Guadlupe Spanish M.D.   On: 12/10/2020 21:14   DG Chest Port 1 View  Result Date: 12/10/2020 CLINICAL DATA:  Weakness. EXAM: PORTABLE CHEST 1 VIEW COMPARISON:  Chest/rib radiographs and chest CT 10/13/2020 FINDINGS: The cardiac silhouette is within normal limits for portable AP technique. A moderate-sized sliding hiatal hernia and thoracic aortic atherosclerosis are noted. There is mild chronic coarsening of the interstitial markings. No acute airspace consolidation, edema, pleural  effusion, or pneumothorax is identified. No acute osseous abnormality is seen. IMPRESSION: No  active disease. Electronically Signed   By: Sebastian Ache M.D.   On: 12/10/2020 08:53   MR MRV HEAD W WO CONTRAST  Result Date: 12/11/2020 CLINICAL DATA:  Possible abnormal sigmoid sinus on MRI brain EXAM: MR VENOGRAM HEAD WITHOUT AND WITH CONTRAST TECHNIQUE: Angiographic images of the intracranial venous structures were obtained using MRV technique without and with intravenous contrast. CONTRAST:  7.39mL GADAVIST GADOBUTROL 1 MMOL/ML IV SOLN COMPARISON:  Correlation made with prior MRI brain FINDINGS: Superior sagittal sinus, straight sinus, vein of Galen, internal cerebral veins are patent. Transverse sinuses are patent, dominant on the left. Right larger than left filling defects at the distal transverse sinuses likely reflect arachnoid granulations. Bilateral sigmoid sinuses are patent. IMPRESSION: No evidence of dural sinus thrombosis. Electronically Signed   By: Guadlupe Spanish M.D.   On: 12/11/2020 15:57   ECHOCARDIOGRAM COMPLETE  Result Date: 12/14/2020    ECHOCARDIOGRAM REPORT   Patient Name:   Peter Castro Date of Exam: 12/14/2020 Medical Rec #:  [id number]            Height:       65.0 in Accession #:    [id number]           Weight:       194.0 lb Date of Birth:  04-Mar-1946           BSA:          1.953 m Patient Age:    74 years             BP:           114/77 mmHg Patient Gender: M                    HR:           69 bpm. Exam Location:  [facility] Procedure: 2D Echo Indications:     Syncope R55  History:         Patient has no prior history of Echocardiogram examinations.  Sonographer:     Wonda Cerise RDCS Referring Phys:  [id number] BERGE Diagnosing Phys: Jodelle Red MD  Sonographer Comments: Technically difficult study due to poor echo windows. Image acquisition challenging due to patient body habitus and Image acquisition challenging due to respiratory motion. IMPRESSIONS  1. Left ventricular ejection fraction, by estimation, is 55 to 60%. The left ventricle has normal  function. The left ventricle has no regional wall motion abnormalities. Left ventricular diastolic parameters were normal.  2. Right ventricular systolic function is normal. The right ventricular size is normal.  3. The mitral valve is normal in structure. Trivial mitral valve regurgitation. No evidence of mitral stenosis.  4. The aortic valve is grossly normal. Aortic valve regurgitation is not visualized. No aortic stenosis is present. Comparison(s): No prior Echocardiogram. Conclusion(s)/Recommendation(s): Normal biventricular function without evidence of hemodynamically significant valvular heart disease. FINDINGS  Left Ventricle: Left ventricular ejection fraction, by estimation, is 55 to 60%. The left ventricle has normal function. The left ventricle has no regional wall motion abnormalities. The left ventricular internal cavity size was normal in size. There is  no left ventricular hypertrophy. Left ventricular diastolic parameters were normal. Right Ventricle: The right ventricular size is normal. No increase in right ventricular wall thickness. Right ventricular systolic function is normal. The tricuspid regurgitant velocity is 2.48 m/s, and with an assumed right atrial pressure of 8 mmHg, the  estimated right ventricular systolic pressure is 32.6 mmHg. Left Atrium: Left atrial size was normal in size. Right Atrium: Right atrial size was normal in size. Pericardium: There is no evidence of pericardial effusion. Presence of pericardial fat pad. Mitral Valve: The mitral valve is normal in structure. Trivial mitral valve regurgitation. No evidence of mitral valve stenosis. Tricuspid Valve: The tricuspid valve is normal in structure. Tricuspid valve regurgitation is trivial. Aortic Valve: The aortic valve is grossly normal. Aortic valve regurgitation is not visualized. No aortic stenosis is present. Aortic valve peak gradient measures 6.4 mmHg. Pulmonic Valve: The pulmonic valve was not well visualized. Pulmonic  valve regurgitation is not visualized. No evidence of pulmonic stenosis. Aorta: The aortic root and ascending aorta are structurally normal, with no evidence of dilitation. Venous: The inferior vena cava was not well visualized. IAS/Shunts: The atrial septum is grossly normal.  LEFT VENTRICLE PLAX 2D LVIDd:         5.36 cm  Diastology LVIDs:         3.72 cm  LV e' medial:   7.83 cm/s LV PW:         1.01 cm  LV E/e' medial: 7.3 LV IVS:        0.92 cm LVOT diam:     2.20 cm LV SV:         40 LV SV Index:   20 LVOT Area:     3.80 cm  RIGHT VENTRICLE RV Basal diam:  3.42 cm RV S prime:     13.70 cm/s TAPSE (M-mode): 1.8 cm LEFT ATRIUM             Index       RIGHT ATRIUM           Index LA diam:        5.00 cm 2.56 cm/m  RA Area:     14.50 cm LA Vol (A2C):   31.0 ml 15.88 ml/m RA Volume:   35.10 ml  17.98 ml/m LA Vol (A4C):   31.7 ml 16.23 ml/m LA Biplane Vol: 32.7 ml 16.75 ml/m  AORTIC VALVE                PULMONIC VALVE AV Area (Vmax): 1.79 cm    PV Vmax:       1.00 m/s AV Vmax:        126.00 cm/s PV Peak grad:  4.0 mmHg AV Peak Grad:   6.4 mmHg LVOT Vmax:      59.40 cm/s LVOT Vmean:     38.300 cm/s LVOT VTI:       0.105 m  AORTA Ao Root diam: 3.00 cm MITRAL VALVE               TRICUSPID VALVE MV Area (PHT): 3.10 cm    TV Peak grad:   25.8 mmHg MV Decel Time: 245 msec    TV Vmax:        2.54 m/s MV E velocity: 57.00 cm/s  TR Peak grad:   24.6 mmHg MV A velocity: 24.40 cm/s  TR Vmax:        248.00 cm/s MV E/A ratio:  2.34                            SHUNTS                            Systemic VTI:  0.10 m                            Systemic Diam: 2.20 cm Jodelle Red MD Electronically signed by Jodelle Red MD Signature Date/Time: 12/14/2020/2:06:37 PM    Final     Microbiology: Recent Results (from the past 240 hour(s))  SARS CORONAVIRUS 2 (TAT 6-24 HRS) Nasopharyngeal     Status: Abnormal   Collection Time: 12/10/20 10:52 AM   Specimen: Nasopharyngeal  Result Value Ref Range Status    SARS Coronavirus 2 POSITIVE (A) NEGATIVE Final    Comment: (NOTE) SARS-CoV-2 target nucleic acids are DETECTED.  The SARS-CoV-2 RNA is generally detectable in upper and lower respiratory specimens during the acute phase of infection. Positive results are indicative of the presence of SARS-CoV-2 RNA. Clinical correlation with patient history and other diagnostic information is  necessary to determine patient infection status. Positive results do not rule out bacterial infection or co-infection with other viruses.  The expected result is Negative.  Fact Sheet for Patients: HairSlick.no  Fact Sheet for Healthcare Providers: quierodirigir.com  This test is not yet approved or cleared by the [place] FDA and  has been authorized for detection and/or diagnosis of SARS-CoV-2 by FDA under an Emergency Use Authorization (EUA). This EUA will remain  in effect (meaning this test can be used) for the duration of the COVID-19 declaration under Section 564(b)(1) of the Act, 21 U. S.C. section 360bbb-3(b)(1), unless the authorization is terminated or revoked sooner.   Performed at [facility], [address] [place], [place] [address]   MRSA PCR Screening     Status: None   Collection Time: 12/12/20  1:00 PM   Specimen: Nasopharyngeal  Result Value Ref Range Status   MRSA by PCR NEGATIVE NEGATIVE Final    Comment:        The GeneXpert MRSA Assay (FDA approved for NASAL specimens only), is one component of a comprehensive MRSA colonization surveillance program. It is not intended to diagnose MRSA infection nor to guide or monitor treatment for MRSA infections. Performed at [facility], [address] [place], [place] [address]      Labs: Basic Metabolic Panel: Recent Labs  Lab 12/09/20 1000 12/10/20 0658  NA 137 137  K 4.4 4.0  CL 101 102  CO2 27 25  GLUCOSE 117* 112*  BUN 19 19  CREATININE 1.18  1.06  CALCIUM 8.8* 8.8*   Liver Function Tests: Recent Labs  Lab 12/10/20 0658  AST 25  ALT 22  ALKPHOS 79  BILITOT 1.2  PROT 6.8  ALBUMIN 4.1   No results for input(s): LIPASE, AMYLASE in the last 168 hours. No results for input(s): AMMONIA in the last 168 hours. CBC: Recent Labs  Lab 12/09/20 1000 12/10/20 0658  WBC 11.8* 9.5  NEUTROABS  --  7.1  HGB 15.8 15.8  HCT 47.2 47.5  MCV 89.9 90.3  PLT 159 146*       Signed:  Meredeth Ide MD.  [facility] 12/15/2020, 11:03 AM

## 2020-12-15 NOTE — TOC Transition Note (Addendum)
Transition of Care [facility]) - CM/SW Discharge Note   Patient Details  Name: Peter Castro MRN: [id number] Date of Birth: 30-Jun-1946  Transition of Care [facility]) CM/SW Contact:  [name], RN Phone Number: 12/15/2020, 12:33 PM   Clinical Narrative:   2/6 DC orders written today with [facility] PT/OT orders. Securing [facility] agency. Per PT notes patient feels he has a walker at home. Called patient and he seemed confused. RN said someone else is picking him due to Covid status. [employer] will deliver walker to home address as he is all ready to leave and unit RN felt this was safe. Still trying to secure [facility] agency but patient is leaving now. Will continue to try and have them contact son/patient at home. [name] RN CM  Post discharge update: DME order for RW not entered and [employer] cannot deliver DME. Will try to contact son to confirm walker or not at home. Still trying to secure [facility] agency. [name] RN CM  1420 Post discharge update. [name] PT/OT and will reach out to family/patient. [name] RN CM  Final next level of care: Home w Home Health Services Barriers to Discharge: Barriers Resolved   Patient Goals and CMS Choice Patient states their goals for this hospitalization and ongoing recovery are:: SNF rehab CMS Medicare.gov Compare Post Acute Care list provided to:: Patient Choice offered to / list presented to : Patient,Adult Children  Discharge Placement                       Discharge Plan and Services                DME Arranged: N/A (Has walker at home)         [facility] Arranged: PT          Social Determinants of Health (SDOH) Interventions     Readmission Risk Interventions No flowsheet data found.

## 2020-12-16 ENCOUNTER — Telehealth: Payer: Self-pay

## 2020-12-16 NOTE — Telephone Encounter (Signed)
Transition Care Management Unsuccessful Follow-up Telephone Call  Date of discharge and from where:  12/15/20 from [facility]  Attempts:  1st Attempt  Reason for unsuccessful TCM follow-up call:  Unable to leave message.  Voicemail full. Will follow.

## 2020-12-17 DIAGNOSIS — S065X0D Traumatic subdural hemorrhage without loss of consciousness, subsequent encounter: Secondary | ICD-10-CM | POA: Diagnosis not present

## 2020-12-17 DIAGNOSIS — I4891 Unspecified atrial fibrillation: Secondary | ICD-10-CM | POA: Diagnosis not present

## 2020-12-17 DIAGNOSIS — E785 Hyperlipidemia, unspecified: Secondary | ICD-10-CM | POA: Diagnosis not present

## 2020-12-17 DIAGNOSIS — I493 Ventricular premature depolarization: Secondary | ICD-10-CM | POA: Diagnosis not present

## 2020-12-17 DIAGNOSIS — I10 Essential (primary) hypertension: Secondary | ICD-10-CM | POA: Diagnosis not present

## 2020-12-17 DIAGNOSIS — K219 Gastro-esophageal reflux disease without esophagitis: Secondary | ICD-10-CM | POA: Diagnosis not present

## 2020-12-17 DIAGNOSIS — S066X0D Traumatic subarachnoid hemorrhage without loss of consciousness, subsequent encounter: Secondary | ICD-10-CM | POA: Diagnosis not present

## 2020-12-17 DIAGNOSIS — I482 Chronic atrial fibrillation, unspecified: Secondary | ICD-10-CM | POA: Diagnosis not present

## 2020-12-17 DIAGNOSIS — Z87891 Personal history of nicotine dependence: Secondary | ICD-10-CM | POA: Diagnosis not present

## 2020-12-17 DIAGNOSIS — U071 COVID-19: Secondary | ICD-10-CM | POA: Diagnosis not present

## 2020-12-17 DIAGNOSIS — M1990 Unspecified osteoarthritis, unspecified site: Secondary | ICD-10-CM | POA: Diagnosis not present

## 2020-12-17 NOTE — Telephone Encounter (Signed)
Transition Care Management Unsuccessful Follow-up Telephone Call  Date of discharge and from where:  12/15/20 from [facility]  Attempts:  2nd Attempt  Reason for unsuccessful TCM follow-up call:  No answer/busy.  Unable to leave a message. Mailbox full.

## 2020-12-18 NOTE — Telephone Encounter (Signed)
Transition Care Management Unsuccessful Follow-up Telephone Call  Date of discharge and from where:  12/15/20 from [facility]  Attempts:  3rd Attempt  Reason for unsuccessful TCM follow-up call:  Unable to leave message

## 2020-12-19 ENCOUNTER — Telehealth: Payer: Self-pay

## 2020-12-19 NOTE — Telephone Encounter (Signed)
Faxed signed PO back to [facility] @ [phone] ON 12/19/20 Client coord note report

## 2020-12-24 DIAGNOSIS — S066X0D Traumatic subarachnoid hemorrhage without loss of consciousness, subsequent encounter: Secondary | ICD-10-CM | POA: Diagnosis not present

## 2020-12-24 DIAGNOSIS — M1990 Unspecified osteoarthritis, unspecified site: Secondary | ICD-10-CM | POA: Diagnosis not present

## 2020-12-24 DIAGNOSIS — E785 Hyperlipidemia, unspecified: Secondary | ICD-10-CM | POA: Diagnosis not present

## 2020-12-24 DIAGNOSIS — I10 Essential (primary) hypertension: Secondary | ICD-10-CM | POA: Diagnosis not present

## 2020-12-24 DIAGNOSIS — I4891 Unspecified atrial fibrillation: Secondary | ICD-10-CM | POA: Diagnosis not present

## 2020-12-24 DIAGNOSIS — I493 Ventricular premature depolarization: Secondary | ICD-10-CM | POA: Diagnosis not present

## 2020-12-24 DIAGNOSIS — Z87891 Personal history of nicotine dependence: Secondary | ICD-10-CM | POA: Diagnosis not present

## 2020-12-24 DIAGNOSIS — U071 COVID-19: Secondary | ICD-10-CM | POA: Diagnosis not present

## 2020-12-24 DIAGNOSIS — S065X0D Traumatic subdural hemorrhage without loss of consciousness, subsequent encounter: Secondary | ICD-10-CM | POA: Diagnosis not present

## 2020-12-24 DIAGNOSIS — K219 Gastro-esophageal reflux disease without esophagitis: Secondary | ICD-10-CM | POA: Diagnosis not present

## 2020-12-25 ENCOUNTER — Ambulatory Visit (INDEPENDENT_AMBULATORY_CARE_PROVIDER_SITE_OTHER): Payer: Medicare HMO

## 2020-12-25 ENCOUNTER — Ambulatory Visit (INDEPENDENT_AMBULATORY_CARE_PROVIDER_SITE_OTHER): Payer: Medicare HMO | Admitting: Cardiovascular Disease

## 2020-12-25 ENCOUNTER — Other Ambulatory Visit: Payer: Self-pay

## 2020-12-25 ENCOUNTER — Encounter: Payer: Self-pay | Admitting: Cardiovascular Disease

## 2020-12-25 VITALS — BP 110/78 | HR 64 | Ht 65.0 in | Wt 196.0 lb

## 2020-12-25 DIAGNOSIS — Z87891 Personal history of nicotine dependence: Secondary | ICD-10-CM | POA: Diagnosis not present

## 2020-12-25 DIAGNOSIS — I480 Paroxysmal atrial fibrillation: Secondary | ICD-10-CM

## 2020-12-25 DIAGNOSIS — I639 Cerebral infarction, unspecified: Secondary | ICD-10-CM

## 2020-12-25 DIAGNOSIS — S066X0D Traumatic subarachnoid hemorrhage without loss of consciousness, subsequent encounter: Secondary | ICD-10-CM | POA: Diagnosis not present

## 2020-12-25 DIAGNOSIS — I10 Essential (primary) hypertension: Secondary | ICD-10-CM | POA: Diagnosis not present

## 2020-12-25 DIAGNOSIS — K219 Gastro-esophageal reflux disease without esophagitis: Secondary | ICD-10-CM | POA: Diagnosis not present

## 2020-12-25 DIAGNOSIS — S065X9A Traumatic subdural hemorrhage with loss of consciousness of unspecified duration, initial encounter: Secondary | ICD-10-CM

## 2020-12-25 DIAGNOSIS — I493 Ventricular premature depolarization: Secondary | ICD-10-CM | POA: Diagnosis not present

## 2020-12-25 DIAGNOSIS — U071 COVID-19: Secondary | ICD-10-CM | POA: Diagnosis not present

## 2020-12-25 DIAGNOSIS — I4891 Unspecified atrial fibrillation: Secondary | ICD-10-CM | POA: Diagnosis not present

## 2020-12-25 DIAGNOSIS — E782 Mixed hyperlipidemia: Secondary | ICD-10-CM

## 2020-12-25 DIAGNOSIS — S065XAA Traumatic subdural hemorrhage with loss of consciousness status unknown, initial encounter: Secondary | ICD-10-CM

## 2020-12-25 DIAGNOSIS — M1990 Unspecified osteoarthritis, unspecified site: Secondary | ICD-10-CM | POA: Diagnosis not present

## 2020-12-25 DIAGNOSIS — I700 Atherosclerosis of aorta: Secondary | ICD-10-CM

## 2020-12-25 DIAGNOSIS — S065X0D Traumatic subdural hemorrhage without loss of consciousness, subsequent encounter: Secondary | ICD-10-CM | POA: Diagnosis not present

## 2020-12-25 DIAGNOSIS — E785 Hyperlipidemia, unspecified: Secondary | ICD-10-CM | POA: Diagnosis not present

## 2020-12-25 NOTE — Patient Instructions (Addendum)
Medication Instructions:  No changes  Lab work: No new labs needed  Testing/Procedures: Zio monitor for paroxysmal atrial fibrillation Heart monitor (Zio patch) for 2 weeks (14 days)   Your physician has recommended that you wear a Zio monitor. This monitor is a medical device that records the heart's electrical activity. Doctors most often use these monitors to diagnose arrhythmias. Arrhythmias are problems with the speed or rhythm of the heartbeat. The monitor is a small device applied to your chest. You can wear one while you do your normal daily activities. While wearing this monitor if you have any symptoms to push the button and record what you felt. Once you have worn this monitor for the period of time provider prescribed (Usually 14 days), you will return the monitor device in the postage paid box. Once it is returned they will download the data collected and provide [place] with a report which the provider will then review and we will call you with those results. Important tips:  1. Avoid showering during the first 24 hours of wearing the monitor. 2. Avoid excessive sweating to help maximize wear time. 3. Do not submerge the device, no hot tubs, and no swimming pools. 4. Keep any lotions or oils away from the patch. 5. After 24 hours you may shower with the patch on. Take brief showers with your back facing the shower head.  6. Do not remove patch once it has been placed because that will interrupt data and decrease adhesive wear time. 7. Push the button when you have any symptoms and write down what you were feeling. 8. Once you have completed wearing your monitor, remove and place into box which has postage paid and place in your outgoing mailbox.  9. If for some reason you have misplaced your box then call our office and we can provide another box and/or mail it off for you.        Follow-Up:  . You will need a follow up appointment in 3 months  . Providers on your designated  Care Team:   . Nicolasa Ducking, NP . Eula Listen, PA-C . Marisue Ivan, PA-C

## 2020-12-25 NOTE — Progress Notes (Signed)
Cardiology Office Note  Date:  12/25/2020   ID:  Peter Castro, DOB: [birth year], MRN: [id number]  PCP:  McLean-Scocuzza, Pasty Spillers, MD   Chief Complaint  Patient presents with  . Other    Hospital follow up - Patient states nothing has been wrong since hospital visit. Meds reviewed verbally with patient.     HPI:  Mr. Peter Castro is a 75 y.o. male with a history of: Cerebrovascular accident (CVA) (HCC) Memory loss, speech impairment Atrial fibrillation (HCC), ablation in [place] 2015 No documented recurrence of atrial fibrillation since that time Essential hypertension Hyperlipidemia Former smoker, quit 20 years ago  Who presents for his atrial fibrillation, SDH(spontaneous)/dizziness  In hospital Positive COVID-19 PCR-COVID-19 PCR was positive, Was at [facility] when he developed dizziness acutely, Had a fall, no head trauma CT scan:  acute SDH along the falx right tentorium, no significant change on repeat CT scan,  4 mm in thickness along the right tentorium. Adjacent right occipital subdural hemorrhage with slight increase from earlier.   Eliquis was held Concern for spontaneous bleed, not fall, there was no head trauma In hospital monitor for several days, persistent dizziness worse with sitting up in bed, movement of the head, gait instability, high fall risk  Discharge instructions recommend he follow-up with neurology/neurosurgery  In follow-up today reports he is scheduled to see PT Reports his balance is much better, dizziness has essentially resolved, still some mild symptoms  Wonders if he needs to be on Eliquis at all Denies any tachypalpitations concerning for atrial fibrillation  Details reviewed, had ablation in [place]  EKG personally reviewed by myself on todays visit Shows NSr rate 64 beats per minute no significant ST-T wave changes   Other past medical history reviewed Previously seen in [place] last seen 10/20/17 had cryo ballon pulm vein isolation  and antral substrate modifications with cavo tricuspid isthmus flutter ablation 01/09/14 prior to this failed multiple cardioversions and failed antiarrhythmic meds last DCCV 12/12/13 then put on flecainide   Echo 08/18/13 LVEF 40-45 % mild sclerotic aortic valve, mild MR   PMH:   has a past medical history of A-fib (HCC), Arthritis, Atrial flutter (HCC), GERD (gastroesophageal reflux disease), History of chicken pox, Hyperlipidemia, Hypertension, Loss of memory, Stroke (HCC), Subarachnoid hemorrhage (HCC), and Subdural hematoma (HCC).  PSH:    Past Surgical History:  Procedure Laterality Date  . APPENDECTOMY    . OTHER SURGICAL HISTORY     cryoballon pvi isthmus tricuspid ablation Dr. Kathrynn Ducking 01/2014 [place]     Current Outpatient Medications  Medication Sig Dispense Refill  . amLODipine (NORVASC) 5 MG tablet Take 1 tablet (5 mg total) by mouth daily as needed. 90 tablet 3  . atorvastatin (LIPITOR) 10 MG tablet Take 1 tablet (10 mg total) by mouth daily at 6 PM. 90 tablet 3  . carvedilol (COREG) 25 MG tablet Take 1 tablet (25 mg total) by mouth 2 (two) times daily with a meal. 180 tablet 3  . flecainide (TAMBOCOR) 100 MG tablet TAKE ONE TABLET BY MOUTH TWICE A DAY 180 tablet 2  . lisinopril (ZESTRIL) 10 MG tablet Take 1 tablet (10 mg total) by mouth daily. 90 tablet 3  . meclizine (ANTIVERT) 25 MG tablet Take 1 tablet (25 mg total) by mouth 3 (three) times daily as needed for dizziness. 15 tablet 0  . oxyCODONE-acetaminophen (PERCOCET) 5-325 MG tablet Take 1 tablet by mouth every 8 (eight) hours as needed for severe pain. 15 tablet 0   No current  facility-administered medications for this visit.     ALLERGIES:   Patient has no known allergies.   SOCIAL HISTORY:  The patient  reports that he has quit smoking. He has never used smokeless tobacco. He reports current alcohol use. He reports previous drug use.   FAMILY HISTORY:   family history includes Cancer in his father; Dementia in his  mother.    REVIEW OF SYSTEMS: Review of Systems  Constitutional: Negative.   Eyes: Negative.   Respiratory: Negative.   Cardiovascular: Negative.  Negative for leg swelling.  Gastrointestinal: Negative.   Genitourinary: Negative.   Musculoskeletal: Negative.   Neurological: Negative.   Psychiatric/Behavioral: Negative.   All other systems reviewed and are negative.   PHYSICAL EXAM: VS:  BP 110/78 (BP Location: Left Arm, Patient Position: Sitting, Cuff Size: Normal)   Pulse 64   Ht 5\' 5"  (1.651 m)   Wt 196 lb (88.9 kg)   BMI 32.62 kg/m  , BMI Body mass index is 32.62 kg/m.  Constitutional:  oriented to person, place, and time. No distress.  HENT:  Head: Grossly normal Eyes:  no discharge. No scleral icterus.  Neck: No JVD, no carotid bruits  Cardiovascular: Regular rate and rhythm, no murmurs appreciated Pulmonary/Chest: Clear to auscultation bilaterally, no wheezes or rails Abdominal: Soft.  no distension.  no tenderness.  Musculoskeletal: Normal range of motion Neurological:  normal muscle tone. Coordination normal. No atrophy some speech impediment Skin: Skin warm and dry Psychiatric: normal affect, pleasant   RECENT LABS: 12/10/2020: ALT 22; BUN 19; Creatinine, Ser 1.06; Hemoglobin 15.8; Platelets 146; Potassium 4.0; Sodium 137    LIPID PANEL: Lab Results  Component Value Date   CHOL 153 10/11/2020   HDL 56.10 10/11/2020   LDLCALC 84 10/11/2020   TRIG 61.0 10/11/2020      WEIGHT: Wt Readings from Last 3 Encounters:  12/25/20 196 lb (88.9 kg)  12/10/20 194 lb (88 kg)  12/09/20 185 lb (83.9 kg)      ASSESSMENT AND PLAN:  Paroxysmal atrial fibrillation (HCC) - Plan: EKG 12-Lead Maintaining normal sinus rhythm No documentation of atrial fibrillation since his ablation that we are aware of Eliquis recently held in the setting of concern of spontaneous bleed, subdural hematoma leading to profound dizziness, gait instability We will continue flecainide  and carvedilol Eliquis on hold for now, ZIO monitor placed to determine arrhythmia burden Scheduled to see PT following recovery for SDH  Essential hypertension Blood pressure is well controlled on today's visit. No changes made to the medications.  Cerebrovascular accident (CVA), unspecified mechanism (HCC) History of atrial fibrillation No recurrent TIA or stroke symptoms Eliquis on hold for now in the setting of what appears to be subdural hematoma, spontaneous as it did not appear to be any trauma  Mixed hyperlipidemia Numbers close to goal, continue Lipitor  Subdural hematoma Hospital records reviewed in detail, Appeared to be spontaneous while walking at [facility], leading to dizziness, then a fall Was very unstable on his feet in the hospital secondary to dizziness unrelated to orthostasis Slow recovery, starting to work with PT, Eliquis on hold   Total encounter time more than 35 minutes. Greater than 50% was spent in counseling and coordination of care with the patient.    Orders Placed This Encounter  Procedures  . EKG 12-Lead    Signed, [facility], M.D., Ph.D. 12/25/2020  [facility] [place], [place]

## 2020-12-27 ENCOUNTER — Telehealth: Payer: Self-pay | Admitting: Cardiovascular Disease

## 2020-12-27 NOTE — Telephone Encounter (Signed)
Patient calling in stating he has been taking an 81 mg Bayer once daily. Patient calling in to find out if he needs to continue this based on other medications he is taking by Dr. Mariah Milling

## 2020-12-27 NOTE — Telephone Encounter (Signed)
Was able to reach back out to Peter Castro regarding ASA. Pt reports would like to start taking this medcation, reports his PCP stated it would be a good idea considering his hx of A-fib. Advised pt it would be ok to take daily ASA 81 mg long as there is not any chronic kidneys issues or history of bleeding issues. Pt denies both, stating ASA daily, will add to pt's medication list. Pt reports still wearing Zio monitor, is aware based on results Dr. Mariah Milling will discuss Liquids as talked about at last visit. Otherwise all questions or concerns were address and no additional concerns at this time. Agreeable to plan, will call back for anything further.

## 2020-12-30 ENCOUNTER — Telehealth: Payer: Self-pay | Admitting: Internal Medicine

## 2020-12-30 NOTE — Telephone Encounter (Signed)
Left message for patient to call back and schedule Medicare Annual Wellness Visit (AWV)   This should be a virtual visit only=30 minutes.  No hx of AWV; please schedule at anytime with Denisa O'Brien-Blaney at [place]   

## 2020-12-31 ENCOUNTER — Telehealth: Payer: Self-pay

## 2020-12-31 NOTE — Telephone Encounter (Signed)
Faxed signed order number [id number] to [facility] [phone] on 12/31/20.

## 2021-01-01 DIAGNOSIS — S065X0D Traumatic subdural hemorrhage without loss of consciousness, subsequent encounter: Secondary | ICD-10-CM | POA: Diagnosis not present

## 2021-01-01 DIAGNOSIS — M1990 Unspecified osteoarthritis, unspecified site: Secondary | ICD-10-CM | POA: Diagnosis not present

## 2021-01-01 DIAGNOSIS — I4891 Unspecified atrial fibrillation: Secondary | ICD-10-CM | POA: Diagnosis not present

## 2021-01-01 DIAGNOSIS — I10 Essential (primary) hypertension: Secondary | ICD-10-CM | POA: Diagnosis not present

## 2021-01-01 DIAGNOSIS — Z87891 Personal history of nicotine dependence: Secondary | ICD-10-CM | POA: Diagnosis not present

## 2021-01-01 DIAGNOSIS — K219 Gastro-esophageal reflux disease without esophagitis: Secondary | ICD-10-CM | POA: Diagnosis not present

## 2021-01-01 DIAGNOSIS — I493 Ventricular premature depolarization: Secondary | ICD-10-CM | POA: Diagnosis not present

## 2021-01-01 DIAGNOSIS — U071 COVID-19: Secondary | ICD-10-CM | POA: Diagnosis not present

## 2021-01-01 DIAGNOSIS — S066X0D Traumatic subarachnoid hemorrhage without loss of consciousness, subsequent encounter: Secondary | ICD-10-CM | POA: Diagnosis not present

## 2021-01-01 DIAGNOSIS — E785 Hyperlipidemia, unspecified: Secondary | ICD-10-CM | POA: Diagnosis not present

## 2021-01-06 DIAGNOSIS — K219 Gastro-esophageal reflux disease without esophagitis: Secondary | ICD-10-CM | POA: Diagnosis not present

## 2021-01-06 DIAGNOSIS — I10 Essential (primary) hypertension: Secondary | ICD-10-CM | POA: Diagnosis not present

## 2021-01-06 DIAGNOSIS — Z87891 Personal history of nicotine dependence: Secondary | ICD-10-CM | POA: Diagnosis not present

## 2021-01-06 DIAGNOSIS — S066X0D Traumatic subarachnoid hemorrhage without loss of consciousness, subsequent encounter: Secondary | ICD-10-CM | POA: Diagnosis not present

## 2021-01-06 DIAGNOSIS — S065X0D Traumatic subdural hemorrhage without loss of consciousness, subsequent encounter: Secondary | ICD-10-CM | POA: Diagnosis not present

## 2021-01-06 DIAGNOSIS — E785 Hyperlipidemia, unspecified: Secondary | ICD-10-CM | POA: Diagnosis not present

## 2021-01-06 DIAGNOSIS — I493 Ventricular premature depolarization: Secondary | ICD-10-CM | POA: Diagnosis not present

## 2021-01-06 DIAGNOSIS — I4891 Unspecified atrial fibrillation: Secondary | ICD-10-CM | POA: Diagnosis not present

## 2021-01-06 DIAGNOSIS — M1990 Unspecified osteoarthritis, unspecified site: Secondary | ICD-10-CM | POA: Diagnosis not present

## 2021-01-06 DIAGNOSIS — U071 COVID-19: Secondary | ICD-10-CM | POA: Diagnosis not present

## 2021-01-08 DIAGNOSIS — I480 Paroxysmal atrial fibrillation: Secondary | ICD-10-CM | POA: Diagnosis not present

## 2021-01-16 DIAGNOSIS — I480 Paroxysmal atrial fibrillation: Secondary | ICD-10-CM | POA: Diagnosis not present

## 2021-01-17 ENCOUNTER — Ambulatory Visit: Payer: Medicare HMO | Admitting: Cardiovascular Disease

## 2021-02-03 ENCOUNTER — Telehealth: Payer: Self-pay | Admitting: *Deleted

## 2021-02-03 NOTE — Telephone Encounter (Signed)
Left voicemail message to call back for review of results.  

## 2021-02-03 NOTE — Telephone Encounter (Signed)
-----   Message from Antonieta Iba, MD sent at 02/02/2021  5:54 PM EDT ----- Event Monitor Sinus Rhythm. avg HR of 62 bpm.    1 run of Supraventricular Tachycardia occurred lasting 4 beats with a max rate of 111 bpm (avg 104 bpm).   Isolated VEs were occasional (1.6%, [id number]), Ventricular Bigeminy and Trigeminy were present.   No patient triggered events noted

## 2021-02-05 NOTE — Telephone Encounter (Signed)
Left voicemail message to call back for review of results.  

## 2021-02-09 ENCOUNTER — Other Ambulatory Visit: Payer: Self-pay | Admitting: Cardiovascular Disease

## 2021-02-09 ENCOUNTER — Other Ambulatory Visit: Payer: Self-pay | Admitting: Internal Medicine

## 2021-02-09 DIAGNOSIS — I4891 Unspecified atrial fibrillation: Secondary | ICD-10-CM

## 2021-02-09 DIAGNOSIS — I10 Essential (primary) hypertension: Secondary | ICD-10-CM

## 2021-02-10 NOTE — Telephone Encounter (Signed)
Rx request sent to pharmacy.  

## 2021-02-10 NOTE — Telephone Encounter (Signed)
Reviewed results with patient and confirmed his upcoming appointment. He verbalized understanding with no further questions.

## 2021-03-04 DIAGNOSIS — Z20822 Contact with and (suspected) exposure to covid-19: Secondary | ICD-10-CM | POA: Diagnosis not present

## 2021-03-04 DIAGNOSIS — Z03818 Encounter for observation for suspected exposure to other biological agents ruled out: Secondary | ICD-10-CM | POA: Diagnosis not present

## 2021-03-15 ENCOUNTER — Other Ambulatory Visit: Payer: Self-pay | Admitting: Internal Medicine

## 2021-03-24 ENCOUNTER — Telehealth: Payer: Self-pay | Admitting: Internal Medicine

## 2021-03-24 NOTE — Telephone Encounter (Deleted)
  LAST APPOINTMENT DATE: 03/15/2021   NEXT APPOINTMENT DATE:@6 /01/2021  MEDICATION:  PHARMACY:  Let patient know to contact pharmacy at the end of the day to make sure medication is ready.  Please notify patient to allow 48-72 hours to process  Encourage patient to contact the pharmacy for refills or they can request refills through [facility]  CLINICAL FILLS OUT ALL BELOW:   LAST REFILL:  QTY:  Left message for patient to call back and schedule Medicare Annual Wellness Visit (AWV) in office.   If not able to come in office, please offer to do virtually or by telephone.   Due for AWVI  Please schedule at anytime with Nurse Health Advisor.

## 2021-03-24 NOTE — Telephone Encounter (Signed)
Left message for patient to call back and schedule Medicare Annual Wellness Visit (AWV) in office.   If not able to come in office, please offer to do virtually or by telephone.   Due for AWVI  Please schedule at anytime with Nurse Health Advisor.   

## 2021-03-25 ENCOUNTER — Ambulatory Visit: Payer: Medicare HMO | Admitting: Cardiovascular Disease

## 2021-03-25 ENCOUNTER — Other Ambulatory Visit: Payer: Self-pay

## 2021-03-25 ENCOUNTER — Encounter: Payer: Self-pay | Admitting: Cardiovascular Disease

## 2021-03-25 VITALS — BP 130/90 | HR 62 | Ht 67.0 in | Wt 197.4 lb

## 2021-03-25 DIAGNOSIS — I480 Paroxysmal atrial fibrillation: Secondary | ICD-10-CM | POA: Diagnosis not present

## 2021-03-25 DIAGNOSIS — E782 Mixed hyperlipidemia: Secondary | ICD-10-CM

## 2021-03-25 DIAGNOSIS — I639 Cerebral infarction, unspecified: Secondary | ICD-10-CM

## 2021-03-25 DIAGNOSIS — I10 Essential (primary) hypertension: Secondary | ICD-10-CM

## 2021-03-25 DIAGNOSIS — I700 Atherosclerosis of aorta: Secondary | ICD-10-CM

## 2021-03-25 DIAGNOSIS — S065X9A Traumatic subdural hemorrhage with loss of consciousness of unspecified duration, initial encounter: Secondary | ICD-10-CM

## 2021-03-25 DIAGNOSIS — S065XAA Traumatic subdural hemorrhage with loss of consciousness status unknown, initial encounter: Secondary | ICD-10-CM

## 2021-03-25 DIAGNOSIS — S065X9D Traumatic subdural hemorrhage with loss of consciousness of unspecified duration, subsequent encounter: Secondary | ICD-10-CM

## 2021-03-25 NOTE — Patient Instructions (Signed)
Medication Instructions:  No changes  If you need a refill on your cardiac medications before your next appointment, please call your pharmacy.    Lab work: No new labs needed   If you have labs (blood work) drawn today and your tests are completely normal, you will receive your results only by: . MyChart Message (if you have MyChart) OR . A paper copy in the mail If you have any lab test that is abnormal or we need to change your treatment, we will call you to review the results.   Testing/Procedures: No new testing needed   Follow-Up: At [facility], you and your health needs are our priority.  As part of our continuing mission to provide you with exceptional heart care, we have created designated Provider Care Teams.  These Care Teams include your primary Cardiologist (physician) and Advanced Practice Providers (APPs -  Physician Assistants and Nurse Practitioners) who all work together to provide you with the care you need, when you need it.  . You will need a follow up appointment in 12 months  . Providers on your designated Care Team:   . Christopher Berge, NP . Ryan Dunn, PA-C . Jacquelyn Visser, PA-C  Any Other Special Instructions Will Be Listed Below (If Applicable).  COVID-19 Vaccine Information can be found at: [email or web address] For questions related to vaccine distribution or appointments, please email [email or web address] or call [phone].     

## 2021-03-25 NOTE — Progress Notes (Signed)
Cardiology Office Note  Date:  03/25/2021   ID:  Peter Castro, DOB: 1946-01-03, MRN: [id number]  PCP:  Peter Castro, Peter Spillers, MD   Chief Complaint  Patient presents with  . 3 month follow up     "doing well." Medications reviewed by the patient verbally.     HPI:  Peter Castro is a 75 y.o. male with a history of: Cerebrovascular accident (CVA) (HCC) Memory loss, speech impairment Atrial fibrillation (HCC), ablation in [place] 2015 No documented recurrence of atrial fibrillation since that time Essential hypertension Hyperlipidemia Former smoker, quit 20 years ago  Who presents for his atrial fibrillation, SDH(spontaneous)/dizziness  Zio 01/2021 with no atrial fibrillation  Walks 3 miles a day, gym No chest pain, no SOB  Denies palpitations, atrial fib sx  Rare dizziness, has meclizine  EKG personally reviewed by myself on todays visit NSR RATE 62 BPM  In hospital Positive COVID-19 PCR-COVID-19 PCR was positive, Was at [facility] when he developed dizziness acutely, Had a fall, no head trauma CT scan:  acute SDH along the falx right tentorium, no significant change on repeat CT scan,  4 mm in thickness along the right tentorium. Adjacent right occipital subdural hemorrhage with slight increase from earlier.   Eliquis was held Concern for spontaneous bleed, not fall, there was no head trauma In hospital monitor for several days, persistent dizziness worse with sitting up in bed, movement of the head, gait instability, high fall risk  Other past medical history reviewed Previously seen in [place] last seen 10/20/17 had cryo ballon pulm vein isolation and antral substrate modifications with cavo tricuspid isthmus flutter ablation 01/09/14 prior to this failed multiple cardioversions and failed antiarrhythmic meds last DCCV 12/12/13 then put on flecainide   Echo 08/18/13 LVEF 40-45 % mild sclerotic aortic valve, mild MR   PMH:   has a past medical history of A-fib  (HCC), Arthritis, Atrial flutter (HCC), GERD (gastroesophageal reflux disease), History of chicken pox, Hyperlipidemia, Hypertension, Loss of memory, Stroke (HCC), Subarachnoid hemorrhage (HCC), and Subdural hematoma (HCC).  PSH:    Past Surgical History:  Procedure Laterality Date  . APPENDECTOMY    . OTHER SURGICAL HISTORY     cryoballon pvi isthmus tricuspid ablation Dr. Kathrynn Castro 01/2014 [place]     Current Outpatient Medications  Medication Sig Dispense Refill  . amLODipine (NORVASC) 5 MG tablet TAKE ONE TABLET BY MOUTH DAILY AS NEEDED 90 tablet 3  . aspirin EC 81 MG tablet Take 81 mg by mouth daily. Swallow whole.    Marland Kitchen atorvastatin (LIPITOR) 10 MG tablet Take 1 tablet (10 mg total) by mouth daily at 6 PM. 90 tablet 3  . carvedilol (COREG) 25 MG tablet TAKE ONE TABLET BY MOUTH TWICE A DAY WITH MEALS 180 tablet 3  . flecainide (TAMBOCOR) 100 MG tablet TAKE ONE TABLET BY MOUTH TWICE A DAY 180 tablet 2  . lisinopril (ZESTRIL) 10 MG tablet Take 1 tablet (10 mg total) by mouth daily. 90 tablet 3  . meclizine (ANTIVERT) 25 MG tablet Take 1 tablet (25 mg total) by mouth 3 (three) times daily as needed for dizziness. 15 tablet 0  . oxyCODONE-acetaminophen (PERCOCET) 5-325 MG tablet Take 1 tablet by mouth every 8 (eight) hours as needed for severe pain. 15 tablet 0   No current facility-administered medications for this visit.     ALLERGIES:   Patient has no known allergies.   SOCIAL HISTORY:  The patient  reports that he has quit smoking. He has never  used smokeless tobacco. He reports current alcohol use. He reports previous drug use.   FAMILY HISTORY:   family history includes Cancer in his father; Dementia in his mother.    REVIEW OF SYSTEMS: Review of Systems  Constitutional: Negative.   Eyes: Negative.   Respiratory: Negative.   Cardiovascular: Negative.  Negative for leg swelling.  Gastrointestinal: Negative.   Genitourinary: Negative.   Musculoskeletal: Negative.    Neurological: Negative.   Psychiatric/Behavioral: Negative.   All other systems reviewed and are negative.   PHYSICAL EXAM: VS:  BP 130/90 (BP Location: Left Arm, Patient Position: Sitting, Cuff Size: Normal)   Pulse 62   Ht 5\' 7"  (1.702 m)   Wt 197 lb 6 oz (89.5 kg)   SpO2 97%   BMI 30.91 kg/m  , BMI Body mass index is 30.91 kg/m. Constitutional:  oriented to person, place, and time. No distress.  HENT:  Head: Grossly normal Eyes:  no discharge. No scleral icterus.  Neck: No JVD, no carotid bruits  Cardiovascular: Regular rate and rhythm, no murmurs appreciated Pulmonary/Chest: Clear to auscultation bilaterally, no wheezes or rails Abdominal: Soft.  no distension.  no tenderness.  Musculoskeletal: Normal range of motion Neurological:  normal muscle tone. Coordination normal. No atrophy Skin: Skin warm and dry Psychiatric: normal affect, pleasant   RECENT LABS: 12/10/2020: ALT 22; BUN 19; Creatinine, Ser 1.06; Hemoglobin 15.8; Platelets 146; Potassium 4.0; Sodium 137    LIPID PANEL: Lab Results  Component Value Date   CHOL 153 10/11/2020   HDL 56.10 10/11/2020   LDLCALC 84 10/11/2020   TRIG 61.0 10/11/2020      WEIGHT: Wt Readings from Last 3 Encounters:  03/25/21 197 lb 6 oz (89.5 kg)  12/25/20 196 lb (88.9 kg)  12/10/20 194 lb (88 kg)      ASSESSMENT AND PLAN:  Paroxysmal atrial fibrillation (HCC) - Plan: EKG 12-Lead Maintaining normal sinus rhythm Zio, no atrial fib On coreg and flecainide No NOAC, since ablation  concern of spontaneous bleed, subdural hematoma  Essential hypertension Blood pressure is well controlled on today's visit. No changes made to the medications.  Cerebrovascular accident (CVA), unspecified mechanism (HCC)  subdural hematoma, spontaneous  Mixed hyperlipidemia continue Lipitor, at goal  Subdural hematoma Appeared to be spontaneous while walking at [facility], leading to dizziness, then a fall Was very unstable on his feet  in the hospital secondary to dizziness unrelated to orthostasis  Eliquis on hold   Total encounter time more than 25 minutes. Greater than 50% was spent in counseling and coordination of care with the patient.    Orders Placed This Encounter  Procedures  . EKG 12-Lead    Signed, [facility], M.D., Ph.D. 03/25/2021  [facility] [place], [place]

## 2021-04-11 ENCOUNTER — Other Ambulatory Visit: Payer: Self-pay

## 2021-04-11 ENCOUNTER — Encounter: Payer: Self-pay | Admitting: Internal Medicine

## 2021-04-11 ENCOUNTER — Ambulatory Visit (INDEPENDENT_AMBULATORY_CARE_PROVIDER_SITE_OTHER): Payer: Medicare HMO | Admitting: Internal Medicine

## 2021-04-11 VITALS — BP 120/84 | HR 63 | Temp 97.6°F | Ht 66.46 in | Wt 193.6 lb

## 2021-04-11 DIAGNOSIS — Z23 Encounter for immunization: Secondary | ICD-10-CM | POA: Diagnosis not present

## 2021-04-11 DIAGNOSIS — H6121 Impacted cerumen, right ear: Secondary | ICD-10-CM | POA: Diagnosis not present

## 2021-04-11 DIAGNOSIS — I10 Essential (primary) hypertension: Secondary | ICD-10-CM | POA: Diagnosis not present

## 2021-04-11 DIAGNOSIS — Z0000 Encounter for general adult medical examination without abnormal findings: Secondary | ICD-10-CM

## 2021-04-11 HISTORY — DX: Encounter for general adult medical examination without abnormal findings: Z00.00

## 2021-04-11 NOTE — Progress Notes (Addendum)
Chief Complaint  Patient presents with  . Follow-up   Annual  1. BP controlled on norvasc 5 mg qd, lis 10 mg qd, coreg 25 mg bid  2. hosp [facility] 2/1-2/6 for fall with SDH on eliquis for afib cards took off of eliquis for now and he did PT/OT x 4-5 x he also was covid +  Going to home [place] 06/2021 to 08/2021   Review of Systems  Constitutional: Negative for weight loss.  HENT: Positive for hearing loss.   Eyes: Negative for blurred vision.  Respiratory: Negative for shortness of breath.   Cardiovascular: Negative for chest pain.  Gastrointestinal: Negative for abdominal pain.  Musculoskeletal: Negative for falls.  Skin: Negative for rash.  Neurological: Negative for dizziness and headaches.  Psychiatric/Behavioral: Negative for depression.   Past Medical History:  Diagnosis Date  . A-fib ([place])    a. 10/2017 s/p cryo balloon pulm vein isolation.  . Arthritis   . Atrial flutter ([place])    a. 01/2014 s/p RFCA.  Marland Kitchen COVID-19    12/2020  . GERD (gastroesophageal reflux disease)   . History of chicken pox   . Hyperlipidemia   . Hypertension   . Loss of memory   . Stroke ([place])    ~2013/14  . Subarachnoid hemorrhage ([place])    a. 12/2020 Trace R occipital SAH in setting of dizziness/fall.  . Subdural hematoma ([place])    a. 12/2020 CT head: 61m R parafalcine/tentorial SDH, trace R occipital SAH following fall in the setting of dizziness.   Past Surgical History:  Procedure Laterality Date  . APPENDECTOMY    . OTHER SURGICAL HISTORY     cryoballon pvi isthmus tricuspid ablation Dr. HBearl Mulberry3/2015 [place]    Family History  Problem Relation Age of Onset  . Dementia Mother   . Cancer Father        lung smoker    Social History   Socioeconomic History  . Marital status: Widowed    Spouse name: Not on file  . Number of children: Not on file  . Years of education: Not on file  . Highest education level: Not on file  Occupational History  . Not on file  Tobacco Use  . Smoking  status: Former SResearch scientist (life sciences) . Smokeless tobacco: Never Used  Vaping Use  . Vaping Use: Never used  Substance and Sexual Activity  . Alcohol use: Yes    Comment: occassionally-wine  . Drug use: Not Currently  . Sexual activity: Not on file  Other Topics Concern  . Not on file  Social History Narrative   [place]   [place]ired    Some college    [place]    Widowed since ~2013/14    Owns guns    Wears seat belt    Safe in relationship    Social Determinants of HRadio broadcast assistantStrain: Not on fComcastInsecurity: Not on file  Transportation Needs: Not on file  Physical Activity: Not on file  Stress: Not on file  Social Connections: Not on file  Intimate Partner Violence: Not on file   Current Meds  Medication Sig  . amLODipine (NORVASC) 5 MG tablet TAKE ONE TABLET BY MOUTH DAILY AS NEEDED  . aspirin EC 81 MG tablet Take 81 mg by mouth daily. Swallow whole.  .Marland Kitchenatorvastatin (LIPITOR) 10 MG tablet Take 1 tablet (10 mg total) by mouth daily at 6  PM.  . carvedilol (COREG) 25 MG tablet TAKE ONE TABLET BY MOUTH TWICE A DAY WITH MEALS  . flecainide (TAMBOCOR) 100 MG tablet TAKE ONE TABLET BY MOUTH TWICE A DAY  . lisinopril (ZESTRIL) 10 MG tablet Take 1 tablet (10 mg total) by mouth daily.   No Known Allergies No results found for this or any previous visit (from the past 2160 hour(s)). Objective  Body mass index is 30.82 kg/m. Wt Readings from Last 3 Encounters:  04/11/21 193 lb 9.6 oz (87.8 kg)  03/25/21 197 lb 6 oz (89.5 kg)  12/25/20 196 lb (88.9 kg)   Temp Readings from Last 3 Encounters:  04/11/21 97.6 F (36.4 C) (Oral)  12/15/20 98.2 F (36.8 C) (Oral)  12/09/20 98.7 F (37.1 C) (Oral)   BP Readings from Last 3 Encounters:  04/11/21 120/84  03/25/21 130/90  12/25/20 110/78   Pulse Readings from Last 3 Encounters:  04/11/21 63  03/25/21 62  12/25/20 64    Physical Exam Vitals and nursing note  reviewed.  Constitutional:      Appearance: Normal appearance. He is well-developed and well-groomed.  HENT:     Head: Normocephalic and atraumatic.  Cardiovascular:     Rate and Rhythm: Normal rate and regular rhythm.     Heart sounds: Normal heart sounds. No murmur heard.   Pulmonary:     Effort: Pulmonary effort is normal.     Breath sounds: Normal breath sounds.  Abdominal:     Tenderness: There is no abdominal tenderness.  Skin:    General: Skin is warm and dry.  Neurological:     General: No focal deficit present.     Mental Status: He is alert and oriented to person, place, and time. Mental status is at baseline.     Gait: Gait normal.  Psychiatric:        Attention and Perception: Attention and perception normal.        Mood and Affect: Mood and affect normal.        Speech: Speech normal.        Behavior: Behavior normal. Behavior is cooperative.        Thought Content: Thought content normal.        Cognition and Memory: Cognition and memory normal.        Judgment: Judgment normal.     Assessment  Plan  Essential hypertension norvasc 5 mg qd, lis 10 mg qd, coreg 25 mg bid BP controlled  Afib off eliquis due to SDH per cards    Need for pneumococcal vaccination - Plan: Pneumococcal conjugate vaccine 13-valent  Hearing loss of right ear due to cerumen impaction  Consented and did lavage and currette removal right ear only and sx's improved all wax removed  Annual  Flu shot hadutd  Need to check pcp records:  Tdaprx today Prevnargiven today x 1 04/11/21  pna 2308/11/22 shingrixno record 3/3 pfizer consider 4th mmr and hep b immune  Former smoker quit 17 years from 11/2018 smoked x 20 years max 1 ppd  Check PSAelevated with h/o elevation PSA was 5.6 08/2016, PSA 3.480 02/09/18  H/o prediabetes A1C 5.33 02/09/18  Saw urology 01/18/2019  -urology did not rec f/u    Ref. Range 12/16/2018 08:57  PSA Latest Ref Range: 0.10 - 4.00 ng/ml 4.50 (H)    Results for Peter, JOZWIAK "CHRIS" (MRN [id number]) as of 10/22/2020 08:34  Ref. Range 12/16/2018 08:57 10/11/2020 15:52  PSA Latest Ref Range: 0.10 - 4.00 ng/ml  4.50 (H) 5.48 (H)   Colonoscopy never had pt declines disc cologuard to consider in the future  rx cologuard 04/11/21      rec healthy diet and exercise     Provider: Dr. Olivia Mackie McLean-Scocuzza-Internal Medicine

## 2021-04-11 NOTE — Patient Instructions (Addendum)
Consider 4th pfizer vaccine especially 2 weeks before [place]   Tdap vaccine at pharmacy   Pneumococcal Conjugate Vaccine (Prevnar 13) Suspension for Injection-given today What is this medicine? PNEUMOCOCCAL VACCINE (NEU mo KOK al vak SEEN) is a vaccine used to prevent pneumococcus bacterial infections. These bacteria can cause serious infections like pneumonia, meningitis, and blood infections. This vaccine will lower your chance of getting pneumonia. If you do get pneumonia, it can make your symptoms milder and your illness shorter. This vaccine will not treat an infection and will not cause infection. This vaccine is recommended for infants and young children, adults with certain medical conditions, and adults 65 years or older. This medicine may be used for other purposes; ask your health care provider or pharmacist if you have questions. COMMON BRAND NAME(S): Prevnar, Prevnar 13 What should I tell my health care provider before I take this medicine? They need to know if you have any of these conditions:  bleeding problems  fever  immune system problems  an unusual or allergic reaction to pneumococcal vaccine, diphtheria toxoid, other vaccines, latex, other medicines, foods, dyes, or preservatives  pregnant or trying to get pregnant  breast-feeding How should I use this medicine? This vaccine is for injection into a muscle. It is given by a health care professional. A copy of Vaccine Information Statements will be given before each vaccination. Read this sheet carefully each time. The sheet may change frequently. Talk to your pediatrician regarding the use of this medicine in children. While this drug may be prescribed for children as young as 19 weeks old for selected conditions, precautions do apply. Overdosage: If you think you have taken too much of this medicine contact a poison control center or emergency room at once. NOTE: This medicine is only for you. Do not share this  medicine with others. What if I miss a dose? It is important not to miss your dose. Call your doctor or health care professional if you are unable to keep an appointment. What may interact with this medicine?  medicines for cancer chemotherapy  medicines that suppress your immune function  steroid medicines like prednisone or cortisone This list may not describe all possible interactions. Give your health care provider a list of all the medicines, herbs, non-prescription drugs, or dietary supplements you use. Also tell them if you smoke, drink alcohol, or use illegal drugs. Some items may interact with your medicine. What should I watch for while using this medicine? Mild fever and pain should go away in 3 days or less. Report any unusual symptoms to your doctor or health care professional. What side effects may I notice from receiving this medicine? Side effects that you should report to your doctor or health care professional as soon as possible:  allergic reactions like skin rash, itching or hives, swelling of the face, lips, or tongue  breathing problems  confused  fast or irregular heartbeat  fever over 102 degrees F  seizures  unusual bleeding or bruising  unusual muscle weakness Side effects that usually do not require medical attention (report to your doctor or health care professional if they continue or are bothersome):  aches and pains  diarrhea  fever of 102 degrees F or less  headache  irritable  loss of appetite  pain, tender at site where injected  trouble sleeping This list may not describe all possible side effects. Call your doctor for medical advice about side effects. You may report side effects to FDA at 1-800-FDA-1088.  Where should I keep my medicine? This does not apply. This vaccine is given in a clinic, pharmacy, doctor's office, or other health care setting and will not be stored at home. NOTE: This sheet is a summary. It may not cover all  possible information. If you have questions about this medicine, talk to your doctor, pharmacist, or health care provider.  2021 Elsevier/Gold Standard (2014-08-02 10:27:27)

## 2021-04-11 NOTE — Progress Notes (Signed)
Patient presented for ear irrigation due to cerumen impaction. Patient was informed of the possible side effects of having their ear flushed; light headedness, dizziness, nausea, vomiting and rupture ear drum. Items sed or that can be used are the elephant pump, catch basin, ear curettes, hydrogen peroxide (half a bottle for ear irrigation solution), and a stool softener (1-2CC for softening ear wax). Patient has given a verbal consent to have ear irrigation. patient voiced no concerns nor showed any signs of distress during procedure. Ear canal has become visibly clear  

## 2021-04-15 ENCOUNTER — Encounter: Payer: Self-pay | Admitting: Internal Medicine

## 2021-04-15 NOTE — Progress Notes (Signed)
Cologuard sent via online protal

## 2021-05-01 ENCOUNTER — Encounter: Payer: Self-pay | Admitting: Internal Medicine

## 2021-05-07 ENCOUNTER — Telehealth: Payer: Self-pay | Admitting: Internal Medicine

## 2021-05-07 NOTE — Telephone Encounter (Signed)
Unable to leave message for patient to call back and schedule Medicare Annual Wellness Visit (AWV) in office.   If not able to come in office, please offer to do virtually or by telephone.   Due for AWVI  Please schedule at anytime with Nurse Health Advisor.

## 2021-10-15 ENCOUNTER — Ambulatory Visit (INDEPENDENT_AMBULATORY_CARE_PROVIDER_SITE_OTHER): Payer: Medicare HMO | Admitting: Internal Medicine

## 2021-10-15 ENCOUNTER — Other Ambulatory Visit: Payer: Self-pay

## 2021-10-15 ENCOUNTER — Encounter: Payer: Self-pay | Admitting: Internal Medicine

## 2021-10-15 VITALS — BP 122/80 | HR 91 | Temp 96.8°F | Ht 66.46 in | Wt 198.8 lb

## 2021-10-15 DIAGNOSIS — I10 Essential (primary) hypertension: Secondary | ICD-10-CM

## 2021-10-15 DIAGNOSIS — R739 Hyperglycemia, unspecified: Secondary | ICD-10-CM

## 2021-10-15 DIAGNOSIS — R300 Dysuria: Secondary | ICD-10-CM

## 2021-10-15 DIAGNOSIS — I480 Paroxysmal atrial fibrillation: Secondary | ICD-10-CM

## 2021-10-15 DIAGNOSIS — R309 Painful micturition, unspecified: Secondary | ICD-10-CM

## 2021-10-15 DIAGNOSIS — E785 Hyperlipidemia, unspecified: Secondary | ICD-10-CM

## 2021-10-15 DIAGNOSIS — R350 Frequency of micturition: Secondary | ICD-10-CM | POA: Diagnosis not present

## 2021-10-15 DIAGNOSIS — I639 Cerebral infarction, unspecified: Secondary | ICD-10-CM

## 2021-10-15 DIAGNOSIS — Z23 Encounter for immunization: Secondary | ICD-10-CM

## 2021-10-15 DIAGNOSIS — I4891 Unspecified atrial fibrillation: Secondary | ICD-10-CM

## 2021-10-15 LAB — URINALYSIS, ROUTINE W REFLEX MICROSCOPIC
Bilirubin Urine: NEGATIVE
Hgb urine dipstick: NEGATIVE
Ketones, ur: NEGATIVE
Nitrite: NEGATIVE
RBC / HPF: NONE SEEN (ref 0–?)
Specific Gravity, Urine: <=1.005 — AB (ref 1.000–1.030)
Total Protein, Urine: NEGATIVE
Urine Glucose: NEGATIVE
Urobilinogen, UA: 0.2 (ref 0.0–1.0)
pH: 6.5 (ref 5.0–8.0)

## 2021-10-15 LAB — COMPREHENSIVE METABOLIC PANEL
ALT: 22 U/L (ref 0–53)
AST: 20 U/L (ref 0–37)
Albumin: 3.9 g/dL (ref 3.5–5.2)
Alkaline Phosphatase: 81 U/L (ref 39–117)
BUN: 14 mg/dL (ref 6–23)
CO2: 33 mEq/L — ABNORMAL HIGH (ref 19–32)
Calcium: 9.5 mg/dL (ref 8.4–10.5)
Chloride: 101 mEq/L (ref 96–112)
Creatinine, Ser: 1.24 mg/dL (ref 0.40–1.50)
GFR: 57.05 mL/min — ABNORMAL LOW (ref >60.00)
Glucose, Bld: 81 mg/dL (ref 70–99)
Potassium: 4.5 mEq/L (ref 3.5–5.1)
Sodium: 138 mEq/L (ref 135–145)
Total Bilirubin: 0.8 mg/dL (ref 0.2–1.2)
Total Protein: 6.4 g/dL (ref 6.0–8.3)

## 2021-10-15 LAB — LIPID PANEL
Cholesterol: 127 mg/dL (ref 0–200)
HDL: 45.7 mg/dL (ref >39.00)
LDL Cholesterol: 64 mg/dL (ref 0–99)
NonHDL: 81.21
Total CHOL/HDL Ratio: 3
Triglycerides: 84 mg/dL (ref 0.0–149.0)
VLDL: 16.8 mg/dL (ref 0.0–40.0)

## 2021-10-15 LAB — CBC WITH DIFFERENTIAL/PLATELET
Basophils Absolute: 0.1 10*3/uL (ref 0.0–0.1)
Basophils Relative: 0.8 % (ref 0.0–3.0)
Eosinophils Absolute: 0.1 10*3/uL (ref 0.0–0.7)
Eosinophils Relative: 1.5 % (ref 0.0–5.0)
HCT: 49.8 % (ref 39.0–52.0)
Hemoglobin: 17 g/dL (ref 13.0–17.0)
Lymphocytes Relative: 23.5 % (ref 12.0–46.0)
Lymphs Abs: 1.8 10*3/uL (ref 0.7–4.0)
MCHC: 34.1 g/dL (ref 30.0–36.0)
MCV: 92.2 fl (ref 78.0–100.0)
Monocytes Absolute: 0.7 10*3/uL (ref 0.1–1.0)
Monocytes Relative: 8.7 % (ref 3.0–12.0)
Neutro Abs: 5 10*3/uL (ref 1.4–7.7)
Neutrophils Relative %: 65.5 % (ref 43.0–77.0)
Platelets: 153 10*3/uL (ref 150.0–400.0)
RBC: 5.4 Mil/uL (ref 4.22–5.81)
RDW: 14.1 % (ref 11.5–15.5)
WBC: 7.7 10*3/uL (ref 4.0–10.5)

## 2021-10-15 LAB — HEMOGLOBIN A1C: Hgb A1c MFr Bld: 5.6 % (ref 4.6–6.5)

## 2021-10-15 LAB — TSH: TSH: 1.88 u[IU]/mL (ref 0.35–5.50)

## 2021-10-15 MED ORDER — ATORVASTATIN CALCIUM 10 MG PO TABS: 10.0000 mg | ORAL_TABLET | Freq: Every day | ORAL | 3 refills | Status: DC

## 2021-10-15 MED ORDER — AMLODIPINE BESYLATE 5 MG PO TABS: 5.0000 mg | ORAL_TABLET | Freq: Every day | ORAL | 3 refills | Status: DC | PRN

## 2021-10-15 MED ORDER — ASPIRIN EC 81 MG PO TBEC: 81.0000 mg | DELAYED_RELEASE_TABLET | Freq: Every day | ORAL | 3 refills | Status: DC

## 2021-10-15 MED ORDER — LISINOPRIL 10 MG PO TABS: 10.0000 mg | ORAL_TABLET | Freq: Every day | ORAL | 3 refills | Status: DC

## 2021-10-15 MED ORDER — TETANUS-DIPHTH-ACELL PERTUSSIS 5-2.5-18.5 LF-MCG/0.5 IM SUSP
0.5000 mL | Freq: Once | INTRAMUSCULAR | 0 refills | Status: AC
Start: 2021-10-15 — End: 2021-10-15

## 2021-10-15 MED ORDER — CARVEDILOL 25 MG PO TABS
25.0000 mg | ORAL_TABLET | Freq: Two times a day (BID) | ORAL | 3 refills | Status: DC
Start: 2021-10-15 — End: 2022-04-15

## 2021-10-15 NOTE — Progress Notes (Signed)
Chief Complaint  Patient presents with   Follow-up   F/u  1. Afib rate controlled and htn controlled on norvasc 5 mg qd and coreg 25 mg bid lis 10 mg qd hld on lipitor 10 mg qhs  Review of Systems  Constitutional:  Negative for weight loss.  HENT:  Negative for hearing loss.   Eyes:  Negative for blurred vision.  Respiratory:  Negative for shortness of breath.   Cardiovascular:  Negative for chest pain.  Gastrointestinal:  Negative for abdominal pain and blood in stool.  Genitourinary:  Positive for dysuria and frequency.  Musculoskeletal:  Negative for back pain.  Skin:  Negative for rash.  Neurological:  Negative for headaches.  Psychiatric/Behavioral:  Negative for depression.   Past Medical History:  Diagnosis Date   A-fib ([place])    a. 10/2017 s/p cryo balloon pulm vein isolation.   Arthritis    Atrial flutter ([place])    a. 01/2014 s/p RFCA.   COVID-19    12/2020   GERD (gastroesophageal reflux disease)    History of chicken pox    Hyperlipidemia    Hypertension    Loss of memory    Stroke ([place])    ~2013/14   Subarachnoid hemorrhage ([place])    a. 12/2020 Trace R occipital SAH in setting of dizziness/fall.   Subdural hematoma    a. 12/2020 CT head: 28m R parafalcine/tentorial SDH, trace R occipital SAH following fall in the setting of dizziness.   Past Surgical History:  Procedure Laterality Date   APPENDECTOMY     OTHER SURGICAL HISTORY     cryoballon pvi isthmus tricuspid ablation Dr. HBearl Mulberry3/2015 [place]    Family History  Problem Relation Age of Onset   Dementia Mother    Cancer Father        lung smoker    Social History   Socioeconomic History   Marital status: Widowed    Spouse name: Not on file   Number of children: Not on file   Years of education: Not on file   Highest education level: Not on file  Occupational History   Not on file  Tobacco Use   Smoking status: Former   Smokeless tobacco: Never  Vaping Use   Vaping Use: Never used   Substance and Sexual Activity   Alcohol use: Yes    Comment: occassionally-wine   Drug use: Not Currently   Sexual activity: Not on file  Other Topics Concern   Not on file  Social History Narrative   [place]   Retired    Some college    [place] and 2 grandkids    Widowed since ~2013/14    Owns guns    Wears seat belt    Safe in relationship    Social Determinants of HRadio broadcast assistantStrain: Not on file  Food Insecurity: Not on file  Transportation Needs: Not on file  Physical Activity: Not on file  Stress: Not on file  Social Connections: Not on file  Intimate Partner Violence: Not on file   Current Meds  Medication Sig   flecainide (TAMBOCOR) 100 MG tablet TAKE ONE TABLET BY MOUTH TWICE A DAY   Tdap (BOOSTRIX) 5-2.5-18.5 LF-MCG/0.5 injection Inject 0.5 mLs into the muscle once for 1 dose.   [DISCONTINUED] amLODipine (NORVASC) 5 MG tablet TAKE ONE TABLET BY MOUTH DAILY AS NEEDED   [DISCONTINUED] aspirin EC 81 MG tablet Take 81 mg by  mouth daily. Swallow whole.   [DISCONTINUED] atorvastatin (LIPITOR) 10 MG tablet Take 1 tablet (10 mg total) by mouth daily at 6 PM.   [DISCONTINUED] carvedilol (COREG) 25 MG tablet TAKE ONE TABLET BY MOUTH TWICE A DAY WITH MEALS   [DISCONTINUED] lisinopril (ZESTRIL) 10 MG tablet Take 1 tablet (10 mg total) by mouth daily.   No Known Allergies No results found for this or any previous visit (from the past 2160 hour(s)). Objective  Body mass index is 31.64 kg/m. Wt Readings from Last 3 Encounters:  10/15/21 198 lb 12.8 oz (90.2 kg)  04/11/21 193 lb 9.6 oz (87.8 kg)  03/25/21 197 lb 6 oz (89.5 kg)   Temp Readings from Last 3 Encounters:  10/15/21 (!) 96.8 F (36 C) (Temporal)  04/11/21 97.6 F (36.4 C) (Oral)  12/15/20 98.2 F (36.8 C) (Oral)   BP Readings from Last 3 Encounters:  10/15/21 122/80  04/11/21 120/84  03/25/21 130/90   Pulse Readings from Last 3 Encounters:  10/15/21  91  04/11/21 63  03/25/21 62    Physical Exam Vitals and nursing note reviewed.  Constitutional:      Appearance: Normal appearance. He is well-developed and well-groomed. He is obese.  HENT:     Head: Normocephalic and atraumatic.  Eyes:     Conjunctiva/sclera: Conjunctivae normal.     Pupils: Pupils are equal, round, and reactive to light.  Cardiovascular:     Rate and Rhythm: Normal rate and regular rhythm.     Heart sounds: Normal heart sounds.  Pulmonary:     Effort: Pulmonary effort is normal. No respiratory distress.     Breath sounds: Normal breath sounds.  Abdominal:     Tenderness: There is no abdominal tenderness.  Skin:    General: Skin is warm and moist.  Neurological:     General: No focal deficit present.     Mental Status: He is alert and oriented to person, place, and time. Mental status is at baseline.     Sensory: Sensation is intact.     Motor: Motor function is intact.     Coordination: Coordination is intact.     Gait: Gait is intact. Gait normal.  Psychiatric:        Attention and Perception: Attention and perception normal.        Mood and Affect: Mood and affect normal.        Speech: Speech normal.        Behavior: Behavior normal. Behavior is cooperative.        Thought Content: Thought content normal.        Cognition and Memory: Cognition and memory normal.        Judgment: Judgment normal.    Assessment  Plan  Painful urination - Plan: Urine Culture, Urinalysis Urinary frequency - Plan: Urine Culture, Urinalysis   Paroxysmal atrial fibrillation (HCC) F/u cardiology due by 03/2022  Dr. Rockey Situ  Cont meds rx flecanide via cards    Essential hypertension controlled - Plan: lisinopril (ZESTRIL) 10 MG tablet, carvedilol (COREG) 25 MG tablet, atorvastatin (LIPITOR) 10 MG tablet, Comprehensive metabolic panel, Lipid panel, CBC with Differential/Platelet  Hyperlipidemia, unspecified hyperlipidemia type Lipitor 10 mg qhs  Hyperglycemia -  Plan: Hemoglobin A1c   HM Flu shot given today  Need to check pcp records:  Tdap rx today Prevnar given today x 1 04/11/21  pna 23 08/09/13  shingrix no record consider  4/4 consider 5th  mmr and hep b immune   Former  smoker quit 17 years from 11/2018 smoked x 20 years max 1 ppd  Check PSA elevated with h/o elevation PSA was 5.6 08/2016, PSA 3.480 02/09/18    Saw urology 01/18/2019  -urology did not rec f/u      Ref. Range 12/16/2018 08:57  PSA Latest Ref Range: 0.10 - 4.00 ng/ml 4.50 (H)     Results for Peter Castro, Peter "CHRIS" (MRN [id number]) as of 10/22/2020 08:34   Ref. Range 12/16/2018 08:57 10/11/2020 15:52  PSA Latest Ref Range: 0.10 - 4.00 ng/ml 4.50 (H) 5.48 (H)    Colonoscopy never had pt declines disc cologuard to consider in the future   rx cologuard 04/11/21      rec healthy diet and exercise   Provider: Dr. Olivia Mackie McLean-Scocuzza-Internal Medicine

## 2021-10-15 NOTE — Patient Instructions (Addendum)
Consider 5th covid 19 pfizer dose think about January [employer]  Call and let us know the date you get this please   Tdap vaccine recommended   Burning can try AZO brand for UTI pyridium this is over the counter   Call for appointment  Dossie Arbour, M.D., Ph.D. 03/25/2021  [facility] [place], [place] [phone] Tdap (Tetanus, Diphtheria, Pertussis) Vaccine: What You Need to Know 1. Why get vaccinated? Tdap vaccine can prevent tetanus, diphtheria, and pertussis. Diphtheria and pertussis spread from person to person. Tetanus enters the body through cuts or wounds. TETANUS (T) causes painful stiffening of the muscles. Tetanus can lead to serious health problems, including being unable to open the mouth, having trouble swallowing and breathing, or death. DIPHTHERIA (D) can lead to difficulty breathing, heart failure, paralysis, or death. PERTUSSIS (aP), also known as "whooping cough," can cause uncontrollable, violent coughing that makes it hard to breathe, eat, or drink. Pertussis can be extremely serious especially in babies and young children, causing pneumonia, convulsions, brain damage, or death. In teens and adults, it can cause weight loss, loss of bladder control, passing out, and rib fractures from severe coughing. 2. Tdap vaccine Tdap is only for children 7 years and older, adolescents, and adults.  Adolescents should receive a single dose of Tdap, preferably at age 7 or 12 years. Pregnant people should get a dose of Tdap during every pregnancy, preferably during the early part of the third trimester, to help protect the newborn from pertussis. Infants are most at risk for severe, life-threatening complications from pertussis. Adults who have never received Tdap should get a dose of Tdap. Also, adults should receive a booster dose of either Tdap or Td (a different vaccine that protects against tetanus and diphtheria but not pertussis) every 10 years, or after 5  years in the case of a severe or dirty wound or burn. Tdap may be given at the same time as other vaccines. 3. Talk with your health care provider Tell your vaccine provider if the person getting the vaccine: Has had an allergic reaction after a previous dose of any vaccine that protects against tetanus, diphtheria, or pertussis, or has any severe, life-threatening allergies Has had a coma, decreased level of consciousness, or prolonged seizures within 7 days after a previous dose of any pertussis vaccine (DTP, DTaP, or Tdap) Has seizures or another nervous system problem Has ever had Guillain-Barr Syndrome (also called "GBS") Has had severe pain or swelling after a previous dose of any vaccine that protects against tetanus or diphtheria In some cases, your health care provider may decide to postpone Tdap vaccination until a future visit. People with minor illnesses, such as a cold, may be vaccinated. People who are moderately or severely ill should usually wait until they recover before getting Tdap vaccine.  Your health care provider can give you more information. 4. Risks of a vaccine reaction Pain, redness, or swelling where the shot was given, mild fever, headache, feeling tired, and nausea, vomiting, diarrhea, or stomachache sometimes happen after Tdap vaccination. People sometimes faint after medical procedures, including vaccination. Tell your provider if you feel dizzy or have vision changes or ringing in the ears.  As with any medicine, there is a very remote chance of a vaccine causing a severe allergic reaction, other serious injury, or death. 5. What if there is a serious problem? An allergic reaction could occur after the vaccinated person leaves the clinic. If you see signs of a severe allergic  reaction (hives, swelling of the face and throat, difficulty breathing, a fast heartbeat, dizziness, or weakness), call 9-1-1 and get the person to the nearest hospital. For other signs that  concern you, call your health care provider.  Adverse reactions should be reported to the Vaccine Adverse Event Reporting System (VAERS). Your health care provider will usually file this report, or you can do it yourself. Visit the VAERS website at www.vaers.LAgents.no or call 774-027-6615. VAERS is only for reporting reactions, and VAERS staff members do not give medical advice. 6. The National Vaccine Injury Compensation Program The [employer] Vaccine Injury Compensation Program (VICP) is a federal program that was created to compensate people who may have been injured by certain vaccines. Claims regarding alleged injury or death due to vaccination have a time limit for filing, which may be as short as two years. Visit the VICP website at SpiritualWord.at or call (438)710-6331 to learn about the program and about filing a claim. 7. How can I learn more? Ask your health care provider. Call your local or state health department. Visit the website of the Food and Drug Administration (FDA) for vaccine package inserts and additional information at FinderList.no. Contact the Centers for Disease Control and Prevention (CDC): Call 670 346 1853 (1-800-CDC-INFO) or Visit CDC's website at PicCapture.uy. Vaccine Information Statement Tdap (Tetanus, Diphtheria, Pertussis) Vaccine (06/14/2020) This information is not intended to replace advice given to you by your health care provider. Make sure you discuss any questions you have with your health care provider. Document Revised: 07/10/2020 Document Reviewed: 07/10/2020 Elsevier Patient Education  2022 [employer].

## 2021-10-17 LAB — URINE CULTURE
MICRO NUMBER:: 12726332
Result:: NO GROWTH
SPECIMEN QUALITY:: ADEQUATE

## 2021-10-21 ENCOUNTER — Telehealth: Payer: Self-pay

## 2021-10-21 NOTE — Telephone Encounter (Signed)
-----   Message from Bevelyn Buckles, MD sent at 10/15/2021  3:54 PM EST ----- Urine appears dirty  If 2 Uas cancel 2nd please keep urine culture

## 2021-10-23 ENCOUNTER — Telehealth: Payer: Self-pay | Admitting: Internal Medicine

## 2021-10-23 ENCOUNTER — Ambulatory Visit: Payer: Medicare HMO | Admitting: Family

## 2021-10-23 NOTE — Telephone Encounter (Signed)
Referral placed.

## 2021-10-23 NOTE — Addendum Note (Signed)
Addended by: Quentin Ore on: 10/23/2021 04:48 PM   Modules accepted: Orders

## 2021-10-23 NOTE — Telephone Encounter (Signed)
Patient came into office with the same issue of burning urine, for the pass 3 weeks. Patient was seen for this issue and urine sample was taken. He would like a referral to urology.

## 2021-10-23 NOTE — Telephone Encounter (Signed)
Patient seen 10/15/21, okay for referral

## 2021-10-29 ENCOUNTER — Other Ambulatory Visit: Payer: Self-pay

## 2021-10-29 ENCOUNTER — Encounter: Payer: Self-pay | Admitting: Urology

## 2021-10-29 ENCOUNTER — Ambulatory Visit: Payer: Medicare HMO | Admitting: Urology

## 2021-10-29 VITALS — BP 140/112 | HR 96 | Ht 66.46 in | Wt 194.6 lb

## 2021-10-29 DIAGNOSIS — R300 Dysuria: Secondary | ICD-10-CM | POA: Diagnosis not present

## 2021-10-29 DIAGNOSIS — Z202 Contact with and (suspected) exposure to infections with a predominantly sexual mode of transmission: Secondary | ICD-10-CM | POA: Diagnosis not present

## 2021-10-29 DIAGNOSIS — R69 Illness, unspecified: Secondary | ICD-10-CM | POA: Diagnosis not present

## 2021-10-29 LAB — URINALYSIS, COMPLETE
Bilirubin, UA: NEGATIVE
Glucose, UA: NEGATIVE
Ketones, UA: NEGATIVE
Nitrite, UA: NEGATIVE
Specific Gravity, UA: 1.025 (ref 1.005–1.030)
Urobilinogen, Ur: 0.2 mg/dL (ref 0.2–1.0)
pH, UA: 7 (ref 5.0–7.5)

## 2021-10-29 LAB — MICROSCOPIC EXAMINATION
RBC, Urine: >30 /hpf — AB (ref 0–2)
WBC, UA: >30 /hpf — AB (ref 0–5)

## 2021-10-29 LAB — BLADDER SCAN AMB NON-IMAGING

## 2021-10-29 MED ORDER — CEFTRIAXONE SODIUM 500 MG IJ SOLR
1.0000 g | Freq: Once | INTRAMUSCULAR | Status: AC
  Administered 2021-10-29: 1 g via INTRAMUSCULAR

## 2021-10-29 MED ORDER — CEFTRIAXONE SODIUM 500 MG IJ SOLR: 500.0000 mg | Freq: Once | INTRAMUSCULAR | Status: DC

## 2021-10-29 MED ORDER — CEFTRIAXONE SODIUM 1 G IJ SOLR: 1.0000 g | Freq: Once | INTRAMUSCULAR | 0 refills | Status: DC

## 2021-10-29 MED ORDER — DOXYCYCLINE HYCLATE 100 MG PO CAPS: 100.0000 mg | ORAL_CAPSULE | Freq: Two times a day (BID) | ORAL | 0 refills | Status: DC

## 2021-10-29 NOTE — Progress Notes (Signed)
° °  10/29/21 8:32 AM   Peter Castro [birth year] [id number]  CC: Dysuria  HPI: 75 year old male who reports about a month of dysuria.  This started about a week after sexual activity [place] with a new partner.  He denies any similar episodes in the past.  He denies any gross hematuria or trouble with his urinary stream.  PVR is normal at 19 mL today.  Recent urine culture with PCP was negative, but STD testing was not sent.  Urinalysis with greater than 30 WBCs, greater than 30 RBCs, moderate bacteria, nitrite negative, 2+ leukocytes.  Will send for G/C, atypicals, and culture.   PMH: Past Medical History:  Diagnosis Date   A-fib (HCC)    a. 10/2017 s/p cryo balloon pulm vein isolation.   Arthritis    Atrial flutter (HCC)    a. 01/2014 s/p RFCA.   COVID-19    12/2020   GERD (gastroesophageal reflux disease)    History of chicken pox    Hyperlipidemia    Hypertension    Loss of memory    Stroke (HCC)    ~2013/14   Subarachnoid hemorrhage (HCC)    a. 12/2020 Trace R occipital SAH in setting of dizziness/fall.   Subdural hematoma    a. 12/2020 CT head: 60mm R parafalcine/tentorial SDH, trace R occipital SAH following fall in the setting of dizziness.    Surgical History: Past Surgical History:  Procedure Laterality Date   APPENDECTOMY     OTHER SURGICAL HISTORY     cryoballon pvi isthmus tricuspid ablation Dr. Kathrynn Ducking 01/2014 [place]     Family History: Family History  Problem Relation Age of Onset   Dementia Mother    Cancer Father        lung smoker     Social History:  reports that he has quit smoking. He has never used smokeless tobacco. He reports current alcohol use. He reports that he does not currently use drugs.  Physical Exam: BP (!) 140/112 (BP Location: Left Arm, Patient Position: Sitting, Cuff Size: Normal)    Pulse 96    Ht 5' 6.46" (1.688 m)    Wt 194 lb 9.6 oz (88.3 kg)    BMI 30.98 kg/m    Constitutional:  Alert and oriented, No acute  distress. Cardiovascular: No clubbing, cyanosis, or edema. Respiratory: Normal respiratory effort, no increased work of breathing. GI: Abdomen is soft, nontender, nondistended, no abdominal masses GU: Phallus with patent meatus, no lesions, testicles 20 cc and descended bilaterally, no masses  Laboratory Data: Reviewed, see HPI  Assessment & Plan:   75 year old male with dysuria 1 week after sexual activity with a new partner overseas, and urinalysis concerning for infection.  Suspect STD based on his history.  Urine sent for G/C, atypical culture, and standard urine culture  Treat today for suspected STD with ceftriaxone IM, doxycycline 100 mg twice daily x7 days which would cover G/C and any atypical infection  Legrand Rams, MD 10/29/2021  [facility] [address] [place], [place] [address] [phone]

## 2021-11-01 LAB — CULTURE, URINE COMPREHENSIVE

## 2021-11-01 LAB — GC/CHLAMYDIA PROBE AMP
Chlamydia trachomatis, NAA: NEGATIVE
Neisseria Gonorrhoeae by PCR: NEGATIVE

## 2021-11-04 LAB — MYCOPLASMA / UREAPLASMA CULTURE
Mycoplasma hominis Culture: NEGATIVE
Ureaplasma urealyticum: NEGATIVE

## 2021-11-06 ENCOUNTER — Other Ambulatory Visit: Payer: Self-pay | Admitting: Cardiovascular Disease

## 2021-11-06 DIAGNOSIS — I4891 Unspecified atrial fibrillation: Secondary | ICD-10-CM

## 2021-11-14 IMAGING — CT CT HEAD W/O CM
3 series · 14 of 47 positions shown, 16 images · non-contrast
Comparison: Head CT earlier today at 0333 hour

CLINICAL DATA: Follow-up subdural hematoma.

EXAM:
CT HEAD WITHOUT CONTRAST
TECHNIQUE: Contiguous axial images were obtained from the base of the skull
through the vertex without intravenous contrast.

[Series 2: head wo · axial · 0.43mm/px · z∈[-162,-37]mm · 8 of 31 slices shown, 10 images]
[im 3/31  brain]
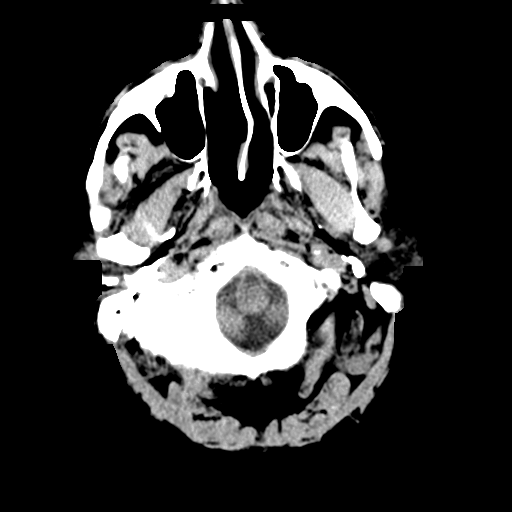
[im 3/31  bone]
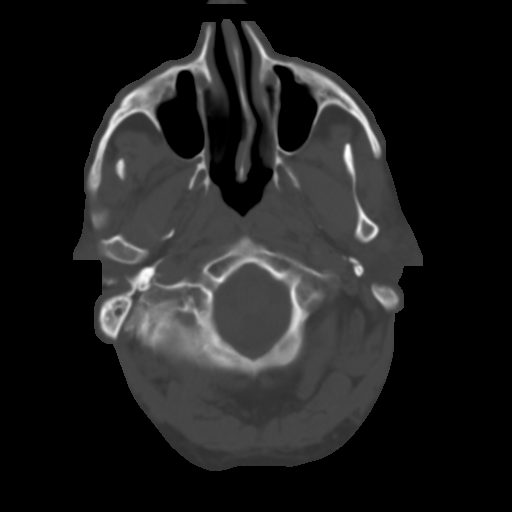
[im 7/31  brain]
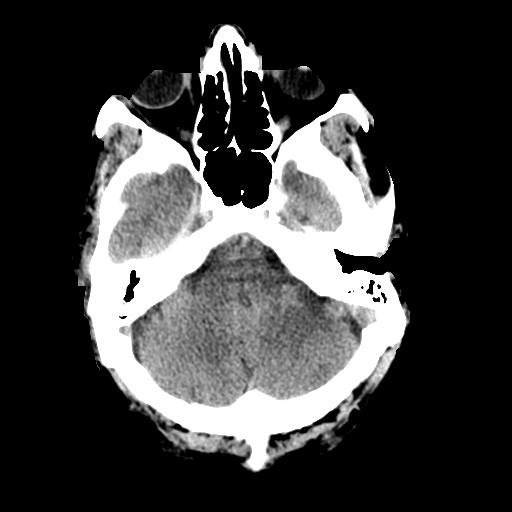
[im 10/31  brain]
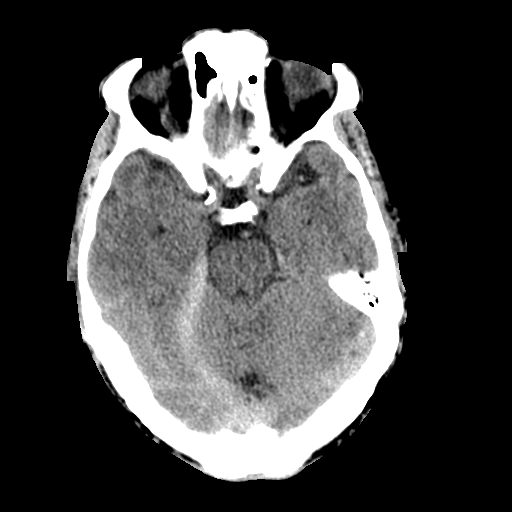
[im 14/31  brain]
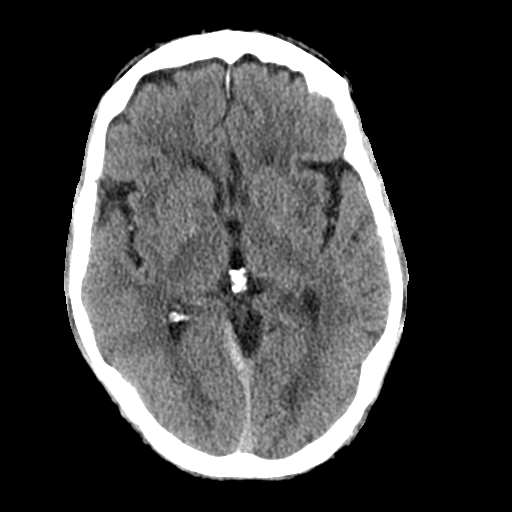
[im 17/31  brain]
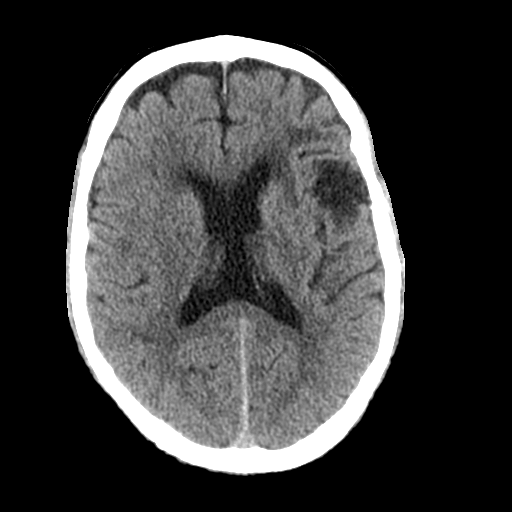
[im 17/31  bone]
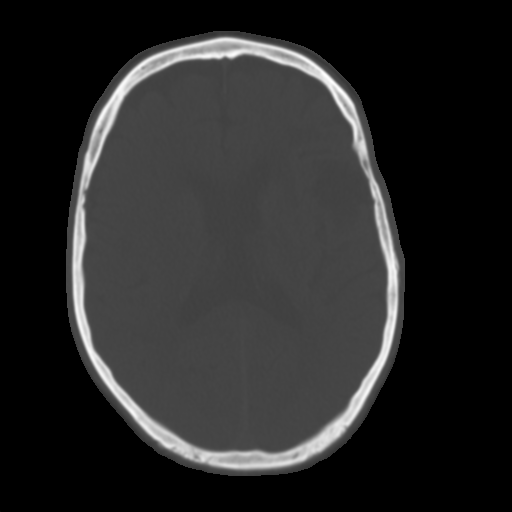
[im 21/31  brain]
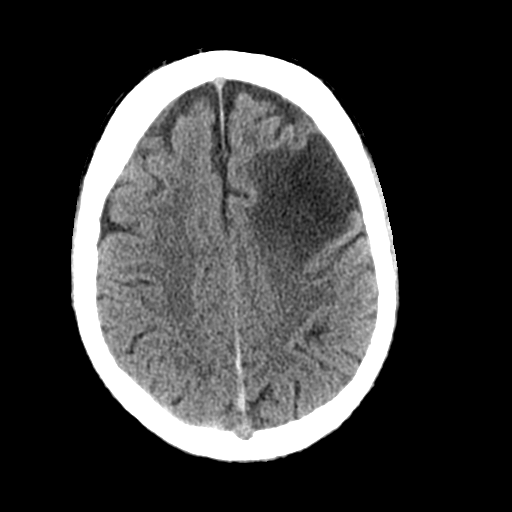
[im 24/31  brain]
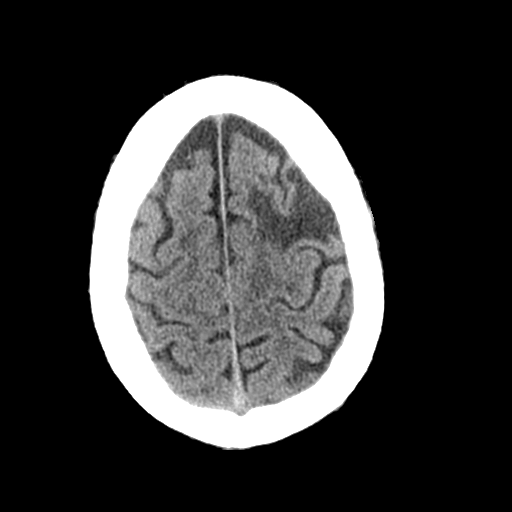
[im 28/31  brain]
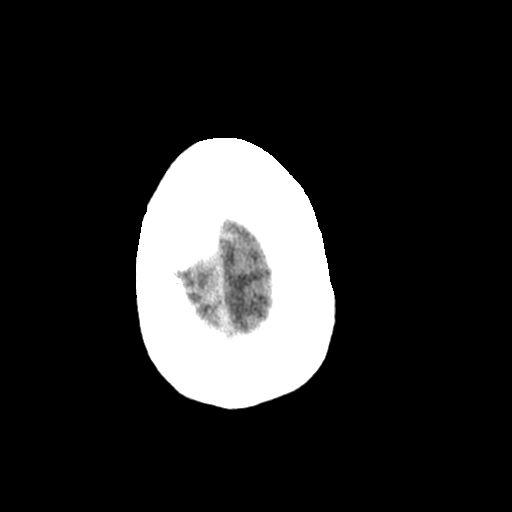

[Series 4: coronal soft tissue · coronal · 0.30mm/px · 3 of 72 slices shown]
[im 24/72  brain]
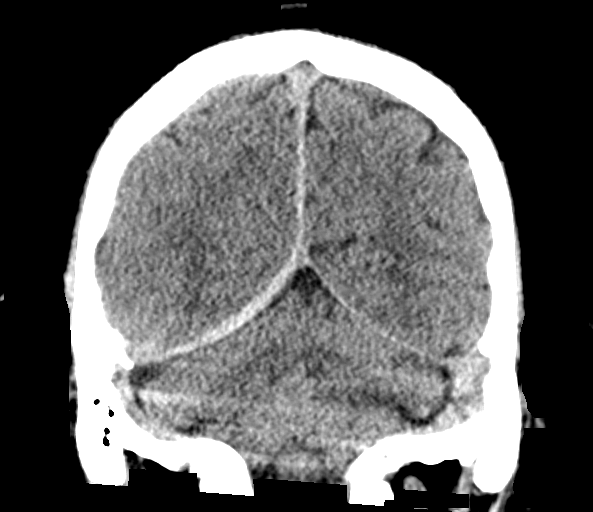
[im 32/72  brain]
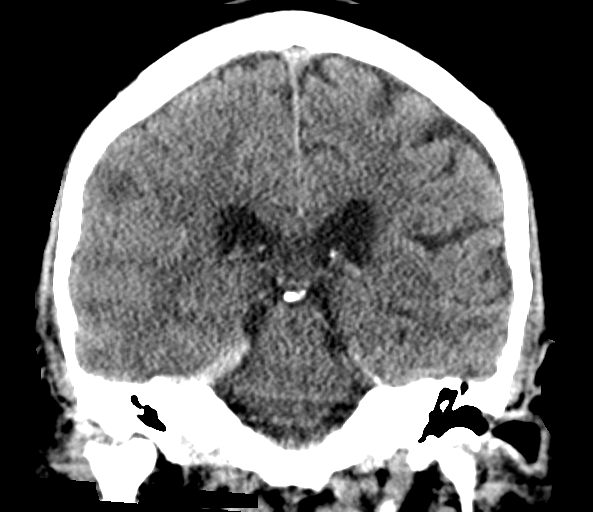
[im 40/72  brain]
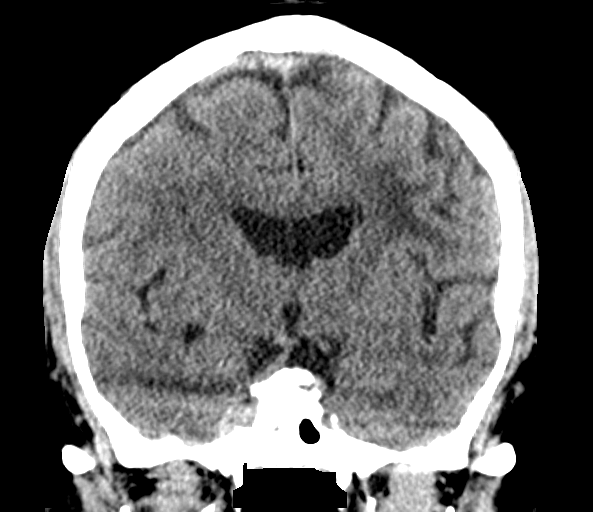

[Series 5: sagittal soft tissue · sagittal · 0.30mm/px · 3 of 59 slices shown]
[im 20/59  brain]
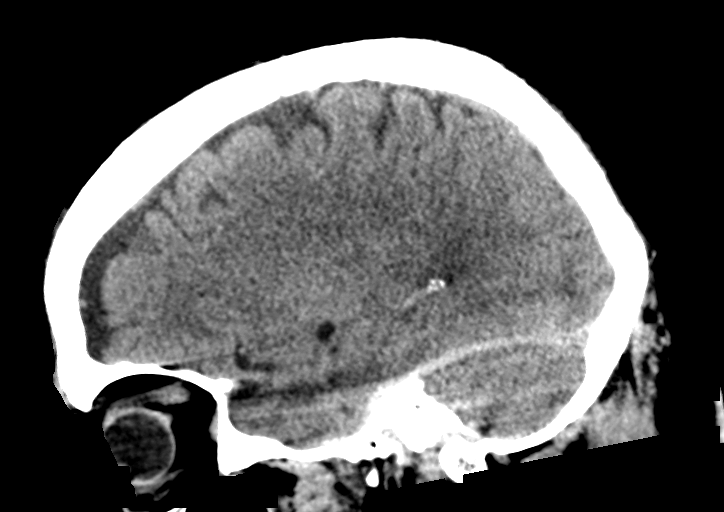
[im 30/59  brain]
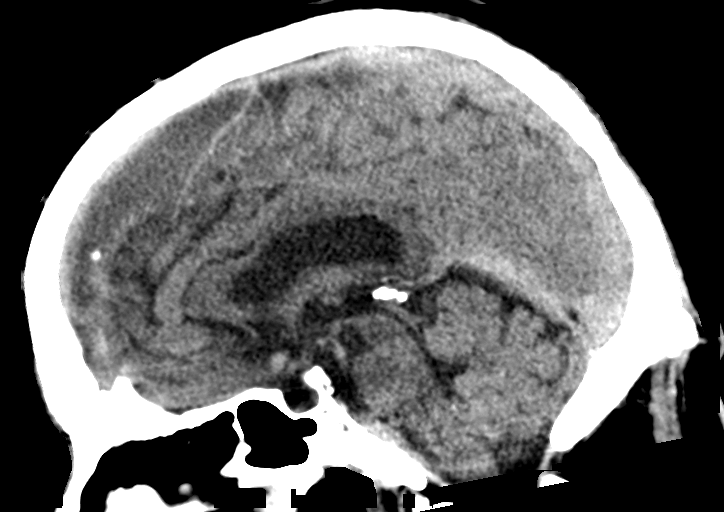
[im 39/59  brain]
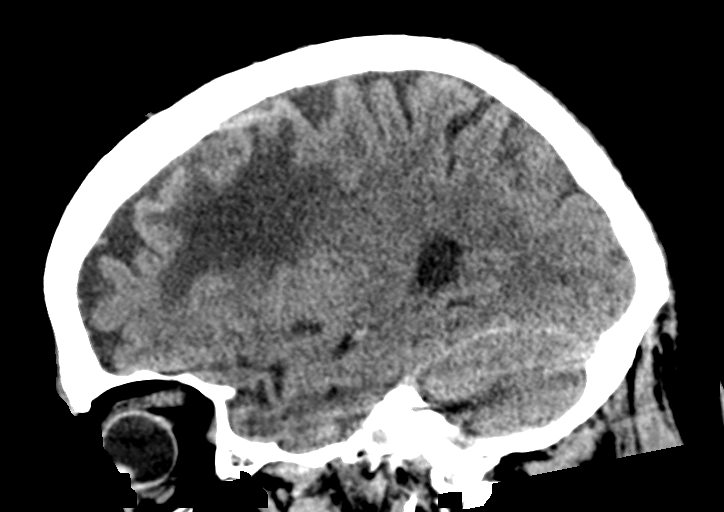

[14 of 47 positions shown; findings below may reference images not displayed]

FINDINGS: Brain: Acute subdural hematoma along the falx and right tentorium,
not significantly changed from earlier today. This measures up to 4
mm in thickness along the right tentorium, measured on series 4,
image 46. Adjacent right occipital subarachnoid hemorrhage with
slight increase from earlier today. No new hemorrhage. High density
nodule in the right frontoparietal white matter measuring 1 cm with
internal calcification, unchanged. No surrounding edema. Remote left
MCA infarct. No evidence of acute ischemia. No hydrocephalus. No
midline shift.

Vascular: Atherosclerosis of skullbase vasculature without
hyperdense vessel or abnormal calcification.

Skull: No fracture or focal lesion.

Sinuses/Orbits: Paranasal sinuses and mastoid air cells are clear.
The visualized orbits are unremarkable.

Other: None.
IMPRESSION: 1. Stable appearance of acute subdural hematoma along the falx and
right tentorium, not significantly changed from earlier today. This
measures up to 4 mm in thickness along the right tentorium.
2. Adjacent right occipital subarachnoid hemorrhage with slight
increase from earlier today.
3. Otherwise stable exam.  Remote left MCA infarct.

## 2021-11-14 IMAGING — MR MR HEAD W/O CM
10 of 11 series · 36 of 48 positions shown · non-contrast
Comparison: Question made with prior CT imaging

CLINICAL DATA: Dizziness, subdural hematoma

EXAM:
MRI HEAD WITHOUT CONTRAST
TECHNIQUE: Multiplanar, multiecho pulse sequences of the brain and surrounding
structures were obtained without intravenous contrast.

[Series 5: ax dwi_tracew · axial · 3.0mm · 0.60mm/px · z∈[-54,+91]mm · 5 of 48 slices shown]
[im 1/48]
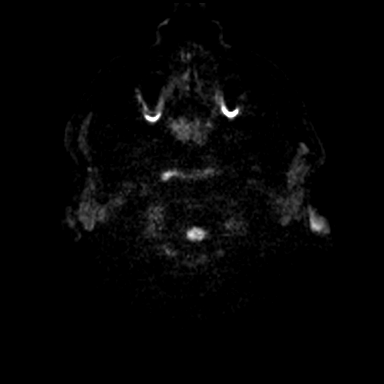
[im 12/48]
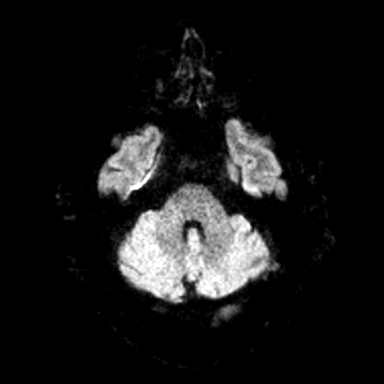
[im 24/48]
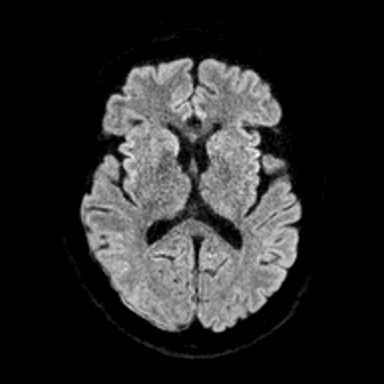
[im 36/48]
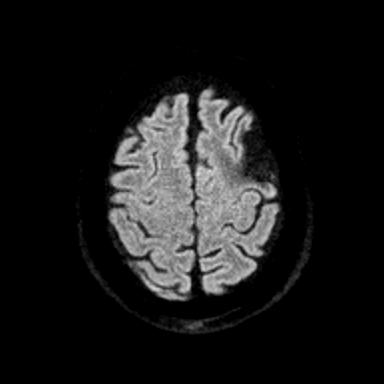
[im 48/48]
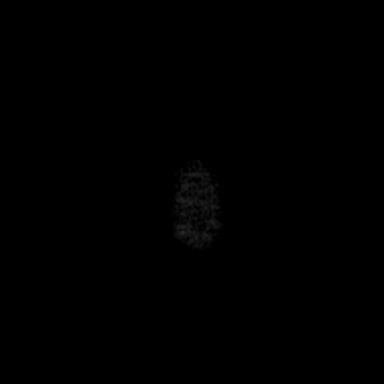

[Series 6: ax dwi_adc · axial · 3.0mm · 0.60mm/px · z∈[-54,+91]mm · 5 of 48 slices shown]
[im 1/48]
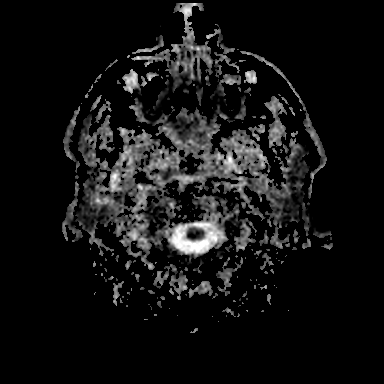
[im 12/48]
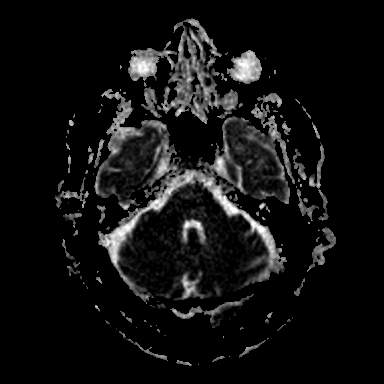
[im 24/48]
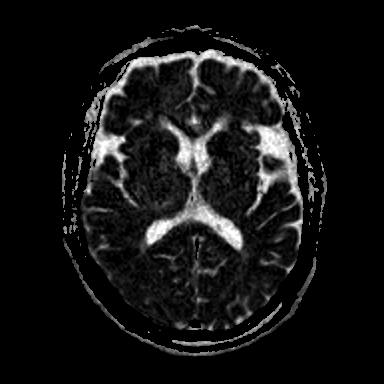
[im 36/48]
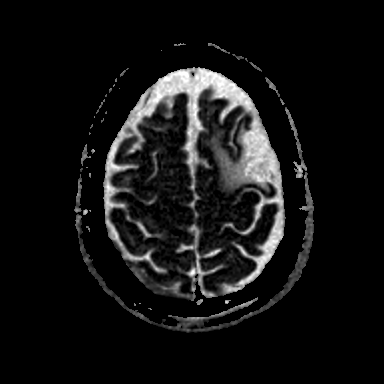
[im 48/48]
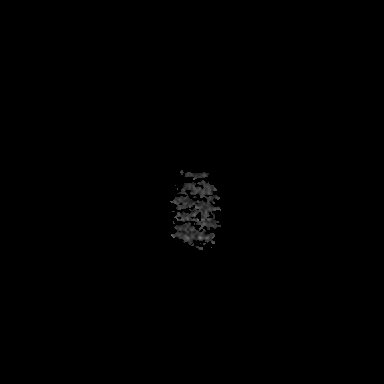

[Series 7: cor dwi_tracew · coronal · 5.0mm · 0.60mm/px · 3 of 38 slices shown]
[im 1/38]
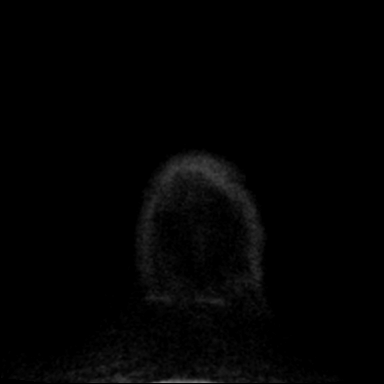
[im 19/38]
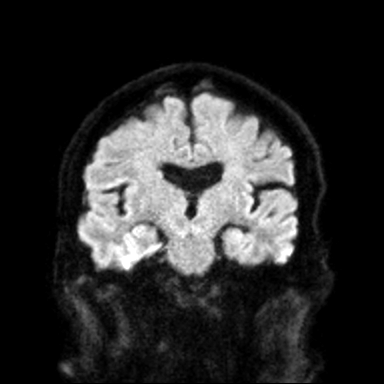
[im 38/38]
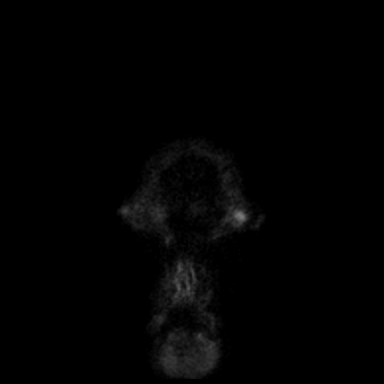

[Series 8: cor dwi_adc · coronal · 5.0mm · 0.60mm/px · 1 of 38 slices shown]
[im 1/38]
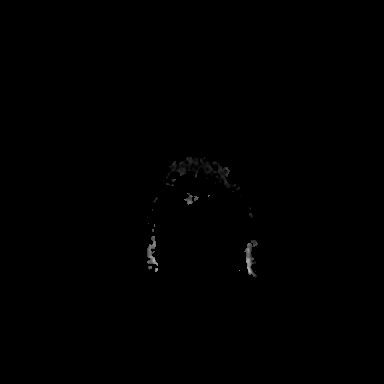

[Series 9: T1 · sagittal · 5.0mm · 0.62mm/px · 2 of 25 slices shown (1 of 2)]
[im 1/25]
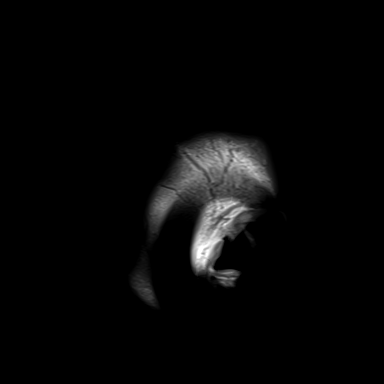
[im 25/25]
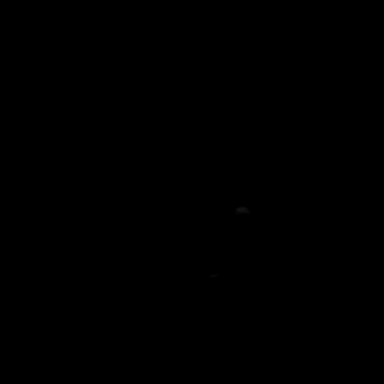

[Series 10: T2 · axial · 5.0mm · 0.53mm/px · z∈[-50,+84]mm · 2 of 25 slices shown (1 of 2)]
[im 1/25]
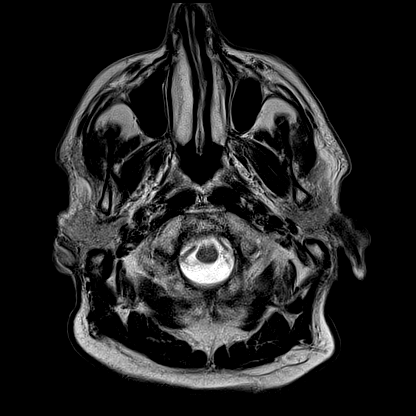
[im 25/25]
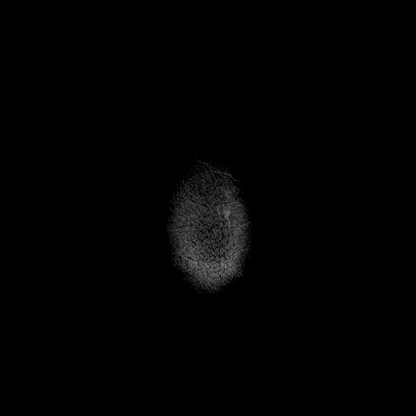

[Series 11: FLAIR · axial · 3.0mm · 0.53mm/px · z∈[-58,+93]mm · 5 of 55 slices shown (1 of 2)]
[im 1/55]
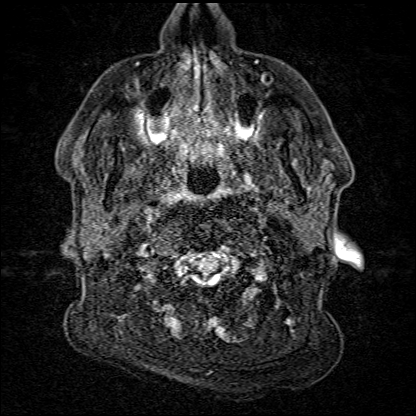
[im 14/55]
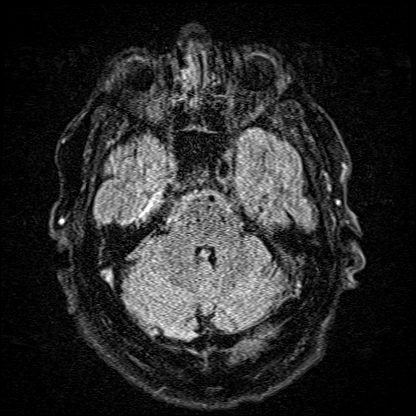
[im 28/55]
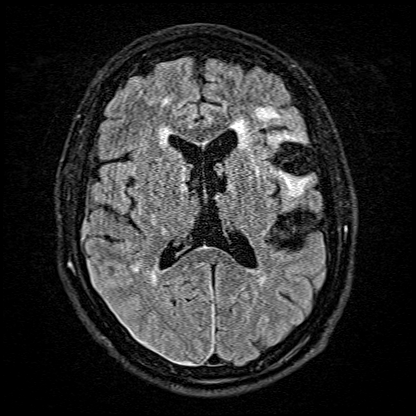
[im 41/55]
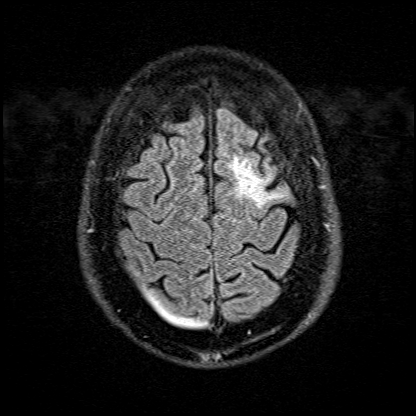
[im 55/55]
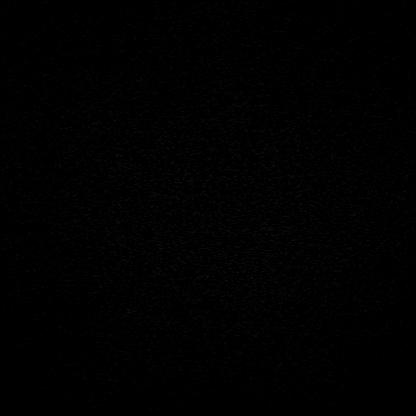

[Series 12: T1 · axial · 1.0mm · 0.98mm/px · z∈[-49,+104]mm · 8 of 176 slices shown (2 of 2)]
[im 12/176]
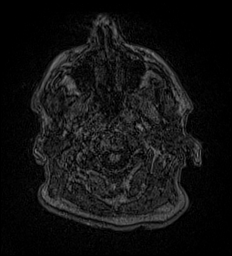
[im 36/176]
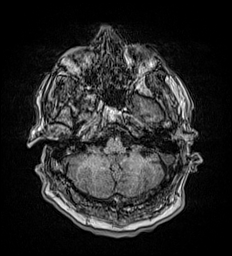
[im 59/176]
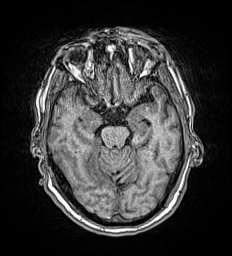
[im 82/176]
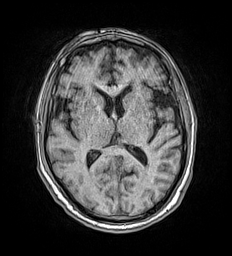
[im 106/176]
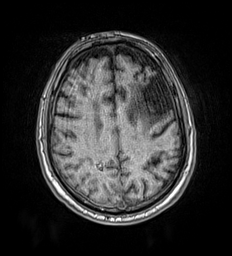
[im 129/176]
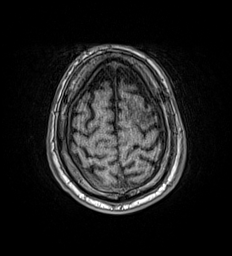
[im 152/176]
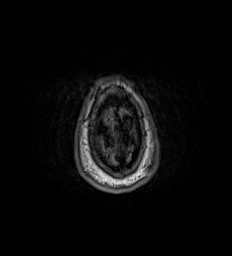
[im 176/176]
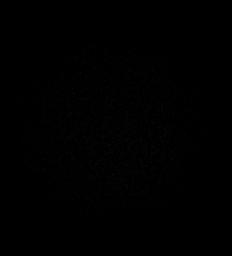

[Series 14: T2 · coronal · 5.0mm · 0.45mm/px · 3 of 31 slices shown (2 of 2)]
[im 1/31]
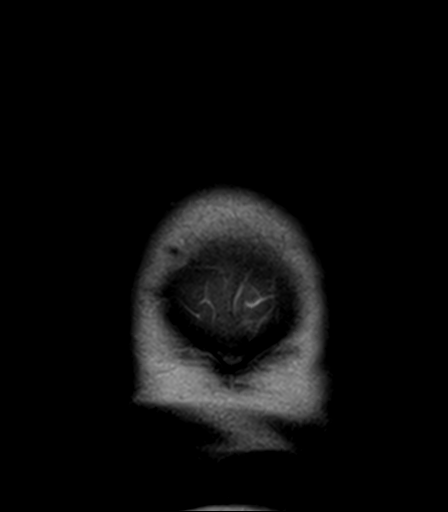
[im 16/31]
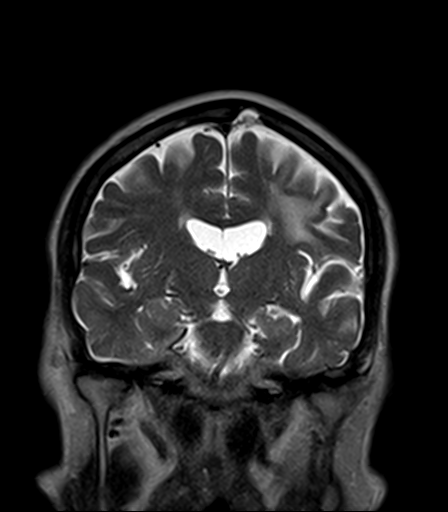
[im 31/31]
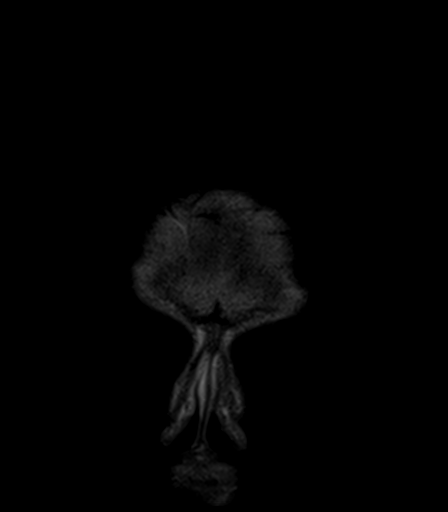

[Series 15: FLAIR · axial · 5.0mm · 1.20mm/px · z∈[-54,+91]mm · 2 of 27 slices shown (2 of 2)]
[im 1/27]
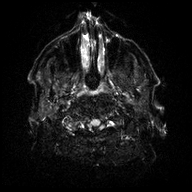
[im 27/27]
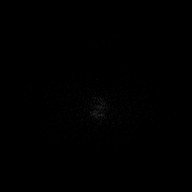

[36 of 48 positions shown; findings below may reference images not displayed]

FINDINGS: Brain: There is no acute infarction. Subdural hemorrhage is present
along the dependent posterior right cerebral convexity, posterior
falx, and right tentorium. There is right occipital sulcal T2 FLAIR
hyperintensity reflecting subarachnoid hemorrhage.

Chronic left frontal infarct with encephalomalacia and gliosis and
chronic blood products.

Heterogeneous 8 mm lesion in the parasagittal right parietal lobe
with corresponding susceptibility is most consistent with a
cavernous malformation. Additional susceptibility hypointensity in
peripheral right temporal lobe likely reflects chronic blood
products.

Additional patchy T2 hyperintensity in the supratentorial white
matter is nonspecific but probably reflects chronic microvascular
ischemic changes.

Vascular: Diminished right sigmoid sinus flow void probably reflects
slow flow. No corresponding abnormal T1 hyperintensity. Major
arterial flow voids at the skull base are preserved.

Skull and upper cervical spine: Normal marrow signal is preserved.

Sinuses/Orbits: Paranasal sinuses are aerated. Orbits are
unremarkable.

Other: Sella is unremarkable.  Mastoid air cells are clear.
IMPRESSION: Small volume subdural hemorrhage along the dependent posterior right
cerebral convexity, posterior falx, and right tentorium. Adjacent
right occipital subarachnoid hemorrhage.

Diminished right sigmoid sinus flow void probably reflects slow
flow. CTV/MRV could be considered for confirmation.

Incidental subcentimeter parasagittal right parietal lobe cavernous
malformation.

Chronic microvascular ischemic changes.

## 2021-12-11 ENCOUNTER — Encounter: Payer: Self-pay | Admitting: Internal Medicine

## 2021-12-11 ENCOUNTER — Other Ambulatory Visit: Payer: Self-pay

## 2021-12-11 ENCOUNTER — Ambulatory Visit (INDEPENDENT_AMBULATORY_CARE_PROVIDER_SITE_OTHER): Payer: Medicare HMO | Admitting: Internal Medicine

## 2021-12-11 VITALS — Ht 66.5 in | Wt 190.0 lb

## 2021-12-11 DIAGNOSIS — J40 Bronchitis, not specified as acute or chronic: Secondary | ICD-10-CM | POA: Diagnosis not present

## 2021-12-11 DIAGNOSIS — J9801 Acute bronchospasm: Secondary | ICD-10-CM

## 2021-12-11 DIAGNOSIS — R059 Cough, unspecified: Secondary | ICD-10-CM | POA: Diagnosis not present

## 2021-12-11 DIAGNOSIS — J069 Acute upper respiratory infection, unspecified: Secondary | ICD-10-CM | POA: Diagnosis not present

## 2021-12-11 MED ORDER — ALBUTEROL SULFATE HFA 108 (90 BASE) MCG/ACT IN AERS: 1.0000 | INHALATION_SPRAY | Freq: Four times a day (QID) | RESPIRATORY_TRACT | 0 refills | Status: DC | PRN

## 2021-12-11 MED ORDER — AZITHROMYCIN 250 MG PO TABS: ORAL_TABLET | ORAL | 0 refills | Status: AC

## 2021-12-11 NOTE — Patient Instructions (Signed)
If not better will order a chest Xray and please call back  Mucinex dm green label for cough or robitussin DM  Multivitamin or below vitamins  Vitamin C 1000 mg daily.  Vitamin D3 4000 Iu (units) daily.  Zinc 100 mg daily.  Quercetin 250-500 mg 2 times per day   Elderberry  Oil of oregano  cepacol or chloroseptic spray if throat is sore   Warm salt water gargles +hydrogen peroxide  Sugar free cough drops I.e Halls or Ricola   Warm tea with honey and lemon   Hydration   Try to eat though you dont feel like it   Tylenol if needed fever body aches   Nasal saline and Flonase 2 sprays nasal congestion  If sneezing/runny nose over the counter allergy pill claritin,allegra, zyrtec, xyzal  Monitor pulse oximeter, buy from [place] if oxygen is less than 90 please go to the hospital.        Are you feeling really sick? Shortness of breath, cough, chest pain?, dizziness? Confusion   If so let me know  If worsening, go to hospital or [facility] clinic Urgent care for further treatment.

## 2021-12-11 NOTE — Progress Notes (Addendum)
Telephone Note  I connected with Peter Castro  on 12/11/21 at  2:00 PM EST by telephone  and verified that I am speaking with the correct person using two identifiers.  Location patient: [place] Location provider:work or home office Persons participating in the virtual visit: patient, provider  I discussed the limitations and requested verbal permission for telemedicine visit. The patient expressed understanding and agreed to proceed.   HPI:  Acute telemedicine visit for : 1 week of productive cough and wheezing not a smoking no fever, fatigue sob, sick contacts tried vicks vapor otc but cough is still present    medical history: see below -Pertinent medication allergies:No Known Allergies -COVID-19 vaccine status:  Immunization History  Administered Date(s) Administered   Fluad Quad(high Dose 65+) 08/03/2019, 09/13/2020, 10/15/2021   PFIZER Comirnaty(Gray Top)Covid-19 Tri-Sucrose Vaccine 06/02/2021   PFIZER(Purple Top)SARS-COV-2 Vaccination 12/22/2019, 01/17/2020, 09/20/2020   Pneumococcal Conjugate-13 04/11/2021     ROS: See pertinent positives and negatives per HPI.  Past Medical History:  Diagnosis Date   A-fib (HCC)    a. 10/2017 s/p cryo balloon pulm vein isolation.   Arthritis    Atrial flutter (HCC)    a. 01/2014 s/p RFCA.   COVID-19    12/2020   GERD (gastroesophageal reflux disease)    History of chicken pox    Hyperlipidemia    Hypertension    Loss of memory    Stroke (HCC)    ~2013/14   Subarachnoid hemorrhage (HCC)    a. 12/2020 Trace R occipital SAH in setting of dizziness/fall.   Subdural hematoma    a. 12/2020 CT head: 65mm R parafalcine/tentorial SDH, trace R occipital SAH following fall in the setting of dizziness.    Past Surgical History:  Procedure Laterality Date   APPENDECTOMY     OTHER SURGICAL HISTORY     cryoballon pvi isthmus tricuspid ablation Dr. Kathrynn Ducking 01/2014 [place]      Current Outpatient Medications:    albuterol (VENTOLIN HFA) 108 (90  Base) MCG/ACT inhaler, Inhale 1-2 puffs into the lungs every 6 (six) hours as needed for wheezing or shortness of breath., Disp: 18 g, Rfl: 0   amLODipine (NORVASC) 5 MG tablet, Take 1 tablet (5 mg total) by mouth daily as needed., Disp: 90 tablet, Rfl: 3   aspirin EC 81 MG tablet, Take 1 tablet (81 mg total) by mouth daily. Swallow whole., Disp: 90 tablet, Rfl: 3   atorvastatin (LIPITOR) 10 MG tablet, Take 1 tablet (10 mg total) by mouth daily at 6 PM., Disp: 90 tablet, Rfl: 3   azithromycin (ZITHROMAX) 250 MG tablet, With food Take 2 tablets on day 1, then 1 tablet daily on days 2 through 5, Disp: 6 tablet, Rfl: 0   carvedilol (COREG) 25 MG tablet, Take 1 tablet (25 mg total) by mouth 2 (two) times daily with a meal., Disp: 180 tablet, Rfl: 3   flecainide (TAMBOCOR) 100 MG tablet, TAKE ONE TABLET BY MOUTH TWICE A DAY, Disp: 180 tablet, Rfl: 1   lisinopril (ZESTRIL) 10 MG tablet, Take 1 tablet (10 mg total) by mouth daily., Disp: 90 tablet, Rfl: 3   doxycycline (VIBRAMYCIN) 100 MG capsule, Take 1 capsule (100 mg total) by mouth 2 (two) times daily. (Patient not taking: Reported on 12/11/2021), Disp: 14 capsule, Rfl: 0  EXAM:  VITALS per patient if applicable:  GENERAL: alert, oriented, appears well and in no acute distress   PSYCH/NEURO: pleasant and cooperative, no obvious depression or anxiety, speech and thought processing grossly  intact  ASSESSMENT AND PLAN:  Discussed the following assessment and plan:  Cough, ?bronchitis Plan: azithromycin (ZITHROMAX) 250 MG tablet, albuterol (VENTOLIN HFA) 108 (90 Base) MCG/ACT inhaler  Consider cxr if not getting better   -we discussed possible serious and likely etiologies, options for evaluation and workup, limitations of telemedicine visit vs in person visit, treatment, treatment risks and precautions. Pt is agreeable to treatment via telemedicine at this moment.  I discussed the assessment and treatment plan with the patient. The patient was  provided an opportunity to ask questions and all were answered. The patient agreed with the plan and demonstrated an understanding of the instructions.    Time spent 20 min Bevelyn Buckles, MD

## 2021-12-11 NOTE — Progress Notes (Signed)
Patient has productive cough with green mucous. Onset Monday.  No one around him sick.

## 2022-03-24 NOTE — Progress Notes (Signed)
Cardiology Office Note  Date:  03/27/2022   ID:  Peter Castro, DOB: [birth year], MRN: [id number]  PCP:  McLean-Scocuzza, Pasty Spillersracy N, MD   Chief Complaint  Patient presents with   12 month follow up     "Doing well." Medications reviewed by the patient verbally.     HPI:  Peter Castro is a 76 y.o. male with a history of: Cerebrovascular accident (CVA) (HCC) Memory loss, speech impairment Atrial fibrillation (HCC), ablation in [place] 2015 No documented recurrence of atrial fibrillation since that time Essential hypertension Hyperlipidemia Former smoker, quit 20 years ago  Who presents for his atrial fibrillation, SDH(spontaneous)/dizziness  LOV 5/22 On today's visit reports feeling well with no tachypalpitations, no significant shortness of breath or leg swelling Active at baseline Prior records reviewed [facility] 01/2021 with no atrial fibrillation  Remains on flecainide, not on anticoagulation Anticoagulation was not restarted after prior subdural hematoma  Reports that he is active, walks 3 miles a day, no chest pain or shortness of breath  EKG personally reviewed by myself on todays visit Atrial fibrillation ventricular rate 67 bpm  Echo 2/22  1. Left ventricular ejection fraction, by estimation, is 55 to 60%. The  left ventricle has normal function. The left ventricle has no regional  wall motion abnormalities. Left ventricular diastolic parameters were  normal.   2. Right ventricular systolic function is normal. The right ventricular  size is normal.   3. The mitral valve is normal in structure. Trivial mitral valve  regurgitation. No evidence of mitral stenosis.   4. The aortic valve is grossly normal. Aortic valve regurgitation is not  visualized. No aortic stenosis is present.   In hospital Positive COVID-19  PCR  [facility] ac[facility], Had a fall, unable to exclude head trauma CT scan:  acute SDH along the falx right tentorium, no  significant change on repeat CT scan,  4 mm in thickness along the right tentorium.  Adjacent right occipital subdural hemorrhage with slight increase from earlier.   Eliquis was held Concern for spontaneous bleed, not fall, there was no head trauma In hospital monitor for several days, persistent dizziness worse with sitting up in bed, movement of the head, gait instability, high fall risk  Other past medical history reviewed [place] last seen 10/20/17 had cryo ballon pulm vein isolation and antral substrate modifications with cavo tricuspid isthmus flutter ablation 01/09/14 prior to this failed multiple cardioversions and failed antiarrhythmic meds last DCCV 12/12/13 then put on flecainide   Echo 08/18/13 LVEF 40-45 % mild sclerotic aortic valve, mild MR   PMH:   has a past medical history of A-fib (HCC), Arthritis, Atrial flutter (HCC), COVID-19, GERD (gastroesophageal reflux disease), History of chicken pox, Hyperlipidemia, Hypertension, Loss of memory, Stroke (HCC), Subarachnoid hemorrhage (HCC), and Subdural hematoma (HCC).  PSH:    Past Surgical History:  Procedure Laterality Date   APPENDECTOMY     OTHER SURGICAL HISTORY     cryoballon pvi isthmus tricuspid ablation Dr. Kathrynn DuckingHurwitz 01/2014 [place]     Current Outpatient Medications  Medication Sig Dispense Refill   albuterol (VENTOLIN HFA) 108 (90 Base) MCG/ACT inhaler Inhale 1-2 puffs into the lungs every 6 (six) hours as needed for wheezing or shortness of breath. 18 g 0   amLODipine (NORVASC) 5 MG tablet Take 1 tablet (5 mg total) by mouth daily as needed. 90 tablet 3   aspirin EC 81 MG tablet Take 1 tablet (81 mg total)  by mouth daily. Swallow whole. 90 tablet 3   atorvastatin (LIPITOR) 10 MG tablet Take 1 tablet (10 mg total) by mouth daily at 6 PM. 90 tablet 3   carvedilol (COREG) 25 MG tablet Take 1 tablet (25 mg total) by mouth 2 (two) times daily with a meal. 180 tablet 3   flecainide (TAMBOCOR) 100 MG  tablet TAKE ONE TABLET BY MOUTH TWICE A DAY 180 tablet 1   lisinopril (ZESTRIL) 10 MG tablet Take 1 tablet (10 mg total) by mouth daily. 90 tablet 3   doxycycline (VIBRAMYCIN) 100 MG capsule Take 1 capsule (100 mg total) by mouth 2 (two) times daily. (Patient not taking: Reported on 12/11/2021) 14 capsule 0   No current facility-administered medications for this visit.     ALLERGIES:   Patient has no known allergies.   SOCIAL HISTORY:  The patient  reports that he has quit smoking. He has never used smokeless tobacco. He reports current alcohol use. He reports that he does not currently use drugs.   FAMILY HISTORY:   family history includes Cancer in his father; Dementia in his mother.    REVIEW OF SYSTEMS: Review of Systems  Constitutional: Negative.   Eyes: Negative.   Respiratory: Negative.    Cardiovascular: Negative.  Negative for leg swelling.  Gastrointestinal: Negative.   Genitourinary: Negative.   Musculoskeletal: Negative.   Neurological: Negative.   Psychiatric/Behavioral: Negative.    All other systems reviewed and are negative.  PHYSICAL EXAM: VS:  BP 120/80 (BP Location: Left Arm, Patient Position: Sitting, Cuff Size: Normal)   Pulse 67   Ht 5\' 4"  (1.626 m)   Wt 194 lb (88 kg)   SpO2 98%   BMI 33.30 kg/m  , BMI Body mass index is 33.3 kg/m. Constitutional:  oriented to person, place, and time. No distress.  HENT:  Head: Grossly normal Eyes:  no discharge. No scleral icterus.  Neck: No JVD, no carotid bruits  Cardiovascular: Regular rate and rhythm, no murmurs appreciated Pulmonary/Chest: Clear to auscultation bilaterally, no wheezes or rails Abdominal: Soft.  no distension.  no tenderness.  Musculoskeletal: Normal range of motion Neurological:  normal muscle tone. Coordination normal. No atrophy Skin: Skin warm and dry Psychiatric: normal affect, pleasant  RECENT LABS: 10/15/2021: ALT 22; BUN 14; Creatinine, Ser 1.24; Hemoglobin 17.0; Platelets 153.0;  Potassium 4.5; Sodium 138; TSH 1.88    LIPID PANEL: Lab Results  Component Value Date   CHOL 127 10/15/2021   HDL 45.70 10/15/2021   LDLCALC 64 10/15/2021   TRIG 84.0 10/15/2021      WEIGHT: Wt Readings from Last 3 Encounters:  03/27/22 194 lb (88 kg)  12/11/21 190 lb (86.2 kg)  10/29/21 194 lb 9.6 oz (88.3 kg)      ASSESSMENT AND PLAN:  Paroxysmal atrial fibrillation (HCC) - Plan: EKG 12-Lead Back in atrial fibrillation We will reorder the Zio monitor to determine A-fib burden On coreg and flecainide If persistent atrial fibrillation, he is relatively asymptomatic Cardioversion could be offered though he is asymptomatic Prior history of subdural hematoma after a fall Could elect for rate control, stop flecainide and continue anticoagulation depending on Zio monitor results  Essential hypertension Blood pressure is well controlled on today's visit. No changes made to the medications.  Cerebrovascular accident (CVA), unspecified mechanism (HCC)  subdural hematoma, in the setting of a fall, unclear if spontaneous  Mixed hyperlipidemia continue Lipitor, at goal  Subdural hematoma No symptoms, has recovered Previous evaluation by neurology in the  hospital February 2022 had recommended holding Eliquis only for 1 week before reinitiating Back in atrial fibrillation on today's EKG, will reinstitute Eliquis given atrial fibrillation on EKG   Total encounter time more than 40 minutes. Greater than 50% was spent in counseling and coordination of care with the patient.   No orders of the defined types were placed in this encounter.   Signed, Dossie Arbour, M.D., Ph.D. 03/27/2022  [facility] [place], [place] [phone]

## 2022-03-27 ENCOUNTER — Ambulatory Visit: Payer: Medicare HMO

## 2022-03-27 ENCOUNTER — Ambulatory Visit (INDEPENDENT_AMBULATORY_CARE_PROVIDER_SITE_OTHER): Payer: Medicare HMO | Admitting: Cardiovascular Disease

## 2022-03-27 ENCOUNTER — Encounter: Payer: Self-pay | Admitting: Cardiovascular Disease

## 2022-03-27 VITALS — BP 120/80 | HR 67 | Ht 64.0 in | Wt 194.0 lb

## 2022-03-27 DIAGNOSIS — I480 Paroxysmal atrial fibrillation: Secondary | ICD-10-CM

## 2022-03-27 DIAGNOSIS — E782 Mixed hyperlipidemia: Secondary | ICD-10-CM | POA: Diagnosis not present

## 2022-03-27 DIAGNOSIS — I639 Cerebral infarction, unspecified: Secondary | ICD-10-CM

## 2022-03-27 DIAGNOSIS — I700 Atherosclerosis of aorta: Secondary | ICD-10-CM | POA: Diagnosis not present

## 2022-03-27 DIAGNOSIS — I10 Essential (primary) hypertension: Secondary | ICD-10-CM | POA: Diagnosis not present

## 2022-03-27 DIAGNOSIS — S065XAD Traumatic subdural hemorrhage with loss of consciousness status unknown, subsequent encounter: Secondary | ICD-10-CM | POA: Diagnosis not present

## 2022-03-27 DIAGNOSIS — S065XAA Traumatic subdural hemorrhage with loss of consciousness status unknown, initial encounter: Secondary | ICD-10-CM

## 2022-03-27 MED ORDER — APIXABAN 5 MG PO TABS: 5.0000 mg | ORAL_TABLET | Freq: Two times a day (BID) | ORAL | 3 refills | Status: DC

## 2022-03-27 NOTE — Patient Instructions (Addendum)
Medication Instructions:  Stop aspirin Start eliquis 5 mg twice a day  Medication Samples have been provided to the patient.  Drug name: Eliquis        Strength: 5 mg         Qty: 56   LOT: [id number]   Exp.Date: 4/25  Dosing instructions: Take 1 tablet by mouth twice daily  The patient has been instructed regarding the correct time, dose, and frequency of taking this medication, including desired effects and most common side effects.   If you need a refill on your cardiac medications before your next appointment, please call your pharmacy.   Lab work: No new labs needed  Testing/Procedures:  Your provider has ordered a heart monitor to wear for 14 days. This will be mailed to your home with instructions on placement. Once you have finished the time frame requested, you will return monitor in box provided.     Follow-Up: At [facility], you and your health needs are our priority.  As part of our continuing mission to provide you with exceptional heart care, we have created designated Provider Care Teams.  These Care Teams include your primary Cardiologist (physician) and Advanced Practice Providers (APPs -  Physician Assistants and Nurse Practitioners) who all work together to provide you with the care you need, when you need it.  You will need a follow up appointment in 6 months  Providers on your designated Care Team:   Murray Hodgkins, NP Christell Faith, PA-C Cadence Kathlen Mody, [place]  COVID-19 Vaccine Information can be found at: ShippingScam.co.uk For questions related to vaccine distribution or appointments, please email vaccine@[email or web address] .[email or web address] or call [phone].

## 2022-04-15 ENCOUNTER — Encounter: Payer: Self-pay | Admitting: Internal Medicine

## 2022-04-15 ENCOUNTER — Ambulatory Visit (INDEPENDENT_AMBULATORY_CARE_PROVIDER_SITE_OTHER): Payer: Medicare HMO | Admitting: Internal Medicine

## 2022-04-15 ENCOUNTER — Telehealth: Payer: Self-pay

## 2022-04-15 DIAGNOSIS — I10 Essential (primary) hypertension: Secondary | ICD-10-CM

## 2022-04-15 DIAGNOSIS — R972 Elevated prostate specific antigen [PSA]: Secondary | ICD-10-CM | POA: Diagnosis not present

## 2022-04-15 DIAGNOSIS — I4891 Unspecified atrial fibrillation: Secondary | ICD-10-CM

## 2022-04-15 DIAGNOSIS — Z8673 Personal history of transient ischemic attack (TIA), and cerebral infarction without residual deficits: Secondary | ICD-10-CM

## 2022-04-15 DIAGNOSIS — I639 Cerebral infarction, unspecified: Secondary | ICD-10-CM

## 2022-04-15 LAB — CBC WITH DIFFERENTIAL/PLATELET
Basophils Absolute: 0.1 10*3/uL (ref 0.0–0.1)
Basophils Relative: 0.7 % (ref 0.0–3.0)
Eosinophils Absolute: 0.1 10*3/uL (ref 0.0–0.7)
Eosinophils Relative: 0.9 % (ref 0.0–5.0)
HCT: 51.9 % (ref 39.0–52.0)
Hemoglobin: 17.2 g/dL — ABNORMAL HIGH (ref 13.0–17.0)
Lymphocytes Relative: 28.8 % (ref 12.0–46.0)
Lymphs Abs: 2 10*3/uL (ref 0.7–4.0)
MCHC: 33.1 g/dL (ref 30.0–36.0)
MCV: 92.4 fl (ref 78.0–100.0)
Monocytes Absolute: 0.6 10*3/uL (ref 0.1–1.0)
Monocytes Relative: 9.3 % (ref 3.0–12.0)
Neutro Abs: 4.2 10*3/uL (ref 1.4–7.7)
Neutrophils Relative %: 60.3 % (ref 43.0–77.0)
Platelets: 145 10*3/uL — ABNORMAL LOW (ref 150.0–400.0)
RBC: 5.62 Mil/uL (ref 4.22–5.81)
RDW: 14.7 % (ref 11.5–15.5)
WBC: 7 10*3/uL (ref 4.0–10.5)

## 2022-04-15 LAB — LIPID PANEL
Cholesterol: 152 mg/dL (ref 0–200)
HDL: 52.3 mg/dL (ref >39.00)
LDL Cholesterol: 83 mg/dL (ref 0–99)
NonHDL: 99.45
Total CHOL/HDL Ratio: 3
Triglycerides: 81 mg/dL (ref 0.0–149.0)
VLDL: 16.2 mg/dL (ref 0.0–40.0)

## 2022-04-15 LAB — COMPREHENSIVE METABOLIC PANEL
ALT: 19 U/L (ref 0–53)
AST: 20 U/L (ref 0–37)
Albumin: 4.1 g/dL (ref 3.5–5.2)
Alkaline Phosphatase: 72 U/L (ref 39–117)
BUN: 20 mg/dL (ref 6–23)
CO2: 31 mEq/L (ref 19–32)
Calcium: 9.2 mg/dL (ref 8.4–10.5)
Chloride: 102 mEq/L (ref 96–112)
Creatinine, Ser: 1.2 mg/dL (ref 0.40–1.50)
GFR: 59.13 mL/min — ABNORMAL LOW (ref >60.00)
Glucose, Bld: 86 mg/dL (ref 70–99)
Potassium: 4.4 mEq/L (ref 3.5–5.1)
Sodium: 138 mEq/L (ref 135–145)
Total Bilirubin: 1 mg/dL (ref 0.2–1.2)
Total Protein: 6.7 g/dL (ref 6.0–8.3)

## 2022-04-15 MED ORDER — APIXABAN 5 MG PO TABS: 5.0000 mg | ORAL_TABLET | Freq: Two times a day (BID) | ORAL | 3 refills | Status: AC

## 2022-04-15 MED ORDER — CARVEDILOL 25 MG PO TABS: 25.0000 mg | ORAL_TABLET | Freq: Two times a day (BID) | ORAL | 3 refills | Status: DC

## 2022-04-15 MED ORDER — APIXABAN 5 MG PO TABS: 5.0000 mg | ORAL_TABLET | Freq: Two times a day (BID) | ORAL | 0 refills | Status: DC

## 2022-04-15 MED ORDER — LISINOPRIL 10 MG PO TABS: 10.0000 mg | ORAL_TABLET | Freq: Every day | ORAL | 3 refills | Status: DC

## 2022-04-15 MED ORDER — AMLODIPINE BESYLATE 5 MG PO TABS: 5.0000 mg | ORAL_TABLET | Freq: Every day | ORAL | 3 refills | Status: DC | PRN

## 2022-04-15 MED ORDER — ATORVASTATIN CALCIUM 10 MG PO TABS: 10.0000 mg | ORAL_TABLET | Freq: Every day | ORAL | 3 refills | Status: DC

## 2022-04-15 NOTE — Telephone Encounter (Signed)
Lvm for pt to return call in regards to lab results.  Per Dr.Tracy: Kidney function improved  Hemoglobin slightly elevated is he smoking? Make sure drinking enough water  Make sure staying hydrated with water 55-64 ounces daily  Platelets slightly low will monitor not concerned for now  Cholesterol normal

## 2022-04-15 NOTE — Progress Notes (Addendum)
Chief Complaint  Patient presents with   Follow-up    6 mon, denies any concerns or pain   6 month f/u  1. Afib rate controlled and htn needs refills of eliquis 5 mg bid, norvasc 5 mg qd, lipitor 10 mg qd, coreg 25 mg bid, lisinopril 10 mg qd   Review of Systems  Constitutional:  Negative for weight loss.  HENT:  Negative for hearing loss.   Eyes:  Negative for blurred vision.  Respiratory:  Negative for shortness of breath.   Cardiovascular:  Negative for chest pain.  Gastrointestinal:  Negative for abdominal pain and blood in stool.  Musculoskeletal:  Negative for back pain.  Skin:  Negative for rash.  Neurological:  Negative for headaches.  Psychiatric/Behavioral:  Negative for depression.   Past Medical History:  Diagnosis Date   A-fib ([place])    a. 10/2017 s/p cryo balloon pulm vein isolation.   Arthritis    Atrial flutter ([place])    a. 01/2014 s/p RFCA.   COVID-19    12/2020   GERD (gastroesophageal reflux disease)    History of chicken pox    Hyperlipidemia    Hypertension    Loss of memory    Stroke ([place])    ~2013/14   Subarachnoid hemorrhage ([place])    a. 12/2020 Trace R occipital SAH in setting of dizziness/fall.   Subdural hematoma ([place])    a. 12/2020 CT head: 48m R parafalcine/tentorial SDH, trace R occipital SAH following fall in the setting of dizziness.   Past Surgical History:  Procedure Laterality Date   APPENDECTOMY     OTHER SURGICAL HISTORY     cryoballon pvi isthmus tricuspid ablation Dr. HBearl Mulberry3/2015 [place]    Family History  Problem Relation Age of Onset   Dementia Mother    Cancer Father        lung smoker    Social History   Socioeconomic History   Marital status: Widowed    Spouse name: Not on file   Number of children: Not on file   Years of education: Not on file   Highest education level: Not on file  Occupational History   Not on file  Tobacco Use   Smoking status: Former   Smokeless tobacco: Never  Vaping Use   Vaping Use:  Never used  Substance and Sexual Activity   Alcohol use: Yes    Comment: occassionally-wine   Drug use: Not Currently   Sexual activity: Not on file  Other Topics Concern   Not on file  Social History Narrative   [place]   Retired    Some college    [place]    Widowed since ~2013/14    Owns guns    Wears seat belt    Safe in relationship    Social Determinants of HRadio broadcast assistantStrain: Not on file  Food Insecurity: Not on file  Transportation Needs: Not on file  Physical Activity: Not on file  Stress: Not on file  Social Connections: Not on file  Intimate Partner Violence: Not on file   Current Meds  Medication Sig   flecainide (TAMBOCOR) 100 MG tablet TAKE ONE TABLET BY MOUTH TWICE A DAY   [DISCONTINUED] albuterol (VENTOLIN HFA) 108 (90 Base) MCG/ACT inhaler Inhale 1-2 puffs into the lungs every 6 (six) hours as needed for wheezing or shortness of breath.   [DISCONTINUED] amLODipine (NORVASC) 5 MG tablet  Take 1 tablet (5 mg total) by mouth daily as needed.   [DISCONTINUED] apixaban (ELIQUIS) 5 MG TABS tablet Take 1 tablet (5 mg total) by mouth 2 (two) times daily.   [DISCONTINUED] atorvastatin (LIPITOR) 10 MG tablet Take 1 tablet (10 mg total) by mouth daily at 6 PM.   [DISCONTINUED] carvedilol (COREG) 25 MG tablet Take 1 tablet (25 mg total) by mouth 2 (two) times daily with a meal.   [DISCONTINUED] lisinopril (ZESTRIL) 10 MG tablet Take 1 tablet (10 mg total) by mouth daily.   No Known Allergies No results found for this or any previous visit (from the past 2160 hour(s)). Objective  Body mass index is 37.73 kg/m. Wt Readings from Last 3 Encounters:  04/15/22 193 lb 3.2 oz (87.6 kg)  03/27/22 194 lb (88 kg)  12/11/21 190 lb (86.2 kg)   Temp Readings from Last 3 Encounters:  04/15/22 97.8 F (36.6 C) (Oral)  10/15/21 (!) 96.8 F (36 C) (Temporal)  04/11/21 97.6 F (36.4 C) (Oral)   BP Readings  from Last 3 Encounters:  04/15/22 120/80  03/27/22 120/80  10/29/21 (!) 140/112   Pulse Readings from Last 3 Encounters:  04/15/22 76  03/27/22 67  10/29/21 96    Physical Exam Vitals and nursing note reviewed.  Constitutional:      Appearance: Normal appearance. He is well-developed and well-groomed.  HENT:     Head: Normocephalic and atraumatic.  Eyes:     Conjunctiva/sclera: Conjunctivae normal.     Pupils: Pupils are equal, round, and reactive to light.  Cardiovascular:     Rate and Rhythm: Normal rate. Rhythm irregular.     Heart sounds: Normal heart sounds.     Comments: Afib rate controlled Pulmonary:     Effort: Pulmonary effort is normal. No respiratory distress.     Breath sounds: Normal breath sounds.  Abdominal:     Tenderness: There is no abdominal tenderness.  Skin:    General: Skin is warm and moist.  Neurological:     General: No focal deficit present.     Mental Status: He is alert and oriented to person, place, and time. Mental status is at baseline.     Sensory: Sensation is intact.     Motor: Motor function is intact.     Coordination: Coordination is intact.     Gait: Gait is intact. Gait normal.  Psychiatric:        Attention and Perception: Attention and perception normal.        Mood and Affect: Mood and affect normal.        Speech: Speech normal.        Behavior: Behavior normal. Behavior is cooperative.        Thought Content: Thought content normal.        Cognition and Memory: Cognition and memory normal.        Judgment: Judgment normal.    Assessment  Plan  Essential hypertension controlled - Plan: lisinopril (ZESTRIL) 10 MG tablet, carvedilol (COREG) 25 MG tablet bid, atorvastatin (LIPITOR) 10 MG tablet, Comprehensive metabolic panel, Lipid panel, CBC with Differential/Platelet  Atrial fibrillation, rate controlled - Plan: carvedilol (COREG) 25 MG tablet bid Cerebrovascular accident (CVA), unspecified mechanism ([place]) - Plan:  atorvastatin (LIPITOR) 10 MG tablet   HM Flu shot utd Need to check pcp records:  Tdap rx previously Prevnar  04/11/21  pna 23 08/09/13  shingrix no record consider  4/4 consider 5th dose in fall 2032 mmr and hep  b immune   Former smoker quit 17 years from 11/2018 smoked x 20 years max 1 ppd  Check PSA elevated with h/o elevation PSA was 5.6 08/2016, PSA 3.480 02/09/18    Saw urology 01/18/2019  -urology did not rec f/u PSA     Ref. Range 12/16/2018 08:57  PSA Latest Ref Range: 0.10 - 4.00 ng/ml 4.50 (H)     Results for THAI, BURGUENO "CHRIS" (MRN [id number]) as of 10/22/2020 08:34   Ref. Range 12/16/2018 08:57 10/11/2020 15:52  PSA Latest Ref Range: 0.10 - 4.00 ng/ml 4.50 (H) 5.48 (H)    Colonoscopy never had pt declines disc cologuard declines 04/15/22      rec healthy diet and exercise    Provider: Dr. Olivia Mackie McLean-Scocuzza-Internal Medicine

## 2022-04-16 NOTE — Telephone Encounter (Signed)
Called pt x2 lvm x2 for pt to return call in regards to lab results.  Per Dr.Tracy: Kidney function improved  Hemoglobin slightly elevated is he smoking? Make sure drinking enough water  Make sure staying hydrated with water 55-64 ounces daily  Platelets slightly low will monitor not concerned for now  Cholesterol normal

## 2022-05-05 ENCOUNTER — Other Ambulatory Visit: Payer: Self-pay | Admitting: Cardiovascular Disease

## 2022-05-05 DIAGNOSIS — I4891 Unspecified atrial fibrillation: Secondary | ICD-10-CM

## 2022-07-04 ENCOUNTER — Emergency Department: Payer: Medicare HMO

## 2022-07-04 ENCOUNTER — Other Ambulatory Visit: Payer: Self-pay

## 2022-07-04 ENCOUNTER — Observation Stay: Payer: Medicare HMO

## 2022-07-04 ENCOUNTER — Observation Stay
Admission: EM | Admit: 2022-07-04 | Discharge: 2022-07-05 | Disposition: A | Discharge status: Home / Self Care | Payer: Medicare HMO | Attending: Internal Medicine | Admitting: Internal Medicine

## 2022-07-04 DIAGNOSIS — G9340 Encephalopathy, unspecified: Secondary | ICD-10-CM | POA: Diagnosis not present

## 2022-07-04 DIAGNOSIS — K219 Gastro-esophageal reflux disease without esophagitis: Secondary | ICD-10-CM | POA: Diagnosis not present

## 2022-07-04 DIAGNOSIS — I480 Paroxysmal atrial fibrillation: Secondary | ICD-10-CM | POA: Insufficient documentation

## 2022-07-04 DIAGNOSIS — Z8673 Personal history of transient ischemic attack (TIA), and cerebral infarction without residual deficits: Secondary | ICD-10-CM | POA: Insufficient documentation

## 2022-07-04 DIAGNOSIS — I10 Essential (primary) hypertension: Secondary | ICD-10-CM | POA: Diagnosis not present

## 2022-07-04 DIAGNOSIS — U071 COVID-19: Secondary | ICD-10-CM | POA: Diagnosis not present

## 2022-07-04 DIAGNOSIS — Z7901 Long term (current) use of anticoagulants: Secondary | ICD-10-CM | POA: Insufficient documentation

## 2022-07-04 DIAGNOSIS — R413 Other amnesia: Secondary | ICD-10-CM | POA: Diagnosis present

## 2022-07-04 DIAGNOSIS — E785 Hyperlipidemia, unspecified: Secondary | ICD-10-CM | POA: Diagnosis not present

## 2022-07-04 DIAGNOSIS — Z79899 Other long term (current) drug therapy: Secondary | ICD-10-CM | POA: Diagnosis not present

## 2022-07-04 DIAGNOSIS — R42 Dizziness and giddiness: Secondary | ICD-10-CM | POA: Diagnosis present

## 2022-07-04 DIAGNOSIS — G9349 Other encephalopathy: Secondary | ICD-10-CM

## 2022-07-04 DIAGNOSIS — I2510 Atherosclerotic heart disease of native coronary artery without angina pectoris: Secondary | ICD-10-CM | POA: Diagnosis not present

## 2022-07-04 DIAGNOSIS — Z87891 Personal history of nicotine dependence: Secondary | ICD-10-CM | POA: Insufficient documentation

## 2022-07-04 DIAGNOSIS — R4182 Altered mental status, unspecified: Secondary | ICD-10-CM | POA: Diagnosis not present

## 2022-07-04 DIAGNOSIS — R5381 Other malaise: Secondary | ICD-10-CM | POA: Diagnosis not present

## 2022-07-04 DIAGNOSIS — I4891 Unspecified atrial fibrillation: Secondary | ICD-10-CM | POA: Diagnosis not present

## 2022-07-04 DIAGNOSIS — I482 Chronic atrial fibrillation, unspecified: Secondary | ICD-10-CM | POA: Diagnosis present

## 2022-07-04 HISTORY — DX: Paroxysmal atrial fibrillation: I48.0

## 2022-07-04 HISTORY — DX: COVID-19: U07.1

## 2022-07-04 HISTORY — DX: Altered mental status, unspecified: R41.82

## 2022-07-04 LAB — CBC
HCT: 54.9 % — ABNORMAL HIGH (ref 39.0–52.0)
Hemoglobin: 18.4 g/dL — ABNORMAL HIGH (ref 13.0–17.0)
MCH: 29.9 pg (ref 26.0–34.0)
MCHC: 33.5 g/dL (ref 30.0–36.0)
MCV: 89.1 fL (ref 80.0–100.0)
Platelets: 153 10*3/uL (ref 150–400)
RBC: 6.16 MIL/uL — ABNORMAL HIGH (ref 4.22–5.81)
RDW: 14.2 % (ref 11.5–15.5)
WBC: 13.3 10*3/uL — ABNORMAL HIGH (ref 4.0–10.5)
nRBC: 0 % (ref 0.0–0.2)

## 2022-07-04 LAB — BLOOD GAS, VENOUS
Acid-Base Excess: 1.8 mmol/L (ref 0.0–2.0)
Bicarbonate: 25.7 mmol/L (ref 20.0–28.0)
O2 Saturation: 92.2 %
Patient temperature: 37
pCO2, Ven: 37 mmHg — ABNORMAL LOW (ref 44–60)
pH, Ven: 7.45 — ABNORMAL HIGH (ref 7.25–7.43)
pO2, Ven: 64 mmHg — ABNORMAL HIGH (ref 32–45)

## 2022-07-04 LAB — URINE DRUG SCREEN, QUALITATIVE (ARMC ONLY)
Amphetamines, Ur Screen: NOT DETECTED
Barbiturates, Ur Screen: NOT DETECTED
Benzodiazepine, Ur Scrn: NOT DETECTED
Cannabinoid 50 Ng, Ur ~~LOC~~: NOT DETECTED
Cocaine Metabolite,Ur ~~LOC~~: NOT DETECTED
MDMA (Ecstasy)Ur Screen: NOT DETECTED
Methadone Scn, Ur: NOT DETECTED
Opiate, Ur Screen: NOT DETECTED
Phencyclidine (PCP) Ur S: NOT DETECTED
Tricyclic, Ur Screen: NOT DETECTED

## 2022-07-04 LAB — BASIC METABOLIC PANEL
Anion gap: 13 (ref 5–15)
BUN: 19 mg/dL (ref 8–23)
CO2: 22 mmol/L (ref 22–32)
Calcium: 9.1 mg/dL (ref 8.9–10.3)
Chloride: 103 mmol/L (ref 98–111)
Creatinine, Ser: 1.18 mg/dL (ref 0.61–1.24)
GFR, Estimated: >60 mL/min (ref >60)
Glucose, Bld: 113 mg/dL — ABNORMAL HIGH (ref 70–99)
Potassium: 4.2 mmol/L (ref 3.5–5.1)
Sodium: 138 mmol/L (ref 135–145)

## 2022-07-04 LAB — URINALYSIS, ROUTINE W REFLEX MICROSCOPIC
Bilirubin Urine: NEGATIVE
Glucose, UA: NEGATIVE mg/dL
Hgb urine dipstick: NEGATIVE
Ketones, ur: 5 mg/dL — AB
Leukocytes,Ua: NEGATIVE
Nitrite: NEGATIVE
Protein, ur: NEGATIVE mg/dL
Specific Gravity, Urine: 1.02 (ref 1.005–1.030)
pH: 6 (ref 5.0–8.0)

## 2022-07-04 LAB — RESP PANEL BY RT-PCR (FLU A&B, COVID) ARPGX2
Influenza A by PCR: NEGATIVE
Influenza B by PCR: NEGATIVE
SARS Coronavirus 2 by RT PCR: POSITIVE — AB

## 2022-07-04 LAB — GLUCOSE, CAPILLARY
Glucose-Capillary: 105 mg/dL — ABNORMAL HIGH (ref 70–99)
Glucose-Capillary: 109 mg/dL — ABNORMAL HIGH (ref 70–99)
Glucose-Capillary: 131 mg/dL — ABNORMAL HIGH (ref 70–99)
Glucose-Capillary: 207 mg/dL — ABNORMAL HIGH (ref 70–99)

## 2022-07-04 LAB — LACTIC ACID, PLASMA: Lactic Acid, Venous: 1.5 mmol/L (ref 0.5–1.9)

## 2022-07-04 LAB — PHOSPHORUS: Phosphorus: 3.2 mg/dL (ref 2.5–4.6)

## 2022-07-04 LAB — PROTIME-INR
INR: 1.2 (ref 0.8–1.2)
Prothrombin Time: 15.1 seconds (ref 11.4–15.2)

## 2022-07-04 LAB — HEPATIC FUNCTION PANEL
ALT: 16 U/L (ref 0–44)
AST: 23 U/L (ref 15–41)
Albumin: 4.2 g/dL (ref 3.5–5.0)
Alkaline Phosphatase: 71 U/L (ref 38–126)
Bilirubin, Direct: 0.4 mg/dL — ABNORMAL HIGH (ref 0.0–0.2)
Indirect Bilirubin: 1.7 mg/dL — ABNORMAL HIGH (ref 0.3–0.9)
Total Bilirubin: 2.1 mg/dL — ABNORMAL HIGH (ref 0.3–1.2)
Total Protein: 7 g/dL (ref 6.5–8.1)

## 2022-07-04 LAB — TSH: TSH: 0.615 u[IU]/mL (ref 0.350–4.500)

## 2022-07-04 LAB — TROPONIN I (HIGH SENSITIVITY)
Troponin I (High Sensitivity): 21 ng/L — ABNORMAL HIGH (ref <18)
Troponin I (High Sensitivity): 24 ng/L — ABNORMAL HIGH (ref <18)

## 2022-07-04 LAB — PROCALCITONIN: Procalcitonin: <0.1 ng/mL

## 2022-07-04 MED ORDER — INSULIN ASPART 100 UNIT/ML IJ SOLN
0.0000 [IU] | Freq: Three times a day (TID) | INTRAMUSCULAR | Status: DC
  Administered 2022-07-05: 4 [IU] via SUBCUTANEOUS
  Administered 2022-07-05: 3 [IU] via SUBCUTANEOUS
  Filled 2022-07-04 (×2): qty 1

## 2022-07-04 MED ORDER — METHYLPREDNISOLONE SODIUM SUCC 125 MG IJ SOLR
1.0000 mg/kg | Freq: Two times a day (BID) | INTRAMUSCULAR | Status: DC
  Administered 2022-07-04 – 2022-07-05 (×3): 76.875 mg via INTRAVENOUS
  Filled 2022-07-04 (×3): qty 2

## 2022-07-04 MED ORDER — LISINOPRIL 10 MG PO TABS
10.0000 mg | ORAL_TABLET | Freq: Every day | ORAL | Status: DC
  Administered 2022-07-05: 10 mg via ORAL
  Filled 2022-07-04: qty 1

## 2022-07-04 MED ORDER — ONDANSETRON HCL 4 MG PO TABS: 4.0000 mg | ORAL_TABLET | Freq: Four times a day (QID) | ORAL | Status: DC | PRN

## 2022-07-04 MED ORDER — ENOXAPARIN SODIUM 40 MG/0.4ML IJ SOSY: 40.0000 mg | PREFILLED_SYRINGE | Freq: Every day | INTRAMUSCULAR | Status: DC

## 2022-07-04 MED ORDER — PREDNISONE 50 MG PO TABS: 50.0000 mg | ORAL_TABLET | Freq: Every day | ORAL | Status: DC

## 2022-07-04 MED ORDER — ALBUTEROL SULFATE HFA 108 (90 BASE) MCG/ACT IN AERS: 2.0000 | INHALATION_SPRAY | Freq: Four times a day (QID) | RESPIRATORY_TRACT | Status: DC | PRN

## 2022-07-04 MED ORDER — HYDRALAZINE HCL 10 MG PO TABS: 10.0000 mg | ORAL_TABLET | Freq: Four times a day (QID) | ORAL | Status: DC | PRN

## 2022-07-04 MED ORDER — ASCORBIC ACID 500 MG PO TABS
500.0000 mg | ORAL_TABLET | Freq: Every day | ORAL | Status: DC
  Administered 2022-07-04 – 2022-07-05 (×2): 500 mg via ORAL
  Filled 2022-07-04 (×2): qty 1

## 2022-07-04 MED ORDER — FLECAINIDE ACETATE 100 MG PO TABS
100.0000 mg | ORAL_TABLET | Freq: Two times a day (BID) | ORAL | Status: DC
  Administered 2022-07-04 – 2022-07-05 (×2): 100 mg via ORAL
  Filled 2022-07-04 (×3): qty 1

## 2022-07-04 MED ORDER — ACETAMINOPHEN 650 MG RE SUPP: 650.0000 mg | Freq: Four times a day (QID) | RECTAL | Status: DC | PRN

## 2022-07-04 MED ORDER — GUAIFENESIN-DM 100-10 MG/5ML PO SYRP: 10.0000 mL | ORAL_SOLUTION | ORAL | Status: DC | PRN

## 2022-07-04 MED ORDER — SENNOSIDES-DOCUSATE SODIUM 8.6-50 MG PO TABS: 1.0000 | ORAL_TABLET | Freq: Every evening | ORAL | Status: DC | PRN

## 2022-07-04 MED ORDER — CARVEDILOL 25 MG PO TABS
25.0000 mg | ORAL_TABLET | Freq: Two times a day (BID) | ORAL | Status: DC
  Administered 2022-07-04 – 2022-07-05 (×2): 25 mg via ORAL
  Filled 2022-07-04 (×2): qty 1

## 2022-07-04 MED ORDER — ATORVASTATIN CALCIUM 20 MG PO TABS
10.0000 mg | ORAL_TABLET | Freq: Every day | ORAL | Status: DC
  Administered 2022-07-04: 10 mg via ORAL
  Filled 2022-07-04: qty 1

## 2022-07-04 MED ORDER — SODIUM CHLORIDE 0.9 % IV BOLUS
1000.0000 mL | Freq: Once | INTRAVENOUS | Status: AC
  Administered 2022-07-04: 1000 mL via INTRAVENOUS

## 2022-07-04 MED ORDER — NIRMATRELVIR/RITONAVIR (PAXLOVID)TABLET: 3.0000 | ORAL_TABLET | Freq: Two times a day (BID) | ORAL | Status: DC

## 2022-07-04 MED ORDER — ACETAMINOPHEN 325 MG PO TABS: 650.0000 mg | ORAL_TABLET | Freq: Four times a day (QID) | ORAL | Status: DC | PRN

## 2022-07-04 MED ORDER — APIXABAN 5 MG PO TABS
5.0000 mg | ORAL_TABLET | Freq: Two times a day (BID) | ORAL | Status: DC
  Administered 2022-07-04 – 2022-07-05 (×2): 5 mg via ORAL
  Filled 2022-07-04 (×2): qty 1

## 2022-07-04 MED ORDER — ZINC SULFATE 220 (50 ZN) MG PO CAPS
220.0000 mg | ORAL_CAPSULE | Freq: Every day | ORAL | Status: DC
  Administered 2022-07-04 – 2022-07-05 (×2): 220 mg via ORAL
  Filled 2022-07-04 (×2): qty 1

## 2022-07-04 MED ORDER — ONDANSETRON HCL 4 MG/2ML IJ SOLN
4.0000 mg | Freq: Four times a day (QID) | INTRAMUSCULAR | Status: DC | PRN
  Administered 2022-07-04: 4 mg via INTRAVENOUS
  Filled 2022-07-04: qty 2

## 2022-07-04 MED ORDER — ALBUTEROL SULFATE HFA 108 (90 BASE) MCG/ACT IN AERS: 2.0000 | INHALATION_SPRAY | Freq: Four times a day (QID) | RESPIRATORY_TRACT | Status: DC

## 2022-07-04 MED ORDER — INSULIN ASPART 100 UNIT/ML IJ SOLN
0.0000 [IU] | Freq: Every day | INTRAMUSCULAR | Status: DC
  Administered 2022-07-04: 2 [IU] via SUBCUTANEOUS
  Filled 2022-07-04: qty 1

## 2022-07-04 NOTE — Assessment & Plan Note (Deleted)
-   Etiology work-up in progress, presumptive diagnosis secondary to COVID-19 infection

## 2022-07-04 NOTE — Assessment & Plan Note (Signed)
-   Per son over the phone at baseline patient would know the current calendar year and if he did not know he it would be off by 1 to 2 years - Given that patient did not know the current calendar year and could not even begin to guess, son states this is abnormal

## 2022-07-04 NOTE — Hospital Course (Signed)
Mr. 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  Initial vitals in the emergency department showed temperature of 99.6, respiration rate of 28, heart rate of 96, blood pressure 136/91, SPO2 of 89% on room air.  Patient was placed on 3 L nasal cannula with improvement to 95%.  Initial vitals in the emergency department showed temperature of 138, potassium 4.2, chloride 103, bicarb 22, BUN of 19, serum creatinine 1.18, nonfasting blood glucose 113, WBC 13.3, hemoglobin 18.4, platelets of 153.  High sensitive troponin was 21.  Lactic acid was 1.5.  Patient tested positive for COVID-19 by PCR.  ED treatment: 1 L sodium chloride bolus.

## 2022-07-04 NOTE — Assessment & Plan Note (Signed)
-   Atorvastatin 10 mg daily resumed 

## 2022-07-04 NOTE — ED Notes (Signed)
Verbal report given to [employer], pt placed on O2 3 L [place], pt's sat 89-90% on room air, sats increased to 92% on 3 L [place], pt taken to room 24.

## 2022-07-04 NOTE — Assessment & Plan Note (Addendum)
-   Etiology work-up in progress, differentials include COVID-19 infection versus early signs of CVA - Please see treatment for COVID-19 as above - MRI of the brain without contrast ordered, patient declined - Admit to telemetry medical, observation

## 2022-07-04 NOTE — Assessment & Plan Note (Signed)
-   Patient is not a candidate for Paxlovid due to on flecainide and statin medication - Symptomatic support - Continue oxygen supplementation to maintain SPO2 greater than 92% - Albuterol 2 puff inhalation every 6 hours as needed for shortness of breath and wheezing - Guaifenesin-dextromethorphan, 10 ml, p.o. every 4 hours as needed for cough - Ascorbic acid and zinc sulfate - IV steroids tapering to oral prednisone initiated - Admit to telemetry medical, observation

## 2022-07-04 NOTE — H&P (Addendum)
History and Physical   Peter Castro ZOX:[id number] DOB: September 04, 1946 DOA: 07/04/2022  PCP: McLean-Scocuzza, Pasty Spillers, MD  Outpatient Specialists: Dr. Mariah Milling, [facility] cardiology Patient coming from: Home via POV  I have personally briefly reviewed patient's old medical records in [facility] EMR.  Chief Concern: "feeling bad"  HPI: Mr. 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  Initial vitals in the emergency department showed temperature of 99.6, respiration rate of 28, heart rate of 96, blood pressure 136/91, SPO2 of 89% on room air.  Patient was placed on 3 L nasal cannula with improvement to 95%.  Initial vitals in the emergency department showed temperature of 138, potassium 4.2, chloride 103, bicarb 22, BUN of 19, serum creatinine 1.18, nonfasting blood glucose 113, WBC 13.3, hemoglobin 18.4, platelets of 153.  High sensitive troponin was 21.  Lactic acid was 1.5.  Patient tested positive for COVID-19 by PCR.  ED treatment: 1 L sodium chloride bolus.  At bedside patient was able to tell me his full name and he knows he is in the hospital.  He was not able to tell me his age or the current calendar year.  He reports that he was feeling bad for the last 2 days.  He drove himself to the hospital.  He denies shortness of breath, chest pain, abdominal pain, dysuria, hematuria, diarrhea.  He denies known sick contacts.  He endorses generalized weakness and cough.  He reports the cough is nonproductive.  Social history: He lives by himself. He denies tobacco, EtOH, recreational drug use.  He is retired and formerly worked as a Nurse, adult.  Vaccination history: 4 vaccinations  ROS: Constitutional: no weight change, no fever ENT/Mouth: no sore throat, no rhinorrhea Eyes: no eye pain, no vision  changes Cardiovascular: no chest pain, no dyspnea,  no edema, no palpitations Respiratory: + cough, no sputum, no wheezing Gastrointestinal: no nausea, no vomiting, no diarrhea, no constipation Genitourinary: no urinary incontinence, no dysuria, no hematuria Musculoskeletal: no arthralgias, no myalgias Skin: no skin lesions, no pruritus, Neuro: + weakness, no loss of consciousness, no syncope Psych: no anxiety, no depression, no decrease appetite Heme/Lymph: no bruising, no bleeding  ED Course: Discussed with emergency medicine provider, patient requiring hospitalization for chief concerns of altered mental status and COVID-19 infection  Assessment/Plan  Principal Problem:   Altered mental status Active Problems:   Atrial fibrillation (HCC)   Essential hypertension   Hyperlipidemia   Memory loss   GERD (gastroesophageal reflux disease)   COVID-19 virus infection   Paroxysmal atrial fibrillation with RVR (HCC)   Assessment and Plan:  * Altered mental status - Etiology work-up in progress, differentials include COVID-19 infection versus early signs of CVA - Please see treatment for COVID-19 as above - MRI of the brain without contrast ordered, patient declined - Admit to telemetry medical, observation  Paroxysmal atrial fibrillation with RVR (HCC) - Etiology work-up in progress, presumptive diagnosis secondary to COVID-19 infection  COVID-19 virus infection - Patient is not a candidate for Paxlovid due to on flecainide and statin medication - Symptomatic support - Continue oxygen supplementation to maintain SPO2 greater than 92% - Albuterol 2 puff inhalation every 6 hours as needed for shortness of breath and wheezing - Guaifenesin-dextromethorphan, 10 ml, p.o. every 4 hours as needed for cough - Ascorbic acid and zinc sulfate - IV steroids tapering to oral prednisone initiated -  Admit to telemetry medical, observation  Memory loss - Per son over the phone at baseline  patient would know the current calendar year and if he did not know he it would be off by 1 to 2 years - Given that patient did not know the current calendar year and could not even begin to guess, son states this is abnormal  Hyperlipidemia - Atorvastatin 10 mg daily resumed  Essential hypertension - Home medications include lisinopril 10 mg daily, carvedilol 25 mg p.o. twice daily were resumed - Hydralazine 10 mg p.o. every 6 hours as needed for SBP greater than 180, 4 days ordered  Atrial fibrillation (HCC) - Carvedilol 25 mg p.o. twice daily and flecainide 100 mg twice daily were resumed - Eliquis 5 mg p.o. twice daily resumed  Chart reviewed.   DVT prophylaxis: Eliquis 5 mg p.o. twice daily Code Status: DNR/DNI Diet: Heart healthy Family Communication: Discussed and updated family over the phone, at [phone] Disposition Plan: Anticipate discharge on 07/05/2022 Consults called: None at this time Admission status: Telemetry medical, observation  Past Medical History:  Diagnosis Date   A-fib (HCC)    a. 10/2017 s/p cryo balloon pulm vein isolation.   Arthritis    Atrial flutter ([facility])    a. 01/2014 s/p RFCA.   COVID-19    12/2020   GERD (gastroesophageal reflux disease)    History of chicken pox    Hyperlipidemia    Hypertension    Loss of memory    Stroke ([facility])    ~2013/14   Subarachnoid hemorrhage ([facility])    a. 12/2020 Trace R occipital SAH in setting of dizziness/fall.   Subdural hematoma ([facility])    a. 12/2020 CT head: 44mm R parafalcine/tentorial SDH, trace R occipital SAH following fall in the setting of dizziness.   Past Surgical History:  Procedure Laterality Date   APPENDECTOMY     OTHER SURGICAL HISTORY     cryoballon pvi isthmus tricuspid ablation Dr. Kathrynn Ducking 01/2014 [place]    Social History:  reports that he has quit smoking. He has never used smokeless tobacco. He reports current alcohol use. He reports that he does not currently use drugs.  No Known  Allergies Family History  Problem Relation Age of Onset   Dementia Mother    Cancer Father        lung smoker    Family history: Family history reviewed and pertinent dementia in mother.  Prior to Admission medications   Medication Sig Start Date End Date Taking? Authorizing Provider  amLODipine (NORVASC) 5 MG tablet Take 1 tablet (5 mg total) by mouth daily as needed. 04/15/22   McLean-Scocuzza, Pasty Spillers, MD  apixaban (ELIQUIS) 5 MG TABS tablet Take 1 tablet (5 mg total) by mouth 2 (two) times daily. 04/15/22   McLean-Scocuzza, Pasty Spillers, MD  apixaban (ELIQUIS) 5 MG TABS tablet Take 1 tablet (5 mg total) by mouth 2 (two) times daily. Samples of this drug were given to the patient, quantity 4 boxes #56 tablets Lot Number [id number] exp march 2025 04/15/22   McLean-Scocuzza, Pasty Spillers, MD  atorvastatin (LIPITOR) 10 MG tablet Take 1 tablet (10 mg total) by mouth daily at 6 PM. 04/15/22   McLean-Scocuzza, Pasty Spillers, MD  carvedilol (COREG) 25 MG tablet Take 1 tablet (25 mg total) by mouth 2 (two) times daily with a meal. 04/15/22   McLean-Scocuzza, Pasty Spillers, MD  flecainide (TAMBOCOR) 100 MG tablet TAKE ONE TABLET BY MOUTH TWICE A DAY 05/05/22   Gollan,  Kathlene November, MD  lisinopril (ZESTRIL) 10 MG tablet Take 1 tablet (10 mg total) by mouth daily. 04/15/22   McLean-Scocuzza, Nino Glow, MD   Physical Exam: Vitals:   07/04/22 0930 07/04/22 1000 07/04/22 1233 07/04/22 1336  BP: (!) 147/93 (!) 148/103 (!) 155/95 (!) 142/97  Pulse: 90 95 98 86  Resp: (!) 28 (!) 32 19 19  Temp:   98.2 F (36.8 C) 98.3 F (36.8 C)  TempSrc:   Oral   SpO2: 99% 96% 97% 97%  Weight:      Height:       Constitutional: appears age-appropriate, NAD, calm, comfortable Eyes: PERRL, lids and conjunctivae normal ENMT: Mucous membranes are moist. Posterior pharynx clear of any exudate or lesions. Age-appropriate dentition. Hearing appropriate Neck: normal, supple, no masses, no thyromegaly Respiratory: clear to auscultation bilaterally, no  wheezing, no crackles. Normal respiratory effort. No accessory muscle use.  Cardiovascular: Regular rate and rhythm, no murmurs / rubs / gallops. No extremity edema. 2+ pedal pulses. No carotid bruits.  Abdomen: no tenderness, no masses palpated, no hepatosplenomegaly. Bowel sounds positive.  Musculoskeletal: no clubbing / cyanosis. No joint deformity upper and lower extremities. Good ROM, no contractures, no atrophy. Normal muscle tone.  Skin: no rashes, lesions, ulcers. No induration Neurologic: Sensation intact. Strength 5/5 in all 4.  Psychiatric: Questionable judgment and insight. Alert and oriented x self and location. Normal mood.   EKG: independently reviewed, showing atrial fibrillation with RVR, rate of 119, QTc 469  Chest x-ray on Admission: I personally reviewed and I agree with radiologist reading as below.  CT Head Wo Contrast  Result Date: 07/04/2022 CLINICAL DATA:  Mental status change. EXAM: CT HEAD WITHOUT CONTRAST TECHNIQUE: Contiguous axial images were obtained from the base of the skull through the vertex without intravenous contrast. RADIATION DOSE REDUCTION: This exam was performed according to the departmental dose-optimization program which includes automated exposure control, adjustment of the mA and/or kV according to patient size and/or use of iterative reconstruction technique. COMPARISON:  December 11, 2020 CT of the brain FINDINGS: Brain: No subdural, epidural, or subarachnoid hemorrhage. Ventricles and sulci are unremarkable. A left frontal infarct remain with encephalomalacia. No acute cortical ischemia or infarct identified. Scattered white matter changes are identified. No mass effect or midline shift. Cerebellum, brainstem, and basal cisterns are normal. Vascular: No hyperdense vessel or unexpected calcification. Skull: Normal. Negative for fracture or focal lesion. Sinuses/Orbits: No acute finding. Other: None. IMPRESSION: No acute intracranial abnormalities. Left  frontal infarct and white matter changes are stable and nonacute. Electronically Signed   By: Dorise Bullion III M.D.   On: 07/04/2022 10:09   DG Chest 2 View  Result Date: 07/04/2022 CLINICAL DATA:  Patient started feeling bad yesterday. EXAM: CHEST - 2 VIEW COMPARISON:  None Available. FINDINGS: The heart size and mediastinal contours are within normal limits. Both lungs are clear. The visualized skeletal structures are unremarkable. IMPRESSION: No active cardiopulmonary disease. Electronically Signed   By: Dorise Bullion III M.D.   On: 07/04/2022 09:09    Labs on Admission: I have personally reviewed following labs  CBC: Recent Labs  Lab 07/04/22 0828  WBC 13.3*  HGB 18.4*  HCT 54.9*  MCV 89.1  PLT 0000000   Basic Metabolic Panel: Recent Labs  Lab 07/04/22 0828 07/04/22 1149  NA 138  --   K 4.2  --   CL 103  --   CO2 22  --   GLUCOSE 113*  --   BUN  19  --   CREATININE 1.18  --   CALCIUM 9.1  --   PHOS  --  3.2   GFR: Estimated Creatinine Clearance: 51.8 mL/min (by C-G formula based on SCr of 1.18 mg/dL).  Liver Function Tests: Recent Labs  Lab 07/04/22 0858  AST 23  ALT 16  ALKPHOS 71  BILITOT 2.1*  PROT 7.0  ALBUMIN 4.2   Coagulation Profile: Recent Labs  Lab 07/04/22 0828  INR 1.2   Urine analysis:    Component Value Date/Time   COLORURINE YELLOW (A) 07/04/2022 0858   APPEARANCEUR HAZY (A) 07/04/2022 0858   APPEARANCEUR Cloudy (A) 10/29/2021 0819   LABSPEC 1.020 07/04/2022 0858   PHURINE 6.0 07/04/2022 0858   GLUCOSEU NEGATIVE 07/04/2022 0858   GLUCOSEU NEGATIVE 10/15/2021 0928   HGBUR NEGATIVE 07/04/2022 0858   BILIRUBINUR NEGATIVE 07/04/2022 0858   BILIRUBINUR Negative 10/29/2021 0819   KETONESUR 5 (A) 07/04/2022 0858   PROTEINUR NEGATIVE 07/04/2022 0858   UROBILINOGEN 0.2 10/15/2021 0928   NITRITE NEGATIVE 07/04/2022 0858   LEUKOCYTESUR NEGATIVE 07/04/2022 0858   Dr. Tobie Poet [facility]  If 7PM-7AM, please contact overnight-coverage  provider If 7AM-7PM, please contact day coverage provider [email or web address]  07/04/2022, 3:28 PM

## 2022-07-04 NOTE — ED Notes (Signed)
Patient incontinent of urine upon arrival to room

## 2022-07-04 NOTE — ED Notes (Addendum)
Patients pants, belt and underwear placed in bag at bedside.   Tennis shoes and keys at bedside  Patient has phone in hands

## 2022-07-04 NOTE — ED Triage Notes (Signed)
Pt alert, states he started feeling bad yesterday, pt states he feels "out of it". Pt states he takes eliquis and has been taking it. Pt with hx of afib. Pt states he has had some dizziness but no falls. Pt denies N/V/D

## 2022-07-04 NOTE — Assessment & Plan Note (Signed)
-   Etiology work-up in progress, presumptive diagnosis secondary to COVID-19 infection 

## 2022-07-04 NOTE — Assessment & Plan Note (Addendum)
-   Home medications include lisinopril 10 mg daily, carvedilol 25 mg p.o. twice daily were resumed - Hydralazine 10 mg p.o. every 6 hours as needed for SBP greater than 180, 4 days ordered

## 2022-07-04 NOTE — Assessment & Plan Note (Addendum)
-   Carvedilol 25 mg p.o. twice daily and flecainide 100 mg twice daily were resumed - Eliquis 5 mg p.o. twice daily resumed

## 2022-07-04 NOTE — Progress Notes (Signed)
Patient arrived on floor via hospital bed. Patient oriented to room and unit.

## 2022-07-04 NOTE — ED Provider Notes (Signed)
[facility] Provider Note    Event Date/Time   First MD Initiated Contact with Patient 07/04/22 0830     (approximate)   History   Dizziness   HPI  Peter Castro is a 76 y.o. male with a history of atrial fibrillation, hypertension, hyperlipidemia, prior stroke, subarachnoid hemorrhage, and GERD who presents with generalized weakness and altered mental status.  The patient drove himself to the ED and told triage that he has felt "out of it" since yesterday and has been dizzy.  However for me the patient is unable to explain why he is here; he will start telling me "it's just that I..." or "it started after..." but then is unable to explain further.  He knows he is in the hospital but does not know the year.  He denies any pain.  When I ask him if he feels confused or out of it he acknowledges this.    Physical Exam   Triage Vital Signs: ED Triage Vitals  Enc Vitals Group     BP 07/04/22 0823 (!) 138/111     Pulse Rate 07/04/22 0815 (!) 124     Resp 07/04/22 0815 18     Temp 07/04/22 0815 99.6 F (37.6 C)     Temp Source 07/04/22 0815 Oral     SpO2 07/04/22 0815 91 %     Weight 07/04/22 0818 170 lb (77.1 kg)     Height 07/04/22 0818 5\' 5"  (1.651 m)     Head Circumference --      Peak Flow --      Pain Score 07/04/22 0818 0     Pain Loc --      Pain Edu? --      Excl. in GC? --     Most recent vital signs: Vitals:   07/04/22 1233 07/04/22 1336  BP: (!) 155/95 (!) 142/97  Pulse: 98 86  Resp: 19 19  Temp: 98.2 F (36.8 C) 98.3 F (36.8 C)  SpO2: 97% 97%     General: Alert, oriented x2, confused appearing. CV:  Good peripheral perfusion.  Resp:  Increased respiratory effort.  Lungs CTA B. Abd:  Soft and nontender.  No distention.  Other:  EOMI.  PERRLA.  Motor intact in all extremities.  No facial droop.   ED Results / Procedures / Treatments   Labs (all labs ordered are listed, but only abnormal results are displayed) Labs  Reviewed  RESP PANEL BY RT-PCR (FLU A&B, COVID) ARPGX2 - Abnormal; Notable for the following components:      Result Value   SARS Coronavirus 2 by RT PCR POSITIVE (*)    All other components within normal limits  BASIC METABOLIC PANEL - Abnormal; Notable for the following components:   Glucose, Bld 113 (*)    All other components within normal limits  CBC - Abnormal; Notable for the following components:   WBC 13.3 (*)    RBC 6.16 (*)    Hemoglobin 18.4 (*)    HCT 54.9 (*)    All other components within normal limits  HEPATIC FUNCTION PANEL - Abnormal; Notable for the following components:   Total Bilirubin 2.1 (*)    Bilirubin, Direct 0.4 (*)    Indirect Bilirubin 1.7 (*)    All other components within normal limits  URINALYSIS, ROUTINE W REFLEX MICROSCOPIC - Abnormal; Notable for the following components:   Color, Urine YELLOW (*)    APPearance HAZY (*)    Ketones, ur  5 (*)    All other components within normal limits  BLOOD GAS, VENOUS - Abnormal; Notable for the following components:   pH, Ven 7.45 (*)    pCO2, Ven 37 (*)    pO2, Ven 64 (*)    All other components within normal limits  GLUCOSE, CAPILLARY - Abnormal; Notable for the following components:   Glucose-Capillary 105 (*)    All other components within normal limits  TROPONIN I (HIGH SENSITIVITY) - Abnormal; Notable for the following components:   Troponin I (High Sensitivity) 21 (*)    All other components within normal limits  TROPONIN I (HIGH SENSITIVITY) - Abnormal; Notable for the following components:   Troponin I (High Sensitivity) 24 (*)    All other components within normal limits  PROTIME-INR  LACTIC ACID, PLASMA  URINE DRUG SCREEN, QUALITATIVE ([facility] ONLY)  PHOSPHORUS  TSH  PROCALCITONIN     EKG  ED ECG REPORT I, Dionne Bucy, the attending physician, personally viewed and interpreted this ECG.  Date: 07/04/2022 EKG Time: 0809 Rate: 119 Rhythm: Atrial fibrillation with RVR QRS Axis:  normal Intervals: normal ST/T Wave abnormalities: Nonspecific ST abnormalities Narrative Interpretation: Atrial fibrillation with no evidence of acute ischemia    RADIOLOGY  Chest x-ray: I independently viewed and interpreted the images; there is no focal consolidation or edema  CT head: No ICH or other acute abnormality  PROCEDURES:  Critical Care performed: No  Procedures   MEDICATIONS ORDERED IN ED: Medications  atorvastatin (LIPITOR) tablet 10 mg (has no administration in time range)  acetaminophen (TYLENOL) tablet 650 mg (has no administration in time range)    Or  acetaminophen (TYLENOL) suppository 650 mg (has no administration in time range)  ondansetron (ZOFRAN) tablet 4 mg ( Oral See Alternative 07/04/22 1540)    Or  ondansetron (ZOFRAN) injection 4 mg (4 mg Intravenous Given by Other 07/04/22 1540)  senna-docusate (Senokot-S) tablet 1 tablet (has no administration in time range)  apixaban (ELIQUIS) tablet 5 mg (has no administration in time range)  guaiFENesin-dextromethorphan (ROBITUSSIN DM) 100-10 MG/5ML syrup 10 mL (has no administration in time range)  methylPREDNISolone sodium succinate (SOLU-MEDROL) 125 mg/2 mL injection 76.875 mg (76.875 mg Intravenous Given 07/04/22 1417)    Followed by  predniSONE (DELTASONE) tablet 50 mg (has no administration in time range)  insulin aspart (novoLOG) injection 0-20 Units (0 Units Subcutaneous Not Given 07/04/22 1408)  insulin aspart (novoLOG) injection 0-5 Units (has no administration in time range)  ascorbic acid (VITAMIN C) tablet 500 mg (500 mg Oral Given 07/04/22 1416)  zinc sulfate capsule 220 mg (220 mg Oral Given 07/04/22 1416)  albuterol (VENTOLIN HFA) 108 (90 Base) MCG/ACT inhaler 2 puff (has no administration in time range)  hydrALAZINE (APRESOLINE) tablet 10 mg (has no administration in time range)  carvedilol (COREG) tablet 25 mg (has no administration in time range)  flecainide (TAMBOCOR) tablet 100 mg (has no  administration in time range)  lisinopril (ZESTRIL) tablet 10 mg (has no administration in time range)  sodium chloride 0.9 % bolus 1,000 mL (0 mLs Intravenous Stopped 07/04/22 1038)     IMPRESSION / MDM / ASSESSMENT AND PLAN / ED COURSE  I reviewed the triage vital signs and the nursing notes.  76 year old male with PMH as noted above presents in his own vehicle stating he feels "out of it" and on my exam appears short of breath and altered.  He denies a fall.  He has a borderline elevated temperature, tachycardia, and was hypoxic  to the high 80s on room air.  Neurologic exam is nonfocal.  I reviewed the past medical records.  The patient was most recently seen by his primary care for routine follow-up on 6/7 and is taking Coreg and Eliquis for his atrial fibrillation.  Per the hospitalist discharge summary from 12/15/2020 he was hospitalized at that time for subdural hemorrhage and subarachnoid hemorrhage not requiring surgery.  Differential diagnosis includes, but is not limited to, recurrent intracranial hemorrhage, respiratory failure with hypercapnia due to pneumonia or other acute pulmonary process, metabolic disturbance such as electrolyte abnormality, AKI, or hyperglycemia, other acute infection/sepsis, other primary CNS cause or cardiac cause.  Patient's presentation is most consistent with acute presentation with potential threat to life or bodily function.  We will obtain CT head, chest x-ray, lab work-up, give fluids and reassess.  The patient is on the cardiac monitor to evaluate for evidence of arrhythmia and/or significant heart rate changes.  ----------------------------------------- 11:34 AM on 07/04/2022 -----------------------------------------  COVID is positive.  The other labs are unremarkable.  PCO2 is not elevated.  Electrolytes are normal.  The patient has no leukocytosis and a normal lactate.  CT head shows no acute findings.  Overall presentation is consistent with  encephalopathy related to COVID-19 infection although the patient's mental status is improved.  I did Dr. Sedalia Muta from the hospitalist service; based on her discussion she agrees to admit the patient.  FINAL CLINICAL IMPRESSION(S) / ED DIAGNOSES   Final diagnoses:  Encephalopathy due to COVID-19 virus     Rx / DC Orders   ED Discharge Orders     None        Note:  This document was prepared using Dragon voice recognition software and may include unintentional dictation errors.    Dionne Bucy, MD 07/04/22 1600

## 2022-07-05 DIAGNOSIS — I482 Chronic atrial fibrillation, unspecified: Secondary | ICD-10-CM | POA: Diagnosis not present

## 2022-07-05 DIAGNOSIS — U071 COVID-19: Secondary | ICD-10-CM | POA: Diagnosis not present

## 2022-07-05 DIAGNOSIS — R4182 Altered mental status, unspecified: Secondary | ICD-10-CM | POA: Diagnosis not present

## 2022-07-05 DIAGNOSIS — R29818 Other symptoms and signs involving the nervous system: Secondary | ICD-10-CM | POA: Diagnosis not present

## 2022-07-05 LAB — BASIC METABOLIC PANEL
Anion gap: 4 — ABNORMAL LOW (ref 5–15)
BUN: 23 mg/dL (ref 8–23)
CO2: 29 mmol/L (ref 22–32)
Calcium: 8.7 mg/dL — ABNORMAL LOW (ref 8.9–10.3)
Chloride: 105 mmol/L (ref 98–111)
Creatinine, Ser: 1.11 mg/dL (ref 0.61–1.24)
GFR, Estimated: >60 mL/min (ref >60)
Glucose, Bld: 137 mg/dL — ABNORMAL HIGH (ref 70–99)
Potassium: 4.9 mmol/L (ref 3.5–5.1)
Sodium: 138 mmol/L (ref 135–145)

## 2022-07-05 LAB — GLUCOSE, CAPILLARY
Glucose-Capillary: 142 mg/dL — ABNORMAL HIGH (ref 70–99)
Glucose-Capillary: 162 mg/dL — ABNORMAL HIGH (ref 70–99)

## 2022-07-05 LAB — CBC
HCT: 51.1 % (ref 39.0–52.0)
Hemoglobin: 17.1 g/dL — ABNORMAL HIGH (ref 13.0–17.0)
MCH: 30.4 pg (ref 26.0–34.0)
MCHC: 33.5 g/dL (ref 30.0–36.0)
MCV: 90.8 fL (ref 80.0–100.0)
Platelets: 119 10*3/uL — ABNORMAL LOW (ref 150–400)
RBC: 5.63 MIL/uL (ref 4.22–5.81)
RDW: 14.1 % (ref 11.5–15.5)
WBC: 9.1 10*3/uL (ref 4.0–10.5)
nRBC: 0 % (ref 0.0–0.2)

## 2022-07-05 MED ORDER — STERILE WATER FOR INJECTION IJ SOLN
INTRAMUSCULAR | Status: AC
  Filled 2022-07-05: qty 10

## 2022-07-05 NOTE — Progress Notes (Signed)
Patient being discharged home. IV removed. Went over discharge instructions with patient. All questions were answered and he stated that he understood. Took patient via wheelchair to the ER where his car was parked. Patient stated he was okay and felt fine to drive home. Made call to son and left message on voicemail to make him aware.

## 2022-07-05 NOTE — Discharge Summary (Addendum)
Physician Discharge Summary   Patient: Peter Castro MRN: [id number] DOB: 1945/11/23  Admit date:     07/04/2022  Discharge date: 07/05/22  Discharge Physician: Marrion Coy   PCP: McLean-Scocuzza, Pasty Spillers, MD   Recommendations at discharge:   Follow-up with PCP in 1 week.  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  Initial vitals in the emergency department showed temperature of 99.6, respiration rate of 28, heart rate of 96, blood pressure 136/91, SPO2 of 89% on room air.  Patient was placed on 3 L nasal cannula with improvement to 95%.  Initial vitals in the emergency department showed temperature of 138, potassium 4.2, chloride 103, bicarb 22, BUN of 19, serum creatinine 1.18, nonfasting blood glucose 113, WBC 13.3, hemoglobin 18.4, platelets of 153.  High sensitive troponin was 21.  Lactic acid was 1.5.  Patient tested positive for COVID-19 by PCR.  ED treatment: 1 L sodium chloride bolus.  Assessment and Plan: COVID-19 virus infection. Altered mental status secondary to COVID infection. Patient doing well, he does not have any short of breath, no hypoxia, no need for steroids. Remdesivir was contraindicated. Patient was given IV fluids, mental status has improved.  Currently he is alert and oriented to time place and person.  At this point, he is medically stable to be discharged. He did obtain the MRI, no acute changes, 1).  unchanged right parietal lobe cavernous malformation. 2). Unchanged left frontal lobe encephalomalacia.  Paroxysmal atrial fibrillation. Resume anticoagulation.  Essential hypertension. Resume home medicines.      Consultants: None Procedures performed: None  Disposition: Home Diet recommendation:  Discharge Diet Orders (From admission, onward)     Start     Ordered   07/05/22 0000  Diet - low sodium heart healthy        07/05/22 1306           Cardiac diet DISCHARGE MEDICATION: Allergies as of 07/05/2022   No Known Allergies      Medication List     TAKE these medications    amLODipine 5 MG tablet Commonly known as: NORVASC Take 1 tablet (5 mg total) by mouth daily as needed.   apixaban 5 MG Tabs tablet Commonly known as: ELIQUIS Take 1 tablet (5 mg total) by mouth 2 (two) times daily.   apixaban 5 MG Tabs tablet Commonly known as: ELIQUIS Take 1 tablet (5 mg total) by mouth 2 (two) times daily. Samples of this drug were given to the patient, quantity 4 boxes #56 tablets Lot Number [id number] exp march 2025   atorvastatin 10 MG tablet Commonly known as: LIPITOR Take 1 tablet (10 mg total) by mouth daily at 6 PM.   carvedilol 25 MG tablet Commonly known as: COREG Take 1 tablet (25 mg total) by mouth 2 (two) times daily with a meal.   flecainide 100 MG tablet Commonly known as: TAMBOCOR TAKE ONE TABLET BY MOUTH TWICE A DAY   lisinopril 10 MG tablet Commonly known as: ZESTRIL Take 1 tablet (10 mg  total) by mouth daily.        Follow-up Information     McLean-Scocuzza, Pasty Spillers, MD Follow up in 1 week(s).   Specialty: Internal Medicine Contact information: [address] [place] [address] [phone]         Antonieta Iba, MD .   Specialty: Cardiology Contact information: [address] [place] [address] [phone]                Discharge Exam: Ceasar Mons Weights   07/04/22 0818  Weight: 77.1 kg    General exam: Appears calm and comfortable  Respiratory system: Clear to auscultation. Respiratory effort normal. Cardiovascular system: irregular. No JVD, murmurs, rubs, gallops or clicks. No pedal edema. Gastrointestinal system: Abdomen is nondistended, soft and nontender. No organomegaly or masses felt. Normal bowel sounds heard. Central nervous system: Alert and oriented. No focal neurological deficits. Extremities: Symmetric 5 x 5 power. Skin: No rashes, lesions or ulcers Psychiatry: Judgement and insight appear normal. Mood & affect appropriate.    Condition at discharge: good  The results of significant diagnostics from this hospitalization (including imaging, microbiology, ancillary and laboratory) are listed below for reference.   Imaging Studies: MR BRAIN WO CONTRAST  Result Date: 07/05/2022 CLINICAL DATA:  Acute neurologic deficit EXAM: MRI HEAD WITHOUT CONTRAST TECHNIQUE: Multiplanar, multiecho pulse sequences of the brain and surrounding structures were obtained without intravenous contrast. COMPARISON:  None Available. FINDINGS: Brain: No acute infarct, mass effect or extra-axial collection. Cavernous malformation of the right parietal lobe is unchanged. Unchanged encephalomalacia in the left frontal lobe. There is multifocal periventricular white matter hyperintensity, most often a result of chronic microvascular ischemia. The midline structures are normal. Vascular: Major flow voids are preserved. Skull and upper cervical spine: Normal calvarium and skull base. Visualized upper cervical spine and soft tissues are normal. Sinuses/Orbits:No paranasal sinus fluid levels or advanced mucosal thickening. No mastoid or middle ear effusion. Normal orbits. IMPRESSION: 1. No acute intracranial abnormality. 2. Unchanged right parietal lobe cavernous malformation. 3. Unchanged left frontal lobe encephalomalacia. Electronically Signed   By: Deatra Robinson M.D.   On: 07/05/2022 00:51   CT  Head Wo Contrast  Result Date: 07/04/2022 CLINICAL DATA:  Mental status change. EXAM: CT HEAD WITHOUT CONTRAST TECHNIQUE: Contiguous axial images were obtained from the base of the skull through the vertex without intravenous contrast. RADIATION DOSE REDUCTION: This exam was performed according to the departmental dose-optimization program which includes automated exposure control, adjustment of the mA and/or kV according to patient size and/or use of iterative reconstruction technique. COMPARISON:  December 11, 2020 CT of the brain FINDINGS: Brain: No subdural, epidural, or subarachnoid hemorrhage. Ventricles and sulci are unremarkable. A left frontal infarct remain with encephalomalacia. No acute cortical ischemia or infarct identified. Scattered white matter changes are identified. No mass effect or midline shift. Cerebellum, brainstem, and basal cisterns are normal. Vascular: No hyperdense vessel or unexpected calcification. Skull: Normal. Negative for fracture or focal lesion. Sinuses/Orbits: No acute finding. Other: None. IMPRESSION: No acute intracranial abnormalities. Left frontal infarct and white matter changes are stable and nonacute. Electronically Signed   By: Gerome Sam III M.D.   On: 07/04/2022 10:09   DG Chest 2 View  Result Date: 07/04/2022 CLINICAL DATA:  Patient started feeling bad yesterday. EXAM: CHEST - 2 VIEW COMPARISON:  None Available. FINDINGS: The heart size and mediastinal contours are within normal limits. Both lungs are clear. The visualized skeletal structures are unremarkable. IMPRESSION: No active cardiopulmonary  disease. Electronically Signed   By: Gerome Sam III M.D.   On: 07/04/2022 09:09    Microbiology: Results for orders placed or performed during the hospital encounter of 07/04/22  Resp Panel by RT-PCR (Flu A&B, Covid) Anterior Nasal Swab     Status: Abnormal   Collection Time: 07/04/22  8:58 AM   Specimen: Anterior Nasal Swab  Result Value Ref Range  Status   SARS Coronavirus 2 by RT PCR POSITIVE (A) NEGATIVE Final    Comment: (NOTE) SARS-CoV-2 target nucleic acids are DETECTED.  The SARS-CoV-2 RNA is generally detectable in upper respiratory specimens during the acute phase of infection. Positive results are indicative of the presence of the identified virus, but do not rule out bacterial infection or co-infection with other pathogens not detected by the test. Clinical correlation with patient history and other diagnostic information is necessary to determine patient infection status. The expected result is Negative.  Fact Sheet for Patients: BloggerCourse.com  Fact Sheet for Healthcare Providers: SeriousBroker.it  This test is not yet approved or cleared by the [place] FDA and  has been authorized for detection and/or diagnosis of SARS-CoV-2 by FDA under an Emergency Use Authorization (EUA).  This EUA will remain in effect (meaning this test can be used) for the duration of  the COVID-19 declaration under Section 564(b)(1) of the A ct, 21 U.S.C. section 360bbb-3(b)(1), unless the authorization is terminated or revoked sooner.     Influenza A by PCR NEGATIVE NEGATIVE Final   Influenza B by PCR NEGATIVE NEGATIVE Final    Comment: (NOTE) The Xpert Xpress SARS-CoV-2/FLU/RSV plus assay is intended as an aid in the diagnosis of influenza from Nasopharyngeal swab specimens and should not be used as a sole basis for treatment. Nasal washings and aspirates are unacceptable for Xpert Xpress SARS-CoV-2/FLU/RSV testing.  Fact Sheet for Patients: BloggerCourse.com  Fact Sheet for Healthcare Providers: SeriousBroker.it  This test is not yet approved or cleared by the [place] FDA and has been authorized for detection and/or diagnosis of SARS-CoV-2 by FDA under an Emergency Use Authorization (EUA). This EUA will remain in  effect (meaning this test can be used) for the duration of the COVID-19 declaration under Section 564(b)(1) of the Act, 21 U.S.C. section 360bbb-3(b)(1), unless the authorization is terminated or revoked.  Performed at [facility], [address] [place], [place] [address]     Labs: CBC: Recent Labs  Lab 07/04/22 [phone] 07/05/22 0643  WBC 13.3* 9.1  HGB 18.4* 17.1*  HCT 54.9* 51.1  MCV 89.1 90.8  PLT 153 119*   Basic Metabolic Panel: Recent Labs  Lab 07/04/22 0828 07/04/22 1149 07/05/22 0643  NA 138  --  138  K 4.2  --  4.9  CL 103  --  105  CO2 22  --  29  GLUCOSE 113*  --  137*  BUN 19  --  23  CREATININE 1.18  --  1.11  CALCIUM 9.1  --  8.7*  PHOS  --  3.2  --    Liver Function Tests: Recent Labs  Lab 07/04/22 0858  AST 23  ALT 16  ALKPHOS 71  BILITOT 2.1*  PROT 7.0  ALBUMIN 4.2   CBG: Recent Labs  Lab 07/04/22 1720 07/04/22 1802 07/04/22 2015 07/05/22 0746 07/05/22 1153  GLUCAP 109* 131* 207* 142* 162*    Discharge time spent: greater than 30 minutes.  Signed: Marrion Coy, MD [facility] 07/05/2022

## 2022-07-06 ENCOUNTER — Telehealth: Payer: Self-pay

## 2022-07-06 ENCOUNTER — Encounter: Payer: Self-pay | Admitting: Internal Medicine

## 2022-07-06 NOTE — Telephone Encounter (Signed)
Transition Care Management Unsuccessful Follow-up Telephone Call  Date of discharge and from where:  07/05/22 [facility]  Attempts:  1st Attempt  Reason for unsuccessful TCM follow-up call:  Unable to reach patient. Will follow. HFU scheduled 07/08/22 @ 10:00.

## 2022-07-07 NOTE — Telephone Encounter (Signed)
Transition Care Management Unsuccessful Follow-up Telephone Call  Date of discharge and from where:  07/05/22 [facility]  Attempts:  2nd Attempt  Reason for unsuccessful TCM follow-up call:  Unable to leave message. Unable to reach patient. HFU scheduled 07/08/22 @ 10:00. Keep all scheduled appointments. TCM qualified.

## 2022-07-08 ENCOUNTER — Ambulatory Visit (INDEPENDENT_AMBULATORY_CARE_PROVIDER_SITE_OTHER): Payer: Medicare HMO | Admitting: Internal Medicine

## 2022-07-08 ENCOUNTER — Encounter: Payer: Self-pay | Admitting: Internal Medicine

## 2022-07-08 VITALS — BP 116/64 | HR 87 | Temp 98.3°F | Ht 65.0 in | Wt 170.8 lb

## 2022-07-08 DIAGNOSIS — U071 COVID-19: Secondary | ICD-10-CM | POA: Diagnosis not present

## 2022-07-08 DIAGNOSIS — J40 Bronchitis, not specified as acute or chronic: Secondary | ICD-10-CM

## 2022-07-08 MED ORDER — DM-GUAIFENESIN ER 60-1200 MG PO TB12: 1.0000 | ORAL_TABLET | Freq: Two times a day (BID) | ORAL | 0 refills | Status: DC

## 2022-07-08 MED ORDER — HYDROCOD POLI-CHLORPHE POLI ER 10-8 MG/5ML PO SUER: 5.0000 mL | Freq: Every evening | ORAL | 0 refills | Status: DC | PRN

## 2022-07-08 MED ORDER — MOLNUPIRAVIR EUA 200MG CAPSULE: 4.0000 | ORAL_CAPSULE | Freq: Two times a day (BID) | ORAL | 0 refills | Status: AC

## 2022-07-08 NOTE — Progress Notes (Signed)
Chief Complaint  Patient presents with   Hospitalization Follow-up    Pt tested positive for covid last Saturday pt states he can not sleep he is feeling okay but still has a cough.   ED f/u 07/04/22  Dizziness low O2 85% + covid 19 MRI brain negative feeling ok confusion resolved and O2 nl 99% today still has cough and covid + 07/04/22 cough worse at night and unable to sleep nothing tried and d/c'ed with nothing     Review of Systems  Constitutional:  Negative for weight loss.  HENT:  Negative for hearing loss.   Eyes:  Negative for blurred vision.  Respiratory:  Positive for cough. Negative for shortness of breath.   Cardiovascular:  Negative for chest pain.  Gastrointestinal:  Negative for abdominal pain and blood in stool.  Musculoskeletal:  Negative for back pain.  Skin:  Negative for rash.  Neurological:  Negative for headaches.  Psychiatric/Behavioral:  Negative for depression.    Past Medical History:  Diagnosis Date   A-fib (HCC)    a. 10/2017 s/p cryo balloon pulm vein isolation.   Arthritis    Atrial flutter (HCC)    a. 01/2014 s/p RFCA.   COVID-19    12/2020, 07/04/22   GERD (gastroesophageal reflux disease)    History of chicken pox    Hyperlipidemia    Hypertension    Loss of memory    Stroke (HCC)    ~2013/14   Subarachnoid hemorrhage (HCC)    a. 12/2020 Trace R occipital SAH in setting of dizziness/fall.   Subdural hematoma (HCC)    a. 12/2020 CT head: 58mm R parafalcine/tentorial SDH, trace R occipital SAH following fall in the setting of dizziness.   Past Surgical History:  Procedure Laterality Date   APPENDECTOMY     OTHER SURGICAL HISTORY     cryoballon pvi isthmus tricuspid ablation Dr. Kathrynn Ducking 01/2014 [place]    Family History  Problem Relation Age of Onset   Dementia Mother    Cancer Father        lung smoker    Social History   Socioeconomic History   Marital status: Widowed    Spouse name: Not on file   Number of children: Not on file    Years of education: Not on file   Highest education level: Not on file  Occupational History   Not on file  Tobacco Use   Smoking status: Former   Smokeless tobacco: Never  Vaping Use   Vaping Use: Never used  Substance and Sexual Activity   Alcohol use: Yes    Comment: occassionally-wine   Drug use: Not Currently   Sexual activity: Not Currently  Other Topics Concern   Not on file  Social History Narrative   From [place]    Retired    Some college    Moved from [place] to be with son and 2 grandkids    Widowed since ~2013/14    Owns guns    Wears seat belt    Safe in relationship    Social Determinants of Corporate investment banker Strain: Not on file  Food Insecurity: Not on file  Transportation Needs: Not on file  Physical Activity: Not on file  Stress: Not on file  Social Connections: Not on file  Intimate Partner Violence: Not on file   Current Meds  Medication Sig   amLODipine (NORVASC) 5 MG tablet Take 1 tablet (5 mg total) by mouth daily as needed.  apixaban (ELIQUIS) 5 MG TABS tablet Take 1 tablet (5 mg total) by mouth 2 (two) times daily.   apixaban (ELIQUIS) 5 MG TABS tablet Take 1 tablet (5 mg total) by mouth 2 (two) times daily. Samples of this drug were given to the patient, quantity 4 boxes #56 tablets Lot Number [id number] exp march 2025   atorvastatin (LIPITOR) 10 MG tablet Take 1 tablet (10 mg total) by mouth daily at 6 PM.   carvedilol (COREG) 25 MG tablet Take 1 tablet (25 mg total) by mouth 2 (two) times daily with a meal.   chlorpheniramine-HYDROcodone (TUSSIONEX) 10-8 MG/5ML Take 5 mLs by mouth at bedtime as needed for cough.   Dextromethorphan-Guaifenesin 60-1200 MG 12hr tablet Take 1 tablet by mouth every 12 (twelve) hours. Brand for patient with HTN   lisinopril (ZESTRIL) 10 MG tablet Take 1 tablet (10 mg total) by mouth daily.   molnupiravir EUA (LAGEVRIO) 200 mg CAPS capsule Take 4 capsules (800 mg total) by mouth 2 (two) times daily for 5  days. With food   No Known Allergies Recent Results (from the past 2160 hour(s))  Comprehensive metabolic panel     Status: Abnormal   Collection Time: 04/15/22 11:38 AM  Result Value Ref Range   Sodium 138 135 - 145 mEq/L   Potassium 4.4 3.5 - 5.1 mEq/L   Chloride 102 96 - 112 mEq/L   CO2 31 19 - 32 mEq/L   Glucose, Bld 86 70 - 99 mg/dL   BUN 20 6 - 23 mg/dL   Creatinine, Ser 4.78 0.40 - 1.50 mg/dL   Total Bilirubin 1.0 0.2 - 1.2 mg/dL   Alkaline Phosphatase 72 39 - 117 U/L   AST 20 0 - 37 U/L   ALT 19 0 - 53 U/L   Total Protein 6.7 6.0 - 8.3 g/dL   Albumin 4.1 3.5 - 5.2 g/dL   GFR 29.56 (L) >21.30 mL/min    Comment: Calculated using the CKD-EPI Creatinine Equation (2021)   Calcium 9.2 8.4 - 10.5 mg/dL  Lipid panel     Status: None   Collection Time: 04/15/22 11:38 AM  Result Value Ref Range   Cholesterol 152 0 - 200 mg/dL    Comment: ATP III Classification       Desirable:  < 200 mg/dL               Borderline High:  200 - 239 mg/dL          High:  > = 865 mg/dL   Triglycerides 78.4 0.0 - 149.0 mg/dL    Comment: Normal:  <696 mg/dLBorderline High:  150 - 199 mg/dL   HDL 29.52 >84.13 mg/dL   VLDL 24.4 0.0 - 01.0 mg/dL   LDL Cholesterol 83 0 - 99 mg/dL   Total CHOL/HDL Ratio 3     Comment:                Men          Women1/2 Average Risk     3.4          3.3Average Risk          5.0          4.42X Average Risk          9.6          7.13X Average Risk          15.0          11.0  NonHDL 99.45     Comment: NOTE:  Non-HDL goal should be 30 mg/dL higher than patient's LDL goal (i.e. LDL goal of < 70 mg/dL, would have non-HDL goal of < 100 mg/dL)  CBC with Differential/Platelet     Status: Abnormal   Collection Time: 04/15/22 11:38 AM  Result Value Ref Range   WBC 7.0 4.0 - 10.5 K/uL   RBC 5.62 4.22 - 5.81 Mil/uL   Hemoglobin 17.2 (H) 13.0 - 17.0 g/dL   HCT 86.5 78.4 - 69.6 %   MCV 92.4 78.0 - 100.0 fl   MCHC 33.1 30.0 - 36.0 g/dL   RDW 29.5 28.4 - 13.2  %   Platelets 145.0 (L) 150.0 - 400.0 K/uL   Neutrophils Relative % 60.3 43.0 - 77.0 %   Lymphocytes Relative 28.8 12.0 - 46.0 %   Monocytes Relative 9.3 3.0 - 12.0 %   Eosinophils Relative 0.9 0.0 - 5.0 %   Basophils Relative 0.7 0.0 - 3.0 %   Neutro Abs 4.2 1.4 - 7.7 K/uL   Lymphs Abs 2.0 0.7 - 4.0 K/uL   Monocytes Absolute 0.6 0.1 - 1.0 K/uL   Eosinophils Absolute 0.1 0.0 - 0.7 K/uL   Basophils Absolute 0.1 0.0 - 0.1 K/uL  Basic metabolic panel     Status: Abnormal   Collection Time: 07/04/22  8:28 AM  Result Value Ref Range   Sodium 138 135 - 145 mmol/L   Potassium 4.2 3.5 - 5.1 mmol/L    Comment: HEMOLYSIS AT THIS LEVEL MAY AFFECT RESULT   Chloride 103 98 - 111 mmol/L   CO2 22 22 - 32 mmol/L   Glucose, Bld 113 (H) 70 - 99 mg/dL    Comment: Glucose reference range applies only to samples taken after fasting for at least 8 hours.   BUN 19 8 - 23 mg/dL   Creatinine, Ser 4.40 0.61 - 1.24 mg/dL   Calcium 9.1 8.9 - 10.2 mg/dL   GFR, Estimated >72 >53 mL/min    Comment: (NOTE) Calculated using the CKD-EPI Creatinine Equation (2021)    Anion gap 13 5 - 15    Comment: Performed at [facility], [address] [place], [place] [address]  CBC     Status: Abnormal   Collection Time: 07/04/22  8:28 AM  Result Value Ref Range   WBC 13.3 (H) 4.0 - 10.5 K/uL   RBC 6.16 (H) 4.22 - 5.81 MIL/uL   Hemoglobin 18.4 (H) 13.0 - 17.0 g/dL   HCT 34.7 (H) 42.5 - 95.6 %   MCV 89.1 80.0 - 100.0 fL   MCH 29.9 26.0 - 34.0 pg   MCHC 33.5 30.0 - 36.0 g/dL   RDW 38.7 56.4 - 33.2 %   Platelets 153 150 - 400 K/uL   nRBC 0.0 0.0 - 0.2 %    Comment: Performed at [facility], [address] [place], [place] [address]  Protime-INR- (order if Patient is taking Coumadin / Warfarin)     Status: None   Collection Time: 07/04/22  8:28 AM  Result Value Ref Range   Prothrombin Time 15.1 11.4 - 15.2 seconds   INR 1.2 0.8 - 1.2    Comment: (NOTE) INR goal varies based on device and  disease states. Performed at [facility], [address] [place], [place] [address]   Troponin I (High Sensitivity)     Status: Abnormal   Collection Time: 07/04/22  8:58 AM  Result Value Ref Range   Troponin  I (High Sensitivity) 21 (H) <18 ng/L    Comment: (NOTE) Elevated high sensitivity troponin I (hsTnI) values and significant  changes across serial measurements may suggest ACS but many other  chronic and acute conditions are known to elevate hsTnI results.  Refer to the "Links" section for chest pain algorithms and additional  guidance. Performed at [facility], [address] [place], [place] [address]   Lactic acid, plasma     Status: None   Collection Time: 07/04/22  8:58 AM  Result Value Ref Range   Lactic Acid, Venous 1.5 0.5 - 1.9 mmol/L    Comment: Performed at [facility], [address] [place], [place] [address]  Hepatic function panel     Status: Abnormal   Collection Time: 07/04/22  8:58 AM  Result Value Ref Range   Total Protein 7.0 6.5 - 8.1 g/dL   Albumin 4.2 3.5 - 5.0 g/dL   AST 23 15 - 41 U/L   ALT 16 0 - 44 U/L   Alkaline Phosphatase 71 38 - 126 U/L   Total Bilirubin 2.1 (H) 0.3 - 1.2 mg/dL   Bilirubin, Direct 0.4 (H) 0.0 - 0.2 mg/dL   Indirect Bilirubin 1.7 (H) 0.3 - 0.9 mg/dL    Comment: Performed at [facility], [address] [place], [place] [address]  Urinalysis, Routine w reflex microscopic Urine, Unspecified Source     Status: Abnormal   Collection Time: 07/04/22  8:58 AM  Result Value Ref Range   Color, Urine YELLOW (A) YELLOW   APPearance HAZY (A) CLEAR   Specific Gravity, Urine 1.020 1.005 - 1.030   pH 6.0 5.0 - 8.0   Glucose, UA NEGATIVE NEGATIVE mg/dL   Hgb urine dipstick NEGATIVE NEGATIVE   Bilirubin Urine NEGATIVE NEGATIVE   Ketones, ur 5 (A) NEGATIVE mg/dL   Protein, ur NEGATIVE NEGATIVE mg/dL   Nitrite NEGATIVE NEGATIVE   Leukocytes,Ua NEGATIVE NEGATIVE    Comment: Performed at  [facility], [address] [place], [place] [address]  Resp Panel by RT-PCR (Flu A&B, Covid) Anterior Nasal Swab     Status: Abnormal   Collection Time: 07/04/22  8:58 AM   Specimen: Anterior Nasal Swab  Result Value Ref Range   SARS Coronavirus 2 by RT PCR POSITIVE (A) NEGATIVE    Comment: (NOTE) SARS-CoV-2 target nucleic acids are DETECTED.  The SARS-CoV-2 RNA is generally detectable in upper respiratory specimens during the acute phase of infection. Positive results are indicative of the presence of the identified virus, but do not rule out bacterial infection or co-infection with other pathogens not detected by the test. Clinical correlation with patient history and other diagnostic information is necessary to determine patient infection status. The expected result is Negative.  Fact Sheet for Patients: BloggerCourse.com  Fact Sheet for Healthcare Providers: SeriousBroker.it  This test is not yet approved or cleared by the [place] FDA and  has been authorized for detection and/or diagnosis of SARS-CoV-2 by FDA under an Emergency Use Authorization (EUA).  This EUA will remain in effect (meaning this test can be used) for the duration of  the COVID-19 declaration under Section 564(b)(1) of the A ct, 21 U.S.C. section 360bbb-3(b)(1), unless the authorization is terminated or revoked sooner.     Influenza A by PCR NEGATIVE NEGATIVE   Influenza B by PCR NEGATIVE NEGATIVE    Comment: (NOTE) The Xpert Xpress SARS-CoV-2/FLU/RSV plus assay is intended as an aid in the diagnosis of influenza from Nasopharyngeal  swab specimens and should not be used as a sole basis for treatment. Nasal washings and aspirates are unacceptable for Xpert Xpress SARS-CoV-2/FLU/RSV testing.  Fact Sheet for Patients: BloggerCourse.com  Fact Sheet for Healthcare  Providers: SeriousBroker.it  This test is not yet approved or cleared by the [place] FDA and has been authorized for detection and/or diagnosis of SARS-CoV-2 by FDA under an Emergency Use Authorization (EUA). This EUA will remain in effect (meaning this test can be used) for the duration of the COVID-19 declaration under Section 564(b)(1) of the Act, 21 U.S.C. section 360bbb-3(b)(1), unless the authorization is terminated or revoked.  Performed at [facility], [address] [place], [place] [address]   Blood gas, venous     Status: Abnormal   Collection Time: 07/04/22  8:58 AM  Result Value Ref Range   pH, Ven 7.45 (H) 7.25 - 7.43   pCO2, Ven 37 (L) 44 - 60 mmHg   pO2, Ven 64 (H) 32 - 45 mmHg   Bicarbonate 25.7 20.0 - 28.0 mmol/L   Acid-Base Excess 1.8 0.0 - 2.0 mmol/L   O2 Saturation 92.2 %   Patient temperature 37.0    Collection site VENOUS     Comment: Performed at [facility], [address] [place], [place] [address]  Urine Drug Screen, Qualitative     Status: None   Collection Time: 07/04/22  8:58 AM  Result Value Ref Range   Tricyclic, Ur Screen NONE DETECTED NONE DETECTED   Amphetamines, Ur Screen NONE DETECTED NONE DETECTED   MDMA (Ecstasy)Ur Screen NONE DETECTED NONE DETECTED   Cocaine Metabolite,Ur [place] NONE DETECTED NONE DETECTED   Opiate, Ur Screen NONE DETECTED NONE DETECTED   Phencyclidine (PCP) Ur S NONE DETECTED NONE DETECTED   Cannabinoid 50 Ng, Ur [place] NONE DETECTED NONE DETECTED   Barbiturates, Ur Screen NONE DETECTED NONE DETECTED   Benzodiazepine, Ur Scrn NONE DETECTED NONE DETECTED   Methadone Scn, Ur NONE DETECTED NONE DETECTED    Comment: (NOTE) Tricyclics + metabolites, urine    Cutoff 1000 ng/mL Amphetamines + metabolites, urine  Cutoff 1000 ng/mL MDMA (Ecstasy), urine              Cutoff 500 ng/mL Cocaine Metabolite, urine          Cutoff 300 ng/mL Opiate + metabolites, urine        Cutoff 300  ng/mL Phencyclidine (PCP), urine         Cutoff 25 ng/mL Cannabinoid, urine                 Cutoff 50 ng/mL Barbiturates + metabolites, urine  Cutoff 200 ng/mL Benzodiazepine, urine              Cutoff 200 ng/mL Methadone, urine                   Cutoff 300 ng/mL  The urine drug screen provides only a preliminary, unconfirmed analytical test result and should not be used for non-medical purposes. Clinical consideration and professional judgment should be applied to any positive drug screen result due to possible interfering substances. A more specific alternate chemical method must be used in order to obtain a confirmed analytical result. Gas chromatography / mass spectrometry (GC/MS) is the preferred confirm atory method. Performed at [facility], [address] [place], [place] [address]   Troponin I (High Sensitivity)     Status: Abnormal   Collection Time: 07/04/22 11:49 AM  Result Value Ref  Range   Troponin I (High Sensitivity) 24 (H) <18 ng/L    Comment: (NOTE) Elevated high sensitivity troponin I (hsTnI) values and significant  changes across serial measurements may suggest ACS but many other  chronic and acute conditions are known to elevate hsTnI results.  Refer to the "Links" section for chest pain algorithms and additional  guidance. Performed at [facility], [address] [place], [place] [address]   Phosphorus     Status: None   Collection Time: 07/04/22 11:49 AM  Result Value Ref Range   Phosphorus 3.2 2.5 - 4.6 mg/dL    Comment: Performed at [facility], [address] [place], [place] [address]  TSH     Status: None   Collection Time: 07/04/22 11:49 AM  Result Value Ref Range   TSH 0.615 0.350 - 4.500 uIU/mL    Comment: Performed by a 3rd Generation assay with a functional sensitivity of <=0.01 uIU/mL. Performed at [facility], [address] [place], [place] [address]   Procalcitonin - Baseline     Status:  None   Collection Time: 07/04/22 12:16 PM  Result Value Ref Range   Procalcitonin <0.10 ng/mL    Comment:        Interpretation: PCT (Procalcitonin) <= 0.5 ng/mL: Systemic infection (sepsis) is not likely. Local bacterial infection is possible. (NOTE)       Sepsis PCT Algorithm           Lower Respiratory Tract                                      Infection PCT Algorithm    ----------------------------     ----------------------------         PCT < 0.25 ng/mL                PCT < 0.10 ng/mL          Strongly encourage             Strongly discourage   discontinuation of antibiotics    initiation of antibiotics    ----------------------------     -----------------------------       PCT 0.25 - 0.50 ng/mL            PCT 0.10 - 0.25 ng/mL               OR       >80% decrease in PCT            Discourage initiation of                                            antibiotics      Encourage discontinuation           of antibiotics    ----------------------------     -----------------------------         PCT >= 0.50 ng/mL              PCT 0.26 - 0.50 ng/mL               AND        <80% decrease in PCT             Encourage initiation of  antibiotics       Encourage continuation           of antibiotics    ----------------------------     -----------------------------        PCT >= 0.50 ng/mL                  PCT > 0.50 ng/mL               AND         increase in PCT                  Strongly encourage                                      initiation of antibiotics    Strongly encourage escalation           of antibiotics                                     -----------------------------                                           PCT <= 0.25 ng/mL                                                 OR                                        > 80% decrease in PCT                                      Discontinue / Do not initiate                                              antibiotics  Performed at [facility], [address] [place], [place] [address]   Glucose, capillary     Status: Abnormal   Collection Time: 07/04/22  1:58 PM  Result Value Ref Range   Glucose-Capillary 105 (H) 70 - 99 mg/dL    Comment: Glucose reference range applies only to samples taken after fasting for at least 8 hours.  Glucose, capillary     Status: Abnormal   Collection Time: 07/04/22  5:20 PM  Result Value Ref Range   Glucose-Capillary 109 (H) 70 - 99 mg/dL    Comment: Glucose reference range applies only to samples taken after fasting for at least 8 hours.  Glucose, capillary     Status: Abnormal   Collection Time: 07/04/22  6:02 PM  Result Value Ref Range   Glucose-Capillary 131 (H) 70 - 99 mg/dL    Comment: Glucose reference range applies only to samples taken after fasting for at least 8 hours.  Glucose, capillary  Status: Abnormal   Collection Time: 07/04/22  8:15 PM  Result Value Ref Range   Glucose-Capillary 207 (H) 70 - 99 mg/dL    Comment: Glucose reference range applies only to samples taken after fasting for at least 8 hours.  Basic metabolic panel     Status: Abnormal   Collection Time: 07/05/22  6:43 AM  Result Value Ref Range   Sodium 138 135 - 145 mmol/L   Potassium 4.9 3.5 - 5.1 mmol/L   Chloride 105 98 - 111 mmol/L   CO2 29 22 - 32 mmol/L   Glucose, Bld 137 (H) 70 - 99 mg/dL    Comment: Glucose reference range applies only to samples taken after fasting for at least 8 hours.   BUN 23 8 - 23 mg/dL   Creatinine, Ser 1.611.11 0.61 - 1.24 mg/dL   Calcium 8.7 (L) 8.9 - 10.3 mg/dL   GFR, Estimated >09>60 >60>60 mL/min    Comment: (NOTE) Calculated using the CKD-EPI Creatinine Equation (2021)    Anion gap 4 (L) 5 - 15    Comment: Performed at [facility], [address] [place], [place] [address]  CBC     Status: Abnormal   Collection Time: 07/05/22  6:43 AM  Result Value Ref Range   WBC 9.1 4.0 - 10.5 K/uL    RBC 5.63 4.22 - 5.81 MIL/uL   Hemoglobin 17.1 (H) 13.0 - 17.0 g/dL   HCT 81.151.1 91.439.0 - 78.252.0 %   MCV 90.8 80.0 - 100.0 fL   MCH 30.4 26.0 - 34.0 pg   MCHC 33.5 30.0 - 36.0 g/dL   RDW 95.614.1 21.311.5 - 08.615.5 %   Platelets 119 (L) 150 - 400 K/uL   nRBC 0.0 0.0 - 0.2 %    Comment: Performed at [facility], [address] [place], [place] [address]  Glucose, capillary     Status: Abnormal   Collection Time: 07/05/22  7:46 AM  Result Value Ref Range   Glucose-Capillary 142 (H) 70 - 99 mg/dL    Comment: Glucose reference range applies only to samples taken after fasting for at least 8 hours.  Glucose, capillary     Status: Abnormal   Collection Time: 07/05/22 11:53 AM  Result Value Ref Range   Glucose-Capillary 162 (H) 70 - 99 mg/dL    Comment: Glucose reference range applies only to samples taken after fasting for at least 8 hours.   Objective  Body mass index is 28.42 kg/m. Wt Readings from Last 3 Encounters:  07/08/22 170 lb 12.8 oz (77.5 kg)  07/04/22 170 lb (77.1 kg)  04/15/22 193 lb 3.2 oz (87.6 kg)   Temp Readings from Last 3 Encounters:  07/08/22 98.3 F (36.8 C) (Oral)  07/05/22 97.8 F (36.6 C)  04/15/22 97.8 F (36.6 C) (Oral)   BP Readings from Last 3 Encounters:  07/08/22 116/64  07/05/22 (!) 121/93  04/15/22 120/80   Pulse Readings from Last 3 Encounters:  07/08/22 87  07/05/22 87  04/15/22 76    Physical Exam Vitals and nursing note reviewed.  Constitutional:      Appearance: Normal appearance. He is well-developed and well-groomed.  HENT:     Head: Normocephalic and atraumatic.  Eyes:     Conjunctiva/sclera: Conjunctivae normal.     Pupils: Pupils are equal, round, and reactive to light.  Cardiovascular:     Rate and Rhythm: Normal rate and regular rhythm.     Heart sounds: Normal heart sounds.  Pulmonary:  Effort: Pulmonary effort is normal. No respiratory distress.     Breath sounds: Normal breath sounds.  Abdominal:     Tenderness:  There is no abdominal tenderness.  Skin:    General: Skin is warm and moist.  Neurological:     General: No focal deficit present.     Mental Status: He is alert and oriented to person, place, and time. Mental status is at baseline.     Sensory: Sensation is intact.     Motor: Motor function is intact.     Coordination: Coordination is intact.     Gait: Gait is intact. Gait normal.  Psychiatric:        Attention and Perception: Attention and perception normal.        Mood and Affect: Mood and affect normal.        Speech: Speech normal.        Behavior: Behavior normal. Behavior is cooperative.        Thought Content: Thought content normal.        Cognition and Memory: Cognition and memory normal.        Judgment: Judgment normal.     Assessment  Plan  COVID-19 - Plan: molnupiravir EUA (LAGEVRIO) 200 mg CAPS capsule bid x 5 days  Mask  Supportive care   Bronchitis due to COVID-19 virus - Plan: chlorpheniramine-HYDROcodone (TUSSIONEX) 10-8 MG/5ML, Dextromethorphan-Guaifenesin 60-1200 MG 12hr tablet  Call back Friday if not better  Pulse oximeter recommended   Provider: Dr. French Ana McLean-Scocuzza-Internal Medicine

## 2022-07-08 NOTE — Patient Instructions (Addendum)
CALL ME BACK FRIDAY  PICK UP 3 MEDICATIONS AT THE PHARMACY  Wear your mask all times until 07/14/22 to 07/18/22   These are over the counter medication options:  Mucinex dm green label for cough during the day 2x per day at night Tussionex at night  Covid 19 pill 4 pills in the am and at night with food  Warm tea with honey and lemon  Sugar free cough (Hall or Ricola lemon honey)   Take the medications listed Check your oxygen  --> PULSE OXIMETER if oxygen is less than 90 please go to the hospital.  TAKE A  Multivitamin or below vitamins   Vitamin C 1000 mg daily.  Vitamin D3 4000 Iu (units) daily.  Zinc 100 mg daily.  Quercetin 250-500 mg 2 times per day   Elderberry  Oil of oregano  cepacol or chloroseptic spray Warm salt water gargles +hydrogen peroxide Sugar free cough drops  Warm tea with honey and lemon  Hydration  Try to eat though you dont feel like it   Tylenol or Advil  Nasal saline and Flonase 2 sprays nasal congestion  If sneezing/runny nose over the counter allergy pill claritin,allegra, zyrtec, xyzal Quarantine x 10-14 days 14 days preferred   Are you feeling really sick? Shortness of breath, cough, chest pain?, dizziness? Confusion   If so let me know  If worsening, go to hospital or [facility] clinic Urgent care for further treatment.

## 2022-07-19 NOTE — Progress Notes (Unsigned)
Cardiology Office Note  Date:  07/20/2022   ID:  Peter Castro, DOB: 01/24/1946, MRN: [id number]  PCP:  McLean-Scocuzza, Pasty Spillers, MD   Chief Complaint  Patient presents with   Follow up [facility]    "Doing well." Medications reviewed by the patient verbally.     HPI:  Peter Castro is a 76 y.o. male with a history of: Cerebrovascular accident (CVA) (HCC) Memory loss, speech impairment Atrial fibrillation (HCC), ablation in [place] 2015 No documented recurrence of atrial fibrillation since that time Essential hypertension Hyperlipidemia Former smoker, quit 20 years ago  History of subdural hematoma COVID x3 Who presents for his atrial fibrillation, SDH(spontaneous)/dizziness  LOV 5/23 Atrial fibrillation noted on EKG May 2023 Restarted on Eliquis Zio monitor was ordered, does not appear this was completed  Seen in the hospital end of August 2023 for " feeling bad", COVID + Reports he has had COVID 3 times Given IV fluids, atrial fibrillation rate 119 bpm MRI, no acute changes, 1). unchanged right parietal lobe cavernous malformation. 2). Unchanged left frontal lobe encephalomalacia.  In follow-up with primary care July 08, 2022, started on molnupiravir EUA (LAGEVRIO) 200 mg CAPS capsule bid x 5 days   On visit today reports he feels well, cough has resolved Denies any general malaise, feels he is back to his baseline Denies shortness of breath on exertion, no chest tightness, no leg edema no PND orthopnea  EKG personally reviewed by myself on todays visit Atrial fibrillation ventricular rate 87 bpm no significant ST-T wave changes from  Other past medical history reviewed Echo 2/22  1. Left ventricular ejection fraction, by estimation, is 55 to 60%. The  left ventricle has normal function. The left ventricle has no regional  wall motion abnormalities. Left ventricular diastolic parameters were  normal.   2. Right ventricular systolic function is normal. The right  ventricular  size is normal.   3. The mitral valve is normal in structure. Trivial mitral valve  regurgitation. No evidence of mitral stenosis.   4. The aortic valve is grossly normal. Aortic valve regurgitation is not  visualized. No aortic stenosis is present.   In hospital Positive COVID-19  PCR  Was at [facility] when he developed dizziness acutely, Had a fall, unable to exclude head trauma CT scan:  acute SDH along the falx right tentorium, no significant change on repeat CT scan,  4 mm in thickness along the right tentorium.  Adjacent right occipital subdural hemorrhage with slight increase from earlier.   Eliquis was held Concern for spontaneous bleed, not fall, there was no head trauma In hospital monitor for several days, persistent dizziness worse with sitting up in bed, movement of the head, gait instability, high fall risk  Previously seen [place] last seen 10/20/17 had cryo ballon pulm vein isolation and antral substrate modifications with cavo tricuspid isthmus flutter ablation 01/09/14 prior to this failed multiple cardioversions and failed antiarrhythmic meds last DCCV 12/12/13 then put on flecainide   Echo 08/18/13 LVEF 40-45 % mild sclerotic aortic valve, mild MR   PMH:   has a past medical history of A-fib (HCC), Arthritis, Atrial flutter (HCC), COVID-19, GERD (gastroesophageal reflux disease), History of chicken pox, Hyperlipidemia, Hypertension, Loss of memory, Stroke (HCC), Subarachnoid hemorrhage (HCC), and Subdural hematoma (HCC).  PSH:    Past Surgical History:  Procedure Laterality Date   APPENDECTOMY     OTHER SURGICAL HISTORY     cryoballon pvi isthmus tricuspid ablation Dr. Kathrynn Ducking 01/2014 [place]  Current Outpatient Medications  Medication Sig Dispense Refill   amLODipine (NORVASC) 5 MG tablet Take 1 tablet (5 mg total) by mouth daily as needed. 90 tablet 3   apixaban (ELIQUIS) 5 MG TABS tablet Take 1 tablet (5 mg total) by mouth 2 (two) times daily.  180 tablet 3   atorvastatin (LIPITOR) 10 MG tablet Take 1 tablet (10 mg total) by mouth daily at 6 PM. 90 tablet 3   carvedilol (COREG) 25 MG tablet Take 1 tablet (25 mg total) by mouth 2 (two) times daily with a meal. 180 tablet 3   chlorpheniramine-HYDROcodone (TUSSIONEX) 10-8 MG/5ML Take 5 mLs by mouth at bedtime as needed for cough. 115 mL 0   Dextromethorphan-Guaifenesin 60-1200 MG 12hr tablet Take 1 tablet by mouth every 12 (twelve) hours. Brand for patient with HTN 60 tablet 0   lisinopril (ZESTRIL) 10 MG tablet Take 1 tablet (10 mg total) by mouth daily. 90 tablet 3   No current facility-administered medications for this visit.     ALLERGIES:   Patient has no known allergies.   SOCIAL HISTORY:  The patient  reports that he has quit smoking. He has never used smokeless tobacco. He reports current alcohol use. He reports that he does not currently use drugs.   FAMILY HISTORY:   family history includes Cancer in his father; Dementia in his mother.    REVIEW OF SYSTEMS: Review of Systems  Constitutional: Negative.   Eyes: Negative.   Respiratory: Negative.    Cardiovascular: Negative.  Negative for leg swelling.  Gastrointestinal: Negative.   Genitourinary: Negative.   Musculoskeletal: Negative.   Neurological: Negative.   Psychiatric/Behavioral: Negative.    All other systems reviewed and are negative.  PHYSICAL EXAM: VS:  BP 122/82 (BP Location: Left Arm, Patient Position: Sitting, Cuff Size: Normal)   Pulse 87   Ht 5\' 7"  (1.702 m)   Wt 192 lb (87.1 kg)   SpO2 97%   BMI 30.07 kg/m  , BMI Body mass index is 30.07 kg/m. Constitutional:  oriented to person, place, and time. No distress.  HENT:  Head: Grossly normal Eyes:  no discharge. No scleral icterus.  Neck: No JVD, no carotid bruits  Cardiovascular: Irregularly irregular, no murmurs appreciated Pulmonary/Chest: Clear to auscultation bilaterally, no wheezes or rails Abdominal: Soft.  no distension.  no  tenderness.  Musculoskeletal: Normal range of motion Neurological:  normal muscle tone. Coordination normal. No atrophy Skin: Skin warm and dry Psychiatric: normal affect, pleasant  RECENT LABS: 07/04/2022: ALT 16; TSH 0.615 07/05/2022: BUN 23; Creatinine, Ser 1.11; Hemoglobin 17.1; Platelets 119; Potassium 4.9; Sodium 138    LIPID PANEL: Lab Results  Component Value Date   CHOL 152 04/15/2022   HDL 52.30 04/15/2022   LDLCALC 83 04/15/2022   TRIG 81.0 04/15/2022      WEIGHT: Wt Readings from Last 3 Encounters:  07/20/22 192 lb (87.1 kg)  07/08/22 170 lb 12.8 oz (77.5 kg)  07/04/22 170 lb (77.1 kg)      ASSESSMENT AND PLAN:  Paroxysmal atrial fibrillation (HCC) - Plan: EKG 12-Lead Noted to be in atrial fibrillation on prior clinic visit May 2023 Zio monitor was ordered but never completed Reports he is asymptomatic, Prior history of use of antiarrhythmic medications, cardioversions, ablation He is inclined to pursue rate control Recommend he stop flecainide, continue carvedilol with Eliquis 5 twice daily  Essential hypertension Blood pressure is well controlled on today's visit. No changes made to the medications.  Cerebrovascular accident (CVA), unspecified  mechanism (HCC) subdural hematoma, in the setting of a fall,  Back on Eliquis, no further complications  Mixed hyperlipidemia continue Lipitor, at goal  Subdural hematoma No symptoms, has recovered Previously evaluated by neurology Continue Eliquis  Covid + Reports having COVID 3 times, most recently in the past several weeks Back to his baseline  Total encounter time more than 30 minutes. Greater than 50% was spent in counseling and coordination of care with the patient.   Orders Placed This Encounter  Procedures   EKG 12-Lead    Signed, Dossie Arbour, M.D., Ph.D. 07/20/2022  [facility] [place], [place] [phone]

## 2022-07-20 ENCOUNTER — Encounter: Payer: Self-pay | Admitting: Cardiovascular Disease

## 2022-07-20 ENCOUNTER — Ambulatory Visit: Payer: Medicare HMO | Attending: Cardiovascular Disease | Admitting: Cardiovascular Disease

## 2022-07-20 VITALS — BP 122/82 | HR 87 | Ht 67.0 in | Wt 192.0 lb

## 2022-07-20 DIAGNOSIS — I480 Paroxysmal atrial fibrillation: Secondary | ICD-10-CM

## 2022-07-20 DIAGNOSIS — I700 Atherosclerosis of aorta: Secondary | ICD-10-CM | POA: Diagnosis not present

## 2022-07-20 DIAGNOSIS — I639 Cerebral infarction, unspecified: Secondary | ICD-10-CM | POA: Diagnosis not present

## 2022-07-20 DIAGNOSIS — I10 Essential (primary) hypertension: Secondary | ICD-10-CM

## 2022-07-20 DIAGNOSIS — E782 Mixed hyperlipidemia: Secondary | ICD-10-CM

## 2022-07-20 NOTE — Patient Instructions (Addendum)
Medication Instructions:  Your physician has recommended you make the following change in your medication:  STOP Flecainide    If you need a refill on your cardiac medications before your next appointment, please call your pharmacy.   Lab work: No new labs needed  Testing/Procedures: No new testing needed  Follow-Up: At [facility], you and your health needs are our priority.  As part of our continuing mission to provide you with exceptional heart care, we have created designated Provider Care Teams.  These Care Teams include your primary Cardiologist (physician) and Advanced Practice Providers (APPs -  Physician Assistants and Nurse Practitioners) who all work together to provide you with the care you need, when you need it.  You will need a follow up appointment in 6 months  Providers on your designated Care Team:   Nicolasa Ducking, NP Eula Listen, PA-C Cadence Fransico Michael, [place]  COVID-19 Vaccine Information can be found at: [email or web address] For questions related to vaccine distribution or appointments, please email [email or web address] or call [phone].

## 2022-10-06 ENCOUNTER — Ambulatory Visit: Payer: Medicare HMO | Admitting: Nurse Practitioner

## 2022-10-15 ENCOUNTER — Encounter: Payer: Self-pay | Admitting: Family Medicine

## 2022-10-15 ENCOUNTER — Ambulatory Visit (INDEPENDENT_AMBULATORY_CARE_PROVIDER_SITE_OTHER): Payer: Medicare HMO | Admitting: Family Medicine

## 2022-10-15 VITALS — BP 110/70 | HR 76 | Temp 98.3°F | Ht 66.0 in | Wt 197.6 lb

## 2022-10-15 DIAGNOSIS — J439 Emphysema, unspecified: Secondary | ICD-10-CM

## 2022-10-15 DIAGNOSIS — I10 Essential (primary) hypertension: Secondary | ICD-10-CM | POA: Diagnosis not present

## 2022-10-15 DIAGNOSIS — I480 Paroxysmal atrial fibrillation: Secondary | ICD-10-CM

## 2022-10-15 DIAGNOSIS — R739 Hyperglycemia, unspecified: Secondary | ICD-10-CM

## 2022-10-15 DIAGNOSIS — I700 Atherosclerosis of aorta: Secondary | ICD-10-CM

## 2022-10-15 NOTE — Progress Notes (Signed)
SUBJECTIVE:   Chief Complaint  Patient presents with   Transitions Of Care   HPI Patient presents to clinic to transfer care  No acute concerns  Hypertension Asymptomatic.  Takes carvedilol 25 mg twice daily, amlodipine 5 mg daily and lisinopril 10 mg daily.  Denies any headaches, visual changes, chest pain, shortness of breath, abdominal pain or lower extremity edema.  Hyperlipidemia Takes Lipitor 10 mg daily and tolerating medication well.  A-fib Asymptomatic.  Denies any chest pain, heart palpitations or flutters.  Most recently seen by cardiology for management of A-fib that was noted on EKG in May 2023.  He was restarted on his Eliquis.  Zio patch was ordered but was never completed at that time.  During his recent cardiology visit an EKG was performed where was noted to have A-fib with controlled rate.  His flecainide was discontinued at that time.   PERTINENT PMH / PSH: CVA with memory loss, speech impairment A-fib, status post ablation 2015 Hypertension Hyperlipidemia Previous smoker, quit 20 years ago History of subdural hematoma History of subarachnoid hemorrhage  OBJECTIVE:  BP 110/70   Pulse 76   Temp 98.3 F (36.8 C) (Oral)   Ht 5\' 6"  (1.676 m)   Wt 197 lb 9.6 oz (89.6 kg)   SpO2 95%   BMI 31.89 kg/m    Physical Exam Constitutional:      General: He is not in acute distress.    Appearance: He is normal weight. He is not ill-appearing.  HENT:     Head: Normocephalic.  Eyes:     Conjunctiva/sclera: Conjunctivae normal.  Cardiovascular:     Rate and Rhythm: Rhythm irregularly irregular.     Pulses: Normal pulses.     Heart sounds: Normal heart sounds.  Pulmonary:     Effort: Pulmonary effort is normal.     Breath sounds: Normal breath sounds.  Abdominal:     General: Bowel sounds are normal.  Neurological:     Mental Status: He is alert. Mental status is at baseline.  Psychiatric:        Mood and Affect: Mood normal.        Behavior:  Behavior normal.        Thought Content: Thought content normal.        Judgment: Judgment normal.     ASSESSMENT/PLAN:  Essential hypertension Assessment & Plan: Chronic.  Stable.  Asymptomatic.  Well-controlled on current antihypertensive medications.  Recent creatinine within normal limits Continue amlodipine 5 mg nightly Continue lisinopril 10 mg nightly Continue carvedilol 25 mg twice daily   Orders: -     Comprehensive metabolic panel; Future -     Lipid panel; Future -     TSH; Future -     VITAMIN D 25 Hydroxy (Vit-D Deficiency, Fractures); Future -     Vitamin B12; Future  Hyperglycemia -     Hemoglobin A1c; Future  Paroxysmal atrial fibrillation [facility]) Assessment & Plan: Follows with cardiology. Flecainide recently discontinued Continue carvedilol 25 mg twice daily Continue apixaban 5 mg twice daily   Orders: -     CBC with Differential/Platelet; Future  Aortic atherosclerosis (HCC) Assessment & Plan: Continue Lipitor 10 mg daily   Pulmonary emphysema, unspecified emphysema type (HCC) Assessment & Plan: Chronic.  Stable.  Asymptomatic.  Mild emphysematous changes noted on CT chest 2021.  No previous documented referral to pulmonology.     HCM Recommend Tdap Recommend Shingrix, pneumonia and RSV vaccines  PDMP reviewed  Return in about  6 months (around 04/16/2023) for annual visit with fasting labs 1 week prior.  Dana Allan, MD

## 2022-10-15 NOTE — Patient Instructions (Addendum)
It was a pleasure meeting you today. Thank you for allowing me to take part in your health care.  Our goals for today as we discussed include:  Follow up with Cardiology as scheduled  Please schedule lab appointment for 1 week prior to next annual visit. Need to fast for 12 hrs  If you have any questions or concerns, please do not hesitate to call the office at [phone].  I look forward to our next visit and until then take care and stay safe.  Regards,   Dana Allan, MD   [facility]

## 2022-10-27 ENCOUNTER — Telehealth: Payer: Self-pay | Admitting: Family Medicine

## 2022-10-27 NOTE — Telephone Encounter (Signed)
Copied from CRM [id number]. Topic: Medicare AWV >> Oct 27, 2022 11:10 AM Payton Doughty wrote: Reason for CRM: Left message for patient to schedule Annual Wellness Visit.  Please schedule with Nurse Health Advisor Babs Sciara, LPN at [facility]. This appt can be telephone or office visit.  Please call [phone] ask for [facility]

## 2022-11-02 ENCOUNTER — Encounter: Payer: Self-pay | Admitting: Family Medicine

## 2022-11-02 NOTE — Assessment & Plan Note (Signed)
-  Continue Lipitor 10mg daily

## 2022-11-02 NOTE — Assessment & Plan Note (Signed)
Chronic.  Stable.  Asymptomatic.  Well-controlled on current antihypertensive medications.  Recent creatinine within normal limits Continue amlodipine 5 mg nightly Continue lisinopril 10 mg nightly Continue carvedilol 25 mg twice daily

## 2022-11-02 NOTE — Assessment & Plan Note (Signed)
Chronic.  Stable.  Asymptomatic.  Mild emphysematous changes noted on CT chest 2021.  No previous documented referral to pulmonology.

## 2022-11-02 NOTE — Assessment & Plan Note (Signed)
Follows with cardiology. Flecainide recently discontinued Continue carvedilol 25 mg twice daily Continue apixaban 5 mg twice daily

## 2022-12-13 DIAGNOSIS — E669 Obesity, unspecified: Secondary | ICD-10-CM | POA: Diagnosis not present

## 2022-12-13 DIAGNOSIS — Z8249 Family history of ischemic heart disease and other diseases of the circulatory system: Secondary | ICD-10-CM | POA: Diagnosis not present

## 2022-12-13 DIAGNOSIS — I700 Atherosclerosis of aorta: Secondary | ICD-10-CM | POA: Diagnosis not present

## 2022-12-13 DIAGNOSIS — E785 Hyperlipidemia, unspecified: Secondary | ICD-10-CM | POA: Diagnosis not present

## 2022-12-13 DIAGNOSIS — J439 Emphysema, unspecified: Secondary | ICD-10-CM | POA: Diagnosis not present

## 2022-12-13 DIAGNOSIS — D6869 Other thrombophilia: Secondary | ICD-10-CM | POA: Diagnosis not present

## 2022-12-13 DIAGNOSIS — Z9181 History of falling: Secondary | ICD-10-CM | POA: Diagnosis not present

## 2022-12-13 DIAGNOSIS — N529 Male erectile dysfunction, unspecified: Secondary | ICD-10-CM | POA: Diagnosis not present

## 2022-12-13 DIAGNOSIS — I2510 Atherosclerotic heart disease of native coronary artery without angina pectoris: Secondary | ICD-10-CM | POA: Diagnosis not present

## 2022-12-13 DIAGNOSIS — K219 Gastro-esophageal reflux disease without esophagitis: Secondary | ICD-10-CM | POA: Diagnosis not present

## 2022-12-13 DIAGNOSIS — E119 Type 2 diabetes mellitus without complications: Secondary | ICD-10-CM | POA: Diagnosis not present

## 2022-12-13 DIAGNOSIS — I10 Essential (primary) hypertension: Secondary | ICD-10-CM | POA: Diagnosis not present

## 2022-12-28 ENCOUNTER — Other Ambulatory Visit: Payer: Self-pay

## 2022-12-28 ENCOUNTER — Emergency Department: Payer: Medicare HMO

## 2022-12-28 ENCOUNTER — Encounter: Payer: Self-pay | Admitting: Emergency Medicine

## 2022-12-28 ENCOUNTER — Emergency Department
Admission: EM | Admit: 2022-12-28 | Discharge: 2022-12-28 | Disposition: A | Discharge status: Home / Self Care | Payer: Medicare HMO | Attending: Student in an Organized Health Care Education/Training Program | Admitting: Student in an Organized Health Care Education/Training Program

## 2022-12-28 DIAGNOSIS — M79605 Pain in left leg: Secondary | ICD-10-CM

## 2022-12-28 DIAGNOSIS — Z8673 Personal history of transient ischemic attack (TIA), and cerebral infarction without residual deficits: Secondary | ICD-10-CM | POA: Insufficient documentation

## 2022-12-28 DIAGNOSIS — M79662 Pain in left lower leg: Secondary | ICD-10-CM | POA: Insufficient documentation

## 2022-12-28 DIAGNOSIS — I10 Essential (primary) hypertension: Secondary | ICD-10-CM | POA: Diagnosis not present

## 2022-12-28 DIAGNOSIS — Z7901 Long term (current) use of anticoagulants: Secondary | ICD-10-CM | POA: Diagnosis not present

## 2022-12-28 DIAGNOSIS — S8992XA Unspecified injury of left lower leg, initial encounter: Secondary | ICD-10-CM | POA: Diagnosis not present

## 2022-12-28 MED ORDER — LIDOCAINE 5 % EX PTCH
1.0000 | MEDICATED_PATCH | CUTANEOUS | Status: DC
Start: 2022-12-28 — End: 2022-12-28
  Administered 2022-12-28: 1 via TRANSDERMAL
  Filled 2022-12-28: qty 1

## 2022-12-28 MED ORDER — ACETAMINOPHEN 325 MG PO TABS
650.0000 mg | ORAL_TABLET | Freq: Once | ORAL | Status: AC
Start: 2022-12-28 — End: 2022-12-28
  Administered 2022-12-28: 650 mg via ORAL
  Filled 2022-12-28: qty 2

## 2022-12-28 NOTE — ED Provider Notes (Signed)
[facility] Provider Note    Event Date/Time   First MD Initiated Contact with Patient 12/28/22 [phone]     (approximate)   History   Leg Pain (Pt. To ED via POV for left lower leg pain x2 days. Pt. Denies injury/trauma. Pt. Denies SOB/CP. Pt. Takes eliquis, previous stroke.)   HPI  Peter Castro is a 77 y.o. male who presents today for evaluation of left shin pain.  Patient reports that this has been ongoing for the past couple of days.  He reports that it hurts when he pushes on it.  He is able to ambulate.  He has not noticed any swelling there.  He denies any injuries or trauma to the area.  He denies numbness or tingling.  He denies foot pain or knee pain.  No pain in his upper leg or hip.  He denies back pain.  No weakness or paresthesias.  No fevers or chills.  He has not noticed any skin changes.  Patient Active Problem List   Diagnosis Date Noted   Fall 12/10/2020   GERD (gastroesophageal reflux disease) 12/10/2020   Aortic atherosclerosis ([place]) 10/14/2020   Emphysema lung ([place]) 10/14/2020   Hiatal hernia 10/14/2020   Elevated PSA 11/02/2019   Atrial fibrillation ([place]) 12/08/2018   Essential hypertension 12/08/2018   Hyperlipidemia 12/08/2018          Physical Exam   Triage Vital Signs: ED Triage Vitals  Enc Vitals Group     BP 12/28/22 0930 (!) 142/94     Pulse Rate 12/28/22 0930 89     Resp 12/28/22 0930 18     Temp 12/28/22 0930 97.8 F (36.6 C)     Temp Source 12/28/22 0930 Oral     SpO2 12/28/22 0930 97 %     Weight 12/28/22 0931 190 lb (86.2 kg)     Height 12/28/22 0931 5' 6"$  (1.676 m)     Head Circumference --      Peak Flow --      Pain Score 12/28/22 0932 6     Pain Loc --      Pain Edu? --      Excl. in [place]? --     Most recent vital signs: Vitals:   12/28/22 0930 12/28/22 1056  BP: (!) 142/94 138/88  Pulse: 89 80  Resp: 18 18  Temp: 97.8 F (36.6 C)   SpO2: 97% 98%    Physical Exam Vitals and nursing  note reviewed.  Constitutional:      General: Awake and alert. No acute distress.    Appearance: Normal appearance. The patient is normal weight.  HENT:     Head: Normocephalic and atraumatic.     Mouth: Mucous membranes are moist.  Eyes:     General: PERRL. Normal EOMs        Right eye: No discharge.        Left eye: No discharge.     Conjunctiva/sclera: Conjunctivae normal.  Cardiovascular:     Rate and Rhythm: Normal rate and regular rhythm.     Pulses: Normal pulses.  Pulmonary:     Effort: Pulmonary effort is normal. No respiratory distress.     Breath sounds: Normal breath sounds.  Abdominal:     Abdomen is soft. There is no abdominal tenderness. No rebound or guarding. No distention. Musculoskeletal:        General: No swelling. Normal range of motion.     Cervical back: Normal range of  motion and neck supple.  Left lower extremity: No deformities, skin changes, swelling, or wounds noted.  He has tenderness to palpation along his anterior and lateral shin without swelling, ecchymosis, or erythema noted.  There is no crepitus.  He has no posterior tenderness.  He has full and normal range of motion at the hip, knee, foot without warmth or effusion.  No ligamental laxity appreciated.  Compartments are soft compressible throughout.  He has bounding pedal pulses, equal bilaterally, normal capillary refill.  Sensation intact light touch throughout leg. No vertebral tenderness.  Negative straight leg raise Skin:    General: Skin is warm and dry.     Capillary Refill: Capillary refill takes less than 2 seconds.     Findings: No rash.  Neurological:     Mental Status: The patient is awake and alert.      ED Results / Procedures / Treatments   Labs (all labs ordered are listed, but only abnormal results are displayed) Labs Reviewed - No data to display   EKG     RADIOLOGY I independently reviewed and interpreted imaging and agree with radiologists  findings.     PROCEDURES:  Critical Care performed:   Procedures   MEDICATIONS ORDERED IN ED: Medications  lidocaine (LIDODERM) 5 % 1 patch (1 patch Transdermal Patch Applied 12/28/22 1037)  acetaminophen (TYLENOL) tablet 650 mg (650 mg Oral Given 12/28/22 1036)     IMPRESSION / MDM / ASSESSMENT AND PLAN / ED COURSE  I reviewed the triage vital signs and the nursing notes.   Differential diagnosis includes, but is not limited to, musculoskeletal injury, DVT, shinsplints, osseous injury.  Patient is awake and alert, hemodynamically stable and afebrile.  He has bounding 2+ pedal pulses, normal and intact strength and sensation, and normal capillary refill.  Not consistent with vascular occlusion.  His foot is warm and well-perfused.  There is no erythema, swelling, wound, warmth to suggest infectious etiology.  He has no vertebral tenderness, negative straight leg raise, I do not suspect radicular symptoms today.  Compartments are soft and compressible, I do not suspect a compartment syndrome.  X-rays negative for any acute osseous findings.  Ultrasound is negative for DVT.  Given his very normal exam, and the fact that his pain resolved completely with Tylenol and a Lidoderm patch, I suspect likely possible musculoskeletal etiology.  He is ambulatory to steady gait.  We discussed return precautions and the importance of close outpatient follow-up.  Patient understands and agrees with plan.  He was discharged in stable condition.  Patient's presentation is most consistent with acute complicated illness / injury requiring diagnostic workup.      FINAL CLINICAL IMPRESSION(S) / ED DIAGNOSES   Final diagnoses:  Left leg pain     Rx / DC Orders   ED Discharge Orders     None        Note:  This document was prepared using Dragon voice recognition software and may include unintentional dictation errors.   Emeline Gins 12/28/22 1124    Merlyn Lot,  MD 12/28/22 1139

## 2022-12-28 NOTE — ED Notes (Signed)
See triage note Presents with pain to left lower leg  states pain started 2 days ago w/o injury  Also has had similar pain for the past year  No swelling or redness noted  Not tender on palpation

## 2022-12-28 NOTE — ED Triage Notes (Signed)
Pt. To ED via POV for left lower leg pain x2 days. Pt. Denies injury/trauma. Pt. Denies SOB/CP. Pt. Takes eliquis, previous stroke.

## 2022-12-28 NOTE — Discharge Instructions (Signed)
Your x-ray and ultrasound are normal.  Please follow-up with your outpatient provider this week.  Please return for any new, worsening, or change in symptoms or other concerns.  It was a pleasure caring for you today.

## 2023-01-04 ENCOUNTER — Telehealth: Payer: Self-pay

## 2023-01-04 NOTE — Telephone Encounter (Signed)
        Patient  visited [place] on 2/19    Telephone encounter attempt :  1st  A HIPAA compliant voice message was left requesting a return call.  Instructed patient to call back    [facility] [phone] [address], [place], [place] [address] Phone: [phone] Email: [email or web address]

## 2023-01-05 ENCOUNTER — Telehealth: Payer: Self-pay

## 2023-01-05 NOTE — Telephone Encounter (Signed)
        Patient  visited [place] on 2/19    Telephone encounter attempt :  2nd  A HIPAA compliant voice message was left requesting a return call.  Instructed patient to call back    [place] [phone] [address], [place], [place] [address] Phone: [phone] Email: [email or web address]

## 2023-01-08 ENCOUNTER — Telehealth: Payer: Self-pay | Admitting: Family Medicine

## 2023-01-08 NOTE — Telephone Encounter (Signed)
Contacted Peter Castro to schedule their annual wellness visit. Appointment made for 01/14/2023.  Thank you,  [place] Direct dial  [phone]

## 2023-01-14 ENCOUNTER — Telehealth: Payer: Self-pay

## 2023-01-14 ENCOUNTER — Ambulatory Visit: Payer: Medicare HMO

## 2023-01-14 NOTE — Telephone Encounter (Signed)
Unable to reach patient when called for scheduled AWV.  No answer. 3 attempts. Unable to leave message. Okay to reschedule.

## 2023-01-17 NOTE — Progress Notes (Unsigned)
Cardiology Office Note  Date:  01/18/2023   ID:  Arless Burggraf, DOB: June 18, 1946, MRN: [id number]  PCP:  Carollee Leitz, MD   Chief Complaint  Patient presents with   6 month follow up     Patient is moving back to [place]. Medications reviewed by the patient verbally. "Doing well."     HPI:  Mr. Simard is a 77 y.o. male with a history of: Cerebrovascular accident (CVA) ([place]) Memory loss, speech impairment Atrial fibrillation ([place]), ablation in [place] 2015 No documented recurrence of atrial fibrillation since that time Essential hypertension Hyperlipidemia Former smoker, quit 20 years ago  History of subdural hematoma COVID x3 2/22: Left ventricular ejection fraction is 55 to 60%  Who presents for his atrial fibrillation, SDH(spontaneous)/dizziness  LOV 9/23 Feels well, Travelling to [place] on a frequent basis, daughter lives there Thinking of moving to [place] permanently next month Son lives local  4x a week does exercise, goes to gym  Asymptomatic from his atrial fibrillation, reports compliance with his carvedilol, Eliquis twice daily No lower extremity edema, no PND orthopnea  EKG personally reviewed by myself on todays visit Atrial fibrillation ventricular rate 76 bpm no significant ST-T wave changes  Other past medical history reviewed Atrial fibrillation noted on EKG May 2023 Restarted on Eliquis Zio monitor was ordered, does not appear this was completed  Seen in the hospital end of August 2023 for " feeling bad", COVID + Reports he has had COVID 3 times Given IV fluids, atrial fibrillation rate 119 bpm MRI, no acute changes, 1). unchanged right parietal lobe cavernous malformation. 2). Unchanged left frontal lobe encephalomalacia.  In follow-up with primary care July 08, 2022, started on molnupiravir EUA (LAGEVRIO) 200 mg CAPS capsule bid x 5 days   Echo 2/22  1. Left ventricular ejection fraction, by estimation, is 55 to 60%. The  left  ventricle has normal function. The left ventricle has no regional  wall motion abnormalities. Left ventricular diastolic parameters were  normal.   2. Right ventricular systolic function is normal. The right ventricular  size is normal.   3. The mitral valve is normal in structure. Trivial mitral valve  regurgitation. No evidence of mitral stenosis.   4. The aortic valve is grossly normal. Aortic valve regurgitation is not  visualized. No aortic stenosis is present.   In hospital Positive COVID-19  PCR  Was at [facility] when he developed dizziness acutely, Had a fall, unable to exclude head trauma CT scan:  acute SDH along the falx right tentorium, no significant change on repeat CT scan,  4 mm in thickness along the right tentorium.  Adjacent right occipital subdural hemorrhage with slight increase from earlier.   Eliquis was held Concern for spontaneous bleed, not fall, there was no head trauma In hospital monitor for several days, persistent dizziness worse with sitting up in bed, movement of the head, gait instability, high fall risk  Previously seen in [place] last seen 10/20/17 had cryo ballon pulm vein isolation and antral substrate modifications with cavo tricuspid isthmus flutter ablation 01/09/14 prior to this failed multiple cardioversions and failed antiarrhythmic meds last DCCV 12/12/13 then put on flecainide   Echo 08/18/13 LVEF 40-45 % mild sclerotic aortic valve, mild MR  PMH:   has a past medical history of A-fib ([place]), Altered mental status (07/04/2022), Annual physical exam (04/11/2021), Arthritis, Atrial flutter ([place]), Cerebrovascular accident (CVA) ([place]) (12/08/2018), COVID-19, COVID-19 virus infection (07/04/2022), Dizziness (12/10/2020), GERD (gastroesophageal reflux disease), History of chicken pox, Hyperlipidemia,  Hypertension, Left rib fracture (10/14/2020), Loss of memory, Memory loss (12/08/2018), Paroxysmal atrial fibrillation with RVR ([place]) (07/04/2022), SAH  (subarachnoid hemorrhage) ([place]) (12/10/2020), SDH (subdural hematoma) ([place]) (12/10/2020), Stroke [facility]), Stroke ([place]) (12/10/2020), Subarachnoid hemorrhage ([place]), and Subdural hematoma ([place]).  PSH:    Past Surgical History:  Procedure Laterality Date   APPENDECTOMY     OTHER SURGICAL HISTORY     cryoballon pvi isthmus tricuspid ablation Dr. Bearl Mulberry 01/2014 [place]     Current Outpatient Medications  Medication Sig Dispense Refill   amLODipine (NORVASC) 5 MG tablet Take 1 tablet (5 mg total) by mouth daily as needed. 90 tablet 3   apixaban (ELIQUIS) 5 MG TABS tablet Take 1 tablet (5 mg total) by mouth 2 (two) times daily. 180 tablet 3   atorvastatin (LIPITOR) 10 MG tablet Take 1 tablet (10 mg total) by mouth daily at 6 PM. 90 tablet 3   carvedilol (COREG) 25 MG tablet Take 1 tablet (25 mg total) by mouth 2 (two) times daily with a meal. 180 tablet 3   lisinopril (ZESTRIL) 10 MG tablet Take 1 tablet (10 mg total) by mouth daily. 90 tablet 3   No current facility-administered medications for this visit.     ALLERGIES:   Patient has no known allergies.   SOCIAL HISTORY:  The patient  reports that he has quit smoking. He has never used smokeless tobacco. He reports current alcohol use. He reports that he does not currently use drugs.   FAMILY HISTORY:   family history includes Cancer in his father; Dementia in his mother.    REVIEW OF SYSTEMS: Review of Systems  Constitutional: Negative.   Eyes: Negative.   Respiratory: Negative.    Cardiovascular: Negative.  Negative for leg swelling.  Gastrointestinal: Negative.   Genitourinary: Negative.   Musculoskeletal: Negative.   Neurological: Negative.   Psychiatric/Behavioral: Negative.    All other systems reviewed and are negative.  PHYSICAL EXAM: VS:  BP 120/80 (BP Location: Left Arm, Patient Position: Sitting, Cuff Size: Normal)   Pulse 76   Ht '5\' 6"'$  (1.676 m)   Wt 196 lb 2 oz (89 kg)   SpO2 97%   BMI 31.66 kg/m  , BMI Body  mass index is 31.66 kg/m. Constitutional:  oriented to person, place, and time. No distress.  HENT:  Head: Grossly normal Eyes:  no discharge. No scleral icterus.  Neck: No JVD, no carotid bruits  Cardiovascular: Irregularly irregular,  no murmurs appreciated Pulmonary/Chest: Clear to auscultation bilaterally, no wheezes or rails Abdominal: Soft.  no distension.  no tenderness.  Musculoskeletal: Normal range of motion Neurological:  normal muscle tone. Coordination normal. No atrophy Skin: Skin warm and dry Psychiatric: normal affect, pleasant  RECENT LABS: 07/04/2022: ALT 16; TSH 0.615 07/05/2022: BUN 23; Creatinine, Ser 1.11; Hemoglobin 17.1; Platelets 119; Potassium 4.9; Sodium 138    LIPID PANEL: Lab Results  Component Value Date   CHOL 152 04/15/2022   HDL 52.30 04/15/2022   LDLCALC 83 04/15/2022   TRIG 81.0 04/15/2022      WEIGHT: Wt Readings from Last 3 Encounters:  01/18/23 196 lb 2 oz (89 kg)  12/28/22 190 lb (86.2 kg)  10/15/22 197 lb 9.6 oz (89.6 kg)     ASSESSMENT AND PLAN:  Paroxysmal atrial fibrillation (H[facility])  Noted to be in atrial fibrillation on prior clinic visit May 2023 Zio monitor was ordered but never completed  asymptomatic, continue carvedilol for rate control Eliquis 5 twice daily Flecainide previously held after he  decided to pursue rate control rather than rhythm control Prior history of use of antiarrhythmic medications, cardioversions, ablation  Essential hypertension Blood pressure is well controlled on today's visit. No changes made to the medications.  Cerebrovascular accident (CVA), unspecified mechanism ([place]) subdural hematoma, in the setting of a fall at [facility] Stable on Eliquis, no further complications  Mixed hyperlipidemia continue Lipitor, at goal   Subdural hematoma No symptoms, has recovered Previously evaluated by neurology Continue Eliquis 5 twice daily  Covid + Reports having COVID 3 times,  Back to his  baseline  Discussed potential moved to [facility] Total encounter time more than 30 minutes. Greater than 50% was spent in counseling and coordination of care with the patient.   Orders Placed This Encounter  Procedures   EKG 12-Lead    Signed, Esmond Plants, M.D., Ph.D. 01/18/2023  [place], [place]

## 2023-01-18 ENCOUNTER — Ambulatory Visit: Payer: Medicare HMO | Attending: Cardiovascular Disease | Admitting: Cardiovascular Disease

## 2023-01-18 ENCOUNTER — Encounter: Payer: Self-pay | Admitting: Cardiovascular Disease

## 2023-01-18 VITALS — BP 120/80 | HR 76 | Ht 66.0 in | Wt 196.1 lb

## 2023-01-18 DIAGNOSIS — I480 Paroxysmal atrial fibrillation: Secondary | ICD-10-CM

## 2023-01-18 DIAGNOSIS — J439 Emphysema, unspecified: Secondary | ICD-10-CM | POA: Diagnosis not present

## 2023-01-18 DIAGNOSIS — S065XAS Traumatic subdural hemorrhage with loss of consciousness status unknown, sequela: Secondary | ICD-10-CM

## 2023-01-18 DIAGNOSIS — I10 Essential (primary) hypertension: Secondary | ICD-10-CM | POA: Diagnosis not present

## 2023-01-18 DIAGNOSIS — I639 Cerebral infarction, unspecified: Secondary | ICD-10-CM | POA: Diagnosis not present

## 2023-01-18 DIAGNOSIS — I4891 Unspecified atrial fibrillation: Secondary | ICD-10-CM | POA: Diagnosis not present

## 2023-01-18 DIAGNOSIS — E782 Mixed hyperlipidemia: Secondary | ICD-10-CM

## 2023-01-18 DIAGNOSIS — I700 Atherosclerosis of aorta: Secondary | ICD-10-CM | POA: Diagnosis not present

## 2023-01-18 DIAGNOSIS — S065XAA Traumatic subdural hemorrhage with loss of consciousness status unknown, initial encounter: Secondary | ICD-10-CM

## 2023-01-18 MED ORDER — ATORVASTATIN CALCIUM 10 MG PO TABS: 10.0000 mg | ORAL_TABLET | Freq: Every day | ORAL | 3 refills | Status: AC

## 2023-01-18 MED ORDER — LISINOPRIL 10 MG PO TABS: 10.0000 mg | ORAL_TABLET | Freq: Every day | ORAL | 3 refills | Status: AC

## 2023-01-18 MED ORDER — AMLODIPINE BESYLATE 5 MG PO TABS: 5.0000 mg | ORAL_TABLET | Freq: Every day | ORAL | 3 refills | Status: AC | PRN

## 2023-01-18 MED ORDER — CARVEDILOL 25 MG PO TABS: 25.0000 mg | ORAL_TABLET | Freq: Two times a day (BID) | ORAL | 3 refills | Status: AC

## 2023-01-18 NOTE — Patient Instructions (Signed)
Medication Instructions:  No changes  If you need a refill on your cardiac medications before your next appointment, please call your pharmacy.   Lab work: No new labs needed  Testing/Procedures: No new testing needed  Follow-Up: At [facility], you and your health needs are our priority.  As part of our continuing mission to provide you with exceptional heart care, we have created designated Provider Care Teams.  These Care Teams include your primary Cardiologist (physician) and Advanced Practice Providers (APPs -  Physician Assistants and Nurse Practitioners) who all work together to provide you with the care you need, when you need it.  You will need a follow up appointment in 12 months  Providers on your designated Care Team:   Christopher Berge, NP Ryan Dunn, PA-C Cadence Furth, PA-C  COVID-19 Vaccine Information can be found at: [email or web address] For questions related to vaccine distribution or appointments, please email [email or web address] or call [phone].   

## 2023-02-10 ENCOUNTER — Telehealth: Payer: Self-pay | Admitting: Family Medicine

## 2023-02-10 NOTE — Telephone Encounter (Signed)
Pt walked in staying that he's moving to [place] and would like to cancels his appt. All his appts are cancels now.

## 2023-04-14 ENCOUNTER — Other Ambulatory Visit: Payer: Medicare HMO

## 2023-04-20 ENCOUNTER — Encounter: Payer: Medicare HMO | Admitting: Family Medicine

## 2024-05-10 ENCOUNTER — Encounter: Payer: Self-pay | Admitting: Cardiovascular Disease
